# Patient Record
Sex: Female | Born: 1975 | Race: White | Hispanic: No | Marital: Married | Smoking: Former smoker
Health system: Southern US, Community
[De-identification: ages and names within clinical notes are randomized; demographics above are authoritative.]

## PROBLEM LIST (undated history)

## (undated) DIAGNOSIS — C50919 Malignant neoplasm of unspecified site of unspecified female breast: Secondary | ICD-10-CM

## (undated) DIAGNOSIS — R87619 Unspecified abnormal cytological findings in specimens from cervix uteri: Secondary | ICD-10-CM

## (undated) DIAGNOSIS — M1990 Unspecified osteoarthritis, unspecified site: Secondary | ICD-10-CM

## (undated) DIAGNOSIS — A64 Unspecified sexually transmitted disease: Secondary | ICD-10-CM

## (undated) DIAGNOSIS — F32A Depression, unspecified: Secondary | ICD-10-CM

## (undated) DIAGNOSIS — C801 Malignant (primary) neoplasm, unspecified: Secondary | ICD-10-CM

## (undated) DIAGNOSIS — R32 Unspecified urinary incontinence: Secondary | ICD-10-CM

## (undated) DIAGNOSIS — F329 Major depressive disorder, single episode, unspecified: Secondary | ICD-10-CM

## (undated) DIAGNOSIS — J189 Pneumonia, unspecified organism: Secondary | ICD-10-CM

## (undated) DIAGNOSIS — Z8719 Personal history of other diseases of the digestive system: Secondary | ICD-10-CM

## (undated) DIAGNOSIS — F419 Anxiety disorder, unspecified: Secondary | ICD-10-CM

## (undated) DIAGNOSIS — J45909 Unspecified asthma, uncomplicated: Secondary | ICD-10-CM

## (undated) DIAGNOSIS — K219 Gastro-esophageal reflux disease without esophagitis: Secondary | ICD-10-CM

## (undated) HISTORY — DX: Unspecified urinary incontinence: R32

## (undated) HISTORY — DX: Malignant neoplasm of unspecified site of unspecified female breast: C50.919

## (undated) HISTORY — PX: CERVICAL BIOPSY  W/ LOOP ELECTRODE EXCISION: SUR135

## (undated) HISTORY — PX: BREAST RECONSTRUCTION: SHX9

## (undated) HISTORY — DX: Unspecified abnormal cytological findings in specimens from cervix uteri: R87.619

## (undated) HISTORY — DX: Depression, unspecified: F32.A

## (undated) HISTORY — PX: TOTAL VAGINAL HYSTERECTOMY: SHX2548

## (undated) HISTORY — DX: Major depressive disorder, single episode, unspecified: F32.9

## (undated) HISTORY — PX: COLPOSCOPY: SHX161

## (undated) HISTORY — DX: Unspecified sexually transmitted disease: A64

---

## 2011-12-08 DIAGNOSIS — B977 Papillomavirus as the cause of diseases classified elsewhere: Secondary | ICD-10-CM | POA: Insufficient documentation

## 2013-06-09 DIAGNOSIS — C50919 Malignant neoplasm of unspecified site of unspecified female breast: Secondary | ICD-10-CM

## 2013-06-09 HISTORY — DX: Malignant neoplasm of unspecified site of unspecified female breast: C50.919

## 2014-06-09 DIAGNOSIS — R87619 Unspecified abnormal cytological findings in specimens from cervix uteri: Secondary | ICD-10-CM

## 2014-06-09 HISTORY — DX: Unspecified abnormal cytological findings in specimens from cervix uteri: R87.619

## 2015-06-10 HISTORY — PX: MASTECTOMY: SHX3

## 2016-02-08 DIAGNOSIS — Z90710 Acquired absence of both cervix and uterus: Secondary | ICD-10-CM | POA: Insufficient documentation

## 2017-10-29 DIAGNOSIS — C50919 Malignant neoplasm of unspecified site of unspecified female breast: Secondary | ICD-10-CM | POA: Insufficient documentation

## 2017-10-29 DIAGNOSIS — Z1502 Genetic susceptibility to malignant neoplasm of ovary: Secondary | ICD-10-CM

## 2017-10-29 DIAGNOSIS — Z1509 Genetic susceptibility to other malignant neoplasm: Secondary | ICD-10-CM

## 2017-10-29 DIAGNOSIS — Z1589 Genetic susceptibility to other disease: Secondary | ICD-10-CM

## 2017-11-19 DIAGNOSIS — K760 Fatty (change of) liver, not elsewhere classified: Secondary | ICD-10-CM | POA: Insufficient documentation

## 2017-11-19 DIAGNOSIS — E669 Obesity, unspecified: Secondary | ICD-10-CM | POA: Insufficient documentation

## 2017-11-30 DIAGNOSIS — J309 Allergic rhinitis, unspecified: Secondary | ICD-10-CM | POA: Insufficient documentation

## 2017-11-30 DIAGNOSIS — R12 Heartburn: Secondary | ICD-10-CM | POA: Insufficient documentation

## 2018-02-25 LAB — TSH: TSH: 2.34 (ref 0.41–5.90)

## 2018-02-25 LAB — HEMOGLOBIN A1C: Hemoglobin A1C: 5.1

## 2018-05-10 ENCOUNTER — Encounter: Payer: Self-pay | Admitting: "Endocrinology

## 2018-05-10 ENCOUNTER — Ambulatory Visit (INDEPENDENT_AMBULATORY_CARE_PROVIDER_SITE_OTHER): Payer: 59 | Admitting: "Endocrinology

## 2018-05-10 VITALS — BP 131/83 | HR 80 | Ht 68.5 in | Wt 252.0 lb

## 2018-05-10 DIAGNOSIS — E049 Nontoxic goiter, unspecified: Secondary | ICD-10-CM | POA: Diagnosis not present

## 2018-05-10 NOTE — Progress Notes (Signed)
Endocrinology Consult Note                                            05/10/2018, 10:38 AM   Subjective:    Patient ID: Linda Gutierrez, female    DOB: 1975-12-25, PCP Loman Brooklyn, FNP   Past Medical History:  Diagnosis Date  . Breast cancer [facility])    Past Surgical History:  Procedure Laterality Date  . BREAST RECONSTRUCTION    . MASTECTOMY    . TOTAL VAGINAL HYSTERECTOMY     Social History   Socioeconomic History  . Marital status: Married    Spouse name: Not on file  . Number of children: Not on file  . Years of education: Not on file  . Highest education level: Not on file  Occupational History  . Not on file  Social Needs  . Financial resource strain: Not on file  . Food insecurity:    Worry: Not on file    Inability: Not on file  . Transportation needs:    Medical: Not on file    Non-medical: Not on file  Tobacco Use  . Smoking status: Former Smoker    Packs/day: 0.50    Years: 10.00    Pack years: 5.00    Types: Cigarettes    Last attempt to quit: 2015    Years since quitting: 4.9  . Smokeless tobacco: Never Used  Substance and Sexual Activity  . Alcohol use: Yes    Comment: ocassional  . Drug use: Never  . Sexual activity: Not on file  Lifestyle  . Physical activity:    Days per week: Not on file    Minutes per session: Not on file  . Stress: Not on file  Relationships  . Social connections:    Talks on phone: Not on file    Gets together: Not on file    Attends religious service: Not on file    Active member of club or organization: Not on file    Attends meetings of clubs or organizations: Not on file    Relationship status: Not on file  Other Topics Concern  . Not on file  Social History Narrative  . Not on file   Outpatient Encounter Medications as of 05/10/2018  Medication Sig  . sertraline (ZOLOFT) 50 MG tablet Take by mouth daily.   No facility-administered encounter medications on file as of 05/10/2018.     ALLERGIES: No Known Allergies  VACCINATION STATUS:  There is no immunization history on file for this patient.  HPI Linda Gutierrez is 42 y.o. female who presents today with a medical history as above. she is being seen in consultation for abnormal thyroid ultrasound requested by Loman Brooklyn, FNP.  she reports that approximately 6 weeks ago she did have anterior lower neck pain associated with tenderness all over the thyroid area.  She underwent thyroid function test which were within normal limits.  On March 05, 2018 she underwent thyroid ultrasound which showed mild goiter with heterogeneous background of the thyroid with no discrete nodules.   -She did not require any steroid intervention for neck pain which resolved over a period  of time. She has family history of thyroid dysfunction.  She denies dysphagia, shortness of breath, nor voice change.  She denies palpitations, tremors, heat/cold intolerance.    Review of  Systems  Constitutional: + steady  weight, no fatigue, no subjective hyperthermia, no subjective hypothermia Eyes: no blurry vision, no xerophthalmia ENT: no sore throat, no nodules palpated in throat, no dysphagia/odynophagia, no hoarseness Cardiovascular: no Chest Pain, no Shortness of Breath, no palpitations, no leg swelling Respiratory: no cough, no SOB Gastrointestinal: no Nausea/Vomiting/Diarhhea Musculoskeletal: no muscle/joint aches Skin: no rashes Neurological: no tremors, no numbness, no tingling, no dizziness Psychiatric: no depression, no anxiety  Objective:    BP 131/83   Pulse 80   Ht 5' 8.5" (1.74 m)   Wt 252 lb (114.3 kg)   BMI 37.76 kg/m   Wt Readings from Last 3 Encounters:  05/10/18 252 lb (114.3 kg)    Physical Exam  Constitutional:  + Obese for height, not in acute distress, normal state of mind Eyes: PERRLA, EOMI, no exophthalmos ENT: moist mucous membranes, no thyromegaly, no cervical lymphadenopathy Cardiovascular: normal  precordial activity, Regular Rate and Rhythm, no Murmur/Rubs/Gallops Respiratory:  adequate breathing efforts, no gross chest deformity, Clear to auscultation bilaterally Gastrointestinal: abdomen soft, Non -tender, No distension, Bowel Sounds present Musculoskeletal: no gross deformities, strength intact in all four extremities Skin: moist, warm, no rashes Neurological: no tremor with outstretched hands, Deep tendon reflexes normal in all four extremities.   February 25, 2018 labs: Free T4 1.3 2, TSH 2.3 4, free T3 3.1 A1c 5.1%       Assessment & Plan:   1. Goiter  - Linda Gutierrez  is being seen at a kind request of Loman Brooklyn, FNP. - I have reviewed her available thyroid records and clinically evaluated the patient. - Based on reviews, she has mild goiter with heterogeneous echotexture.  She likely had subacute thyroiditis which has resolved. -She will not require antithyroid intervention at this time.  -She will need repeat thyroid function test and office visit in 6 months.  If next physical exam reveals enlarging goiter, she will be considered for repeat thyroid sonogram a year from the last one.  -She has A1c of 5.1% indicating absence of prediabetes/diabetes. - I did not initiate any new prescriptions today.  - I advised her  to maintain close follow up with Loman Brooklyn, FNP for primary care needs.   - Time spent with the patient: 25 minutes, of which >50% was spent in obtaining information about her symptoms, reviewing her previous labs, evaluations, and treatments, counseling her about her abnormal thyroid ultrasound, and developing a plan on how to follow-up.   Hera Ortlieb participated in the discussions, expressed understanding, and voiced agreement with the above plans.  All questions were answered to her satisfaction. she is encouraged to contact clinic should she have any questions or concerns prior to her return visit.  Follow up plan: Return in  about 6 months (around 11/09/2018) for Follow up with Pre-visit Labs.   [name], MD [facility] [address] [place], [place] [address] Phone: [phone]  Fax: [phone]     05/10/2018, 10:38 AM  This note was partially dictated with voice recognition software. Similar sounding words can be transcribed inadequately or may not  be corrected upon review.

## 2018-06-07 ENCOUNTER — Other Ambulatory Visit: Payer: Self-pay

## 2018-06-07 ENCOUNTER — Encounter: Payer: Self-pay | Admitting: Rehabilitation

## 2018-06-07 ENCOUNTER — Ambulatory Visit: Payer: 59 | Attending: Family Medicine | Admitting: Rehabilitation

## 2018-06-07 DIAGNOSIS — R208 Other disturbances of skin sensation: Secondary | ICD-10-CM

## 2018-06-07 DIAGNOSIS — I972 Postmastectomy lymphedema syndrome: Secondary | ICD-10-CM | POA: Diagnosis not present

## 2018-06-07 DIAGNOSIS — M79601 Pain in right arm: Secondary | ICD-10-CM | POA: Insufficient documentation

## 2018-06-07 NOTE — Patient Instructions (Signed)
Given medbridge ulnar butterfly stretch and median nerve flossing Will set up compression through either [place] or a special place

## 2018-06-07 NOTE — Therapy (Signed)
[place], [place], [address] Phone: [phone]   Fax:  [phone]  Physical Therapy Evaluation  Patient Details  Name: Linda Gutierrez MRN: [id number] Date of Birth: 27-Nov-1975 Referring Provider (PT): Hendricks Limes FNP   Encounter Date: 06/07/2018  PT End of Session - 06/07/18 0949    Visit Number  1    Number of Visits  8    Date for PT Re-Evaluation  07/05/18    PT Start Time  0800    PT Stop Time  0845    PT Time Calculation (min)  45 min    Activity Tolerance  Patient tolerated treatment well    Behavior During Therapy  [facility] for tasks assessed/performed       Past Medical History:  Diagnosis Date  . Breast cancer [facility])     Past Surgical History:  Procedure Laterality Date  . BREAST RECONSTRUCTION    . MASTECTOMY    . TOTAL VAGINAL HYSTERECTOMY      There were no vitals filed for this visit.   Subjective Assessment - 06/07/18 0803    Subjective  Pain started around Thanksgiving in the UT region and then going down the arm.  It was really tight and heavy and then the heaviness started.  The swelling has gone down but I still have alot of nerve pain.  I can't feel the tip of my pointer finger and now I feel like I have alot of nerve pain.  The swelling feels down.  Maybe some pressure in the axilla. Did not check for DVT.      Patient is accompained by:  Family member    Pertinent History  R sided estrogen positive breast cancer, Her2negative with bilateral mastectomy in 2015 with implant reconstruction.  3 nodes removed on the Rt.  1/3 nodes positive.  All done in [place].  Hysterctomy 2017.  Chemotherapy complete no radiation    Limitations  --   none really but trying not to lift anything heavy   Diagnostic tests  none taken     Patient Stated Goals  what to do for pain and swelling     Currently in Pain?  Yes   pressure under the axilla    Pain Score  2     Pain Location  Hand     Pain Orientation  Right    Pain Descriptors / Indicators  Aching;Discomfort    Pain Type  Neuropathic pain    Pain Onset  More than a month ago    Pain Frequency  Constant    Aggravating Factors   sitting at the desk, driving, whenever the arm is bent    Pain Relieving Factors  lifting it up           [facility] PT Assessment - 06/07/18 0001      Assessment   Medical Diagnosis  Rt breast cancer    Referring Provider (PT)  Hendricks Limes FNP    Onset Date/Surgical Date  06/09/13   swelling around 05/03/18   Hand Dominance  Left    Next MD Visit  as needed    Prior Therapy  no      Precautions   Precaution Comments  history of cancer, lymphedema Rt      Restrictions   Weight Bearing Restrictions  No      Balance Screen   Has the patient fallen in the past 6 months  No    Has the patient  had a decrease in activity level because of a fear of falling?   No    Is the patient reluctant to leave their home because of a fear of falling?   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    Leisure  low impact aerobics and weight lifting      Cognition   Overall Cognitive Status  Within Functional Limits for tasks assessed      Observation/Other Assessments   Observations  no obvious swelling seated, well healed incisions, good implant status      Coordination   Gross Motor Movements are Fluid and Coordinated  Yes      Posture/Postural Control   Posture/Postural Control  Postural limitations    Postural Limitations  Rounded Shoulders;Forward head      ROM / Strength   AROM / PROM / Strength  AROM;PROM      AROM   AROM Assessment Site  Shoulder    Right/Left Shoulder   Right;Left    Right Shoulder Flexion  145 Degrees    Right Shoulder ABduction  155 Degrees    Right Shoulder Internal Rotation  --   behind the back painful*   Right Shoulder External Rotation  --   behind the head full and feels good    Left Shoulder Flexion  155 Degrees    Left Shoulder ABduction  177 Degrees    Left Shoulder Internal Rotation  --   behind the back WNL   Left Shoulder External Rotation  --   behind the head WNL     PROM   Overall PROM Comments  Rt shoulder: WNL but with puffiness evident in overhead flexion.  "Its making my nerves angry"      Palpation   Palpation comment  no sig ttp Rt UT        LYMPHEDEMA/ONCOLOGY QUESTIONNAIRE - 06/07/18 0811      Type   Cancer Type  Rt breast      Surgeries   Mastectomy Date  --   2015   Saline Implant Reconstruction Date  --   2015   Sentinel Lymph Node Biopsy Date  --   2015   Number Lymph Nodes Removed  3   1/3     Date Lymphedema/Swelling Started   Date  05/03/18      Treatment   Active Chemotherapy Treatment  No    Past Chemotherapy Treatment  Yes    Active Radiation Treatment  No    Past Radiation Treatment  No    Current Hormone Treatment  --   unknown     What other symptoms do you have   Are you Having Heaviness or Tightness  Yes    Are you having Pain  Yes      Lymphedema Assessments   Lymphedema Assessments  Upper extremities      Right Upper Extremity Lymphedema   At Axilla   42 cm    15 cm Proximal to Olecranon Process  35.9 cm    10 cm Proximal to Olecranon Process  33.9 cm  Olecranon Process  30.9 cm    10 cm Proximal to Ulnar Styloid Process  26 cm    Just Proximal to Ulnar Styloid Process  18.2 cm    Across Hand at [employer]  21.8 cm    At Alex of 2nd Digit  6.7 cm      Left Upper Extremity Lymphedema   At Axilla   42 cm    15 cm Proximal to Olecranon Process  35.5 cm    10 cm Proximal to Olecranon Process  32.7 cm    Olecranon Process  31.2 cm    10 cm Proximal to  Ulnar Styloid Process  26.9 cm    Just Proximal to Ulnar Styloid Process  18.3 cm    Across Hand at [employer]  21.7 cm    At [place] of 2nd Digit  7.2 cm             Objective measurements completed on examination: See above findings.      [place] Adult PT Treatment/Exercise - 06/07/18 0001      Exercises   Exercises  Other Exercises    Other Exercises   given ulnar nerve butterfly stretch and median nerve flossing medbridge       Manual Therapy   Manual therapy comments  brief MLD session; short neck, Rt axillary nodes, Lt inguinal nodes, Rt axillary nodes, work at the Rt upper arm/shoulder/axilla towards both pathways and briefly in Lt sidelying with arm overhead axillary work             PT Education - 06/07/18 0949    Education Details  POC, home exercises, lymphedema     Person(s) Educated  Patient    Methods  Explanation;Handout;Verbal cues;Tactile cues;Demonstration    Comprehension  Verbalized understanding;Returned demonstration          PT Long Term Goals - 06/07/18 0959      PT LONG TERM GOAL #1   Title  Pt will get set up with appropriate compression for as needed Rt UE lymphedema and be knowledgeable about home use    Time  4    Period  Weeks    Status  New    Target Date  07/05/18      PT LONG TERM GOAL #2   Title  Pt will improve Rt shoulder ROM to equal to the Lt and without nerve pain    Time  4    Period  Weeks    Status  New    Target Date  07/05/18      PT LONG TERM GOAL #3   Title  Pt will return to 0/10 resting UE pain in the Rt arm and hand     Time  4    Period  Weeks    Status  New    Target Date  07/05/18             Plan - 06/07/18 0949    Clinical Impression Statement  Pt presents with gradual onset of Rt UT pain and then pain into the arm followed but mild swelling and numbness and tingling.  Pt reports that her daughter helped her with some MLD and the swelling and mostly gone away except for the armpit.   Daughter not trained in MLD but attempted.  Deficits remaining include axillary and possibly shoulder edema, ulnar and median nerve restriction with pain down the arm into the fingers, decreased work tolerance, decreased Rt shoulder ROM, and  need for appropriate compression.  Pt ordered a sleeve on [employer] but reports it is uncomfortable.      History and Personal Factors relevant to plan of care:  R sided estrogen positive breast cancer, Her2negative with bilateral mastectomy in 2015 with implant reconstruction.  3 nodes removed on the Rt.  1/3 nodes positive.  All done in [place].  Hysterctomy 2017.  Chemotherapy complete no radiation    Clinical Presentation  Evolving    Clinical Presentation due to:  new onset swelling    Clinical Decision Making  Moderate    Rehab Potential  Excellent    PT Frequency  2x / week    PT Duration  4 weeks    PT Treatment/Interventions  ADLs/Self Care Home Management;Manual lymph drainage;Compression bandaging;Passive range of motion;Taping    PT Next Visit Plan  script back for compression sleeve and shirt? tell pt how to schedule with [facility] or a special place/show her what to get online if the price is too high ,continue Rt UE MLD with axillary and upper arm focus, Rt PROM shoulder, gentle nerve flossing ulanr and median     Recommended Other Services  script sent to [employer] 06/07/18.  Pt prefers to do [place] or a special place for insurance but will show how to order online if too expensive.  Thinking compression tshirt and bella lite or juzo soft sleeve    Consulted and Agree with Plan of Care  Patient;Family member/caregiver       Patient will benefit from skilled therapeutic intervention in order to improve the following deficits and impairments:  Impaired sensation, Decreased knowledge of precautions, Decreased range of motion, Increased edema, Decreased knowledge of use of DME, Pain, Postural dysfunction  Visit Diagnosis: Postmastectomy  lymphedema  Pain in right arm  Other disturbances of skin sensation     Problem List Patient Active Problem List   Diagnosis Date Noted  . Goiter 05/10/2018   Shan Levans, PT 06/07/2018, 10:02 AM  [place], [place], [address] Phone: [phone]   Fax:  [phone]  Name: Liyanna Cartwright MRN: [id number] Date of Birth: April 01, 1976

## 2018-06-14 ENCOUNTER — Ambulatory Visit: Payer: 59 | Attending: Family Medicine | Admitting: Rehabilitation

## 2018-06-14 DIAGNOSIS — I972 Postmastectomy lymphedema syndrome: Secondary | ICD-10-CM | POA: Insufficient documentation

## 2018-06-14 DIAGNOSIS — M79601 Pain in right arm: Secondary | ICD-10-CM | POA: Insufficient documentation

## 2018-06-14 DIAGNOSIS — R208 Other disturbances of skin sensation: Secondary | ICD-10-CM | POA: Insufficient documentation

## 2018-06-17 ENCOUNTER — Ambulatory Visit: Payer: 59

## 2018-06-17 DIAGNOSIS — I972 Postmastectomy lymphedema syndrome: Secondary | ICD-10-CM

## 2018-06-17 DIAGNOSIS — R208 Other disturbances of skin sensation: Secondary | ICD-10-CM | POA: Diagnosis present

## 2018-06-17 DIAGNOSIS — M79601 Pain in right arm: Secondary | ICD-10-CM

## 2018-06-17 NOTE — Therapy (Addendum)
[place], [place], [address] Phone: [phone]   Fax:  [phone]  Physical Therapy Treatment  Patient Details  Name: Linda Gutierrez MRN: [id number] Date of Birth: 01-09-1976 Referring Provider (PT): Hendricks Limes FNP   Encounter Date: 06/17/2018  PT End of Session - 06/17/18 0907    Visit Number  2    Number of Visits  8    Date for PT Re-Evaluation  07/05/18    PT Start Time  0803    PT Stop Time  0905    PT Time Calculation (min)  62 min    Activity Tolerance  Patient tolerated treatment well    Behavior During Therapy  [facility] for tasks assessed/performed       Past Medical History:  Diagnosis Date  . Breast cancer [facility])     Past Surgical History:  Procedure Laterality Date  . BREAST RECONSTRUCTION    . MASTECTOMY    . TOTAL VAGINAL HYSTERECTOMY      There were no vitals filed for this visit.  Subjective Assessment - 06/17/18 0815    Subjective  I am doing much better than I was when I was here last. I'm back to the gym but being cautious of what I'm doing, still weary though of how I should progress my exercises.     Pertinent History  R sided estrogen positive breast cancer, Her2negative with bilateral mastectomy in 2015 with implant reconstruction.  3 nodes removed on the Rt.  1/3 nodes positive.  All done in [place].  Hysterctomy 2017.  Chemotherapy complete no radiation    Patient Stated Goals  what to do for pain and swelling     Currently in Pain?  No/denies   just discomfort at my Rt lateral breast                      [facility] Adult PT Treatment/Exercise - 06/17/18 0001      Self-Care   Self-Care  Other Self-Care Comments    Other Self-Care Comments   Spent time educating pt and answering her questions about safe and slow progression of exercises while monitoring limb and how to for all. Issued ABC class packet. Also spent time discussing compression needs and pt is  going to contact her insurance to see what they will cover, if any, being beginning of year and decide how she will get a garments from that. In mean time instructed her that she could try a compression workout shirt from local sports store ([employer] or [employer]) and she is going to try that.       Shoulder Exercises: Supine   Horizontal ABduction  Both;5 reps;Theraband;Strengthening    Theraband Level (Shoulder Horizontal ABduction)  Level 2 (Red)    External Rotation  Strengthening;Both;5 reps;Theraband    Theraband Level (Shoulder External Rotation)  Level 2 (Red)    Flexion  Strengthening;Both;5 reps;Theraband   Narrow and Wide Grip, 5 times each   Theraband Level (Shoulder Flexion)  Level 2 (Red)    Diagonals  Strengthening;Right;Left;5 reps;Theraband    Theraband Level (Shoulder Diagonals)  Level 2 (Red)      Manual Therapy   Manual Therapy  Manual Lymphatic Drainage (MLD)    Manual Lymphatic Drainage (MLD)  Instructed pt per handout but only to upper arm.              PT Education - 06/17/18 0905    Education Details  Supine scapular series  and self manual lymph drainage; alos lymphedema risk reduction and issued ABC class packet    Person(s) Educated  Patient    Methods  Explanation;Demonstration;Handout    Comprehension  Verbalized understanding;Returned demonstration;Need further instruction          PT Long Term Goals - 06/07/18 0959      PT LONG TERM GOAL #1   Title  Pt will get set up with appropriate compression for as needed Rt UE lymphedema and be knowledgeable about home use    Time  4    Period  Weeks    Status  New    Target Date  07/05/18      PT LONG TERM GOAL #2   Title  Pt will improve Rt shoulder ROM to equal to the Lt and without nerve pain    Time  4    Period  Weeks    Status  New    Target Date  07/05/18      PT LONG TERM GOAL #3   Title  Pt will return to 0/10 resting UE pain in the Rt arm and hand     Time  4    Period  Weeks     Status  New    Target Date  07/05/18            Plan - 06/17/18 0908    Clinical Impression Statement  Pt returns with much resolution to her symptoms of swelling and nerve pain. She reports feeling much better after inital session and has been back to the gym but had questions about how to safely progress exercises. So instructed pt in risk reductions of lymphedema including how to safely and slowly progress exercises while being mindful of compromised limb. See flowsheet for other education. She verbalized understanding all. Also progressed HEP to inlcude supine scapular series with red theraband, but also issued green as probable pt will be ready to progress quickly. Overall she reports feeling very encouraged by todays session and shoulder felt even looser by end of session after stretching.     Rehab Potential  Excellent    PT Frequency  2x / week    PT Duration  4 weeks    PT Treatment/Interventions  ADLs/Self Care Home Management;Manual lymph drainage;Compression bandaging;Passive range of motion;Taping    PT Next Visit Plan  script back for compression sleeve and shirt? Continue Rt UE MLD with axillary and upper arm focus prn and review pts technique, Rt PROM shoulder and review HEP    PT Home Exercise Plan  supine scapular series and self manual lymph drainage    Consulted and Agree with Plan of Care  Patient       Patient will benefit from skilled therapeutic intervention in order to improve the following deficits and impairments:  Impaired sensation, Decreased knowledge of precautions, Decreased range of motion, Increased edema, Decreased knowledge of use of DME, Pain, Postural dysfunction  Visit Diagnosis: Postmastectomy lymphedema  Pain in right arm  Other disturbances of skin sensation     Problem List Patient Active Problem List   Diagnosis Date Noted  . Goiter 05/10/2018    Otelia Limes, PTA 06/17/2018, 9:27 AM  [place], [place], [address] Phone: [phone]   Fax:  [phone]  Name: Linda Gutierrez MRN: [id number] Date of Birth: Nov 27, 1975  PHYSICAL THERAPY DISCHARGE SUMMARY  Visits from Start of Care: 2  Current functional level related to goals /  functional outcomes: See above   Remaining deficits:    Education / Equipment: HEP Plan: Patient agrees to discharge.  Patient goals were partially met. Patient is being discharged due to being pleased with the current functional level.  ?????     Shan Levans, PT

## 2018-06-17 NOTE — Patient Instructions (Addendum)
Over Head Pull: Narrow and Wide Grip   Cancer Rehab [phone]   On back, knees bent, feet flat, band across thighs, elbows straight but relaxed. Pull hands apart (start). Keeping elbows straight, bring arms up and over head, hands toward floor. Keep pull steady on band. Hold momentarily. Return slowly, keeping pull steady, back to start. Then do same with a wider grip on the band (past shoulder width) Repeat _5-10__ times. Band color __red____   Side Pull: Double Arm   On back, knees bent, feet flat. Arms perpendicular to body, shoulder level, elbows straight but relaxed. Pull arms out to sides, elbows straight. Resistance band comes across collarbones, hands toward floor. Hold momentarily. Slowly return to starting position. Repeat _5-10__ times. Band color _red____   Sword   On back, knees bent, feet flat, left hand on left hip, right hand above left. Pull right arm DIAGONALLY (hip to shoulder) across chest. Bring right arm along head toward floor. Hold momentarily. Slowly return to starting position. Repeat _5-10__ times. Do with left arm. Band color _red_____   Shoulder Rotation: Double Arm   On back, knees bent, feet flat, elbows tucked at sides, bent 90, hands palms up. Pull hands apart and down toward floor, keeping elbows near sides. Hold momentarily. Slowly return to starting position. Repeat _5-10__ times. Band color __red____    Deep Effective Breath   Standing, sitting, or laying down, place both hands on the belly. Take a deep breath IN, expanding the belly; then breath OUT, contracting the belly. Repeat __5__ times. Do __2-3__ sessions per day and before your self massage.  Axilla to Axilla - Sweep   On uninvolved side make 5 circles in the armpit, then pump _5__ times from involved armpit across chest to uninvolved armpit, making a pathway. Do _1__ time per day.  Copyright  VHI. All rights reserved.  Axilla to Inguinal Nodes - Sweep   On involved side, make 5  circles at groin at panty line, then pump _5__ times from armpit along side of trunk to outer hip, making your other pathway. Do __1_ time per day.  Copyright  VHI. All rights reserved.  Arm Posterior: Elbow to Shoulder - Sweep   Pump _5__ times from back of elbow to top of shoulder. Then inner to outer upper arm _5_ times, then outer arm again _5_ times. Then back to the pathways _2-3_ times. Do _1__ time per day.  Copyright  VHI. All rights reserved.  ARM: [place]olar Wrist to Elbow - Sweep   Pump or stationary circles _5__ times from wrist to elbow making sure to do both sides of the forearm. Then retrace your steps to the outer arm, and the pathways _2-3_ times each. Do _1__ time per day.  Copyright  VHI. All rights reserved.  ARM: Dorsum of Hand to Shoulder - Sweep   Pump or stationary circles _5__ times on back of hand including knuckle spaces and individual fingers if needed working up towards the wrist, then retrace all your steps working back up the forearm, doing both sides; upper outer arm and back to your pathways _2-3_ times each. Then do 5 circles again at uninvolved armpit and involved groin where you started! Good job!! Do __1_ time per day.

## 2018-06-23 ENCOUNTER — Ambulatory Visit: Payer: 59 | Admitting: Rehabilitation

## 2018-06-28 ENCOUNTER — Encounter: Payer: 59 | Admitting: Rehabilitation

## 2018-06-30 ENCOUNTER — Encounter: Payer: 59 | Admitting: Rehabilitation

## 2018-07-21 ENCOUNTER — Encounter: Payer: Self-pay | Admitting: Certified Nurse Midwife

## 2018-07-30 ENCOUNTER — Encounter: Payer: Self-pay | Admitting: Certified Nurse Midwife

## 2018-07-30 ENCOUNTER — Ambulatory Visit: Payer: 59 | Admitting: Certified Nurse Midwife

## 2018-07-30 ENCOUNTER — Other Ambulatory Visit: Payer: Self-pay

## 2018-07-30 VITALS — BP 120/74 | HR 64 | Resp 16 | Ht 68.25 in | Wt 254.0 lb

## 2018-07-30 DIAGNOSIS — Z01419 Encounter for gynecological examination (general) (routine) without abnormal findings: Secondary | ICD-10-CM

## 2018-07-30 DIAGNOSIS — F32A Depression, unspecified: Secondary | ICD-10-CM | POA: Insufficient documentation

## 2018-07-30 DIAGNOSIS — Z8742 Personal history of other diseases of the female genital tract: Secondary | ICD-10-CM

## 2018-07-30 DIAGNOSIS — Z9889 Other specified postprocedural states: Secondary | ICD-10-CM

## 2018-07-30 DIAGNOSIS — Z853 Personal history of malignant neoplasm of breast: Secondary | ICD-10-CM | POA: Diagnosis not present

## 2018-07-30 DIAGNOSIS — Z8659 Personal history of other mental and behavioral disorders: Secondary | ICD-10-CM

## 2018-07-30 DIAGNOSIS — F329 Major depressive disorder, single episode, unspecified: Secondary | ICD-10-CM | POA: Insufficient documentation

## 2018-07-30 NOTE — Progress Notes (Signed)
43 y.o. G2P2002 Married  Caucasian Fe here to establish care and for annual gyn exam. Last exam 2017. Sees [facility] for management of allergies and anxiety/depression. Moved here from [place] one year ago to be closer to family. Occasional urinary incontinence, but no issues. Has son with her today. Right breast cancer history with bilateral mastectomy and reconstruction. Had TVH due to abnormal pap smear. LEEP procedure  did not resolve abnormality. Patient has both ovaries and has no hormonal issues at this point. Sees Oncology every year, and all stable at this point. No other health issues today.  No LMP recorded. Patient has had a hysterectomy.          Sexually active: Yes.    The current method of family planning is status post hysterectomy.    Exercising: Yes.    aerobic Smoker:  no  Review of Systems  Constitutional: Negative.   HENT: Negative.   Eyes: Negative.   Respiratory: Negative.   Cardiovascular: Negative.   Gastrointestinal: Negative.   Genitourinary: Negative.   Musculoskeletal: Negative.   Skin: Negative.   Neurological: Negative.   Endo/Heme/Allergies: Negative.   Psychiatric/Behavioral: Negative.     Health Maintenance: Pap:  8/17 abnormal History of Abnormal Pap: yes, hysterectomy MMG:  2015, breast cancer Self Breast exams: no Colonoscopy:  2016 BMD:   none TDaP:  2019 Shingles: no Pneumonia: no Hep C and HIV: both neg per patient Labs: with PCP and oncology   reports that she quit smoking about 5 years ago. Her smoking use included cigarettes. She has a 5.00 pack-year smoking history. She has never used smokeless tobacco. She reports current alcohol use of about 6.0 standard drinks of alcohol per week. She reports that she does not use drugs.  Past Medical History:  Diagnosis Date  . Abnormal Pap smear of cervix 2016   HPV  . Breast cancer ([place]) 2015   rt side  . Depression   . STD (sexually transmitted disease)    HPV  . Urinary  incontinence     Past Surgical History:  Procedure Laterality Date  . BREAST RECONSTRUCTION    . BREAST RECONSTRUCTION     with implants  . CERVICAL BIOPSY  W/ LOOP ELECTRODE EXCISION    . COLPOSCOPY    . MASTECTOMY  2017  . TOTAL VAGINAL HYSTERECTOMY      Current Outpatient Medications  Medication Sig Dispense Refill  . fluticasone (FLONASE) 50 MCG/ACT nasal spray 1 spray by Each Nare route daily.    . sertraline (ZOLOFT) 50 MG tablet Take by mouth daily.     No current facility-administered medications for this visit.     Family History  Problem Relation Age of Onset  . Thyroid disease Mother   . Diabetes Father   . Hypertension Father   . Lupus Father   . Diabetes Maternal Grandmother   . Heart disease Maternal Grandmother     ROS:  Pertinent items are noted in HPI.  Otherwise, a comprehensive ROS was negative.  Exam:   BP 120/74   Pulse 64   Resp 16   Ht 5' 8.25" (1.734 m)   Wt 254 lb (115.2 kg)   BMI 38.34 kg/m  Height: 5' 8.25" (173.4 cm) Ht Readings from Last 3 Encounters:  07/30/18 5' 8.25" (1.734 m)  05/10/18 5' 8.5" (1.74 m)    General appearance: alert, cooperative and appears stated age Head: Normocephalic, without obvious abnormality, atraumatic Neck: no adenopathy, supple, symmetrical, trachea midline and thyroid  normal to inspection and palpation Lungs: clear to auscultation bilaterally Breasts: normal appearance, no masses or tenderness, implants palpate intact, reconstruction scarring, axillary  nodes no enlargement Heart: regular rate and rhythm Abdomen: soft, non-tender; no masses,  no organomegaly Extremities: extremities normal, atraumatic, no cyanosis or edema Skin: Skin color, texture, turgor normal. No rashes or lesions Lymph nodes: Cervical, supraclavicular, and axillary nodes normal. No abnormal inguinal nodes palpated Neurologic: Grossly normal   Pelvic: External genitalia:  no lesions, normal female              Urethra:   normal appearing urethra with no masses, tenderness or lesions              Bartholin's and Skene's: normal                 Vagina: normal appearing vagina with normal color and discharge, no lesions              Cervix: absent              Pap taken: No. Bimanual Exam:  Uterus:  uterus absent              Adnexa: normal adnexa and no mass, fullness, tenderness               Rectovaginal: Confirms               Anus:  normal sphincter tone, no lesions  Chaperone present: yes  A:  Well Woman with normal exam  History of right breast cancer with bilateral mastectomy and reconstruction with implants, under oncology follow up  History of LEEP for abnormal pap, did not resolve, so TVH with ovaries retained  MD management of allergies and Zoloft     P:   Reviewed health and wellness pertinent to exam  Discussed SBE is still important due to lymph nodes presence. Patient aware. Continue oncology follow up  Continue MD follow up for medications as indicated  Pap smear: no  counseled on breast self exam, feminine hygiene, adequate intake of calcium and vitamin D, diet and exercise  return annually or prn  An After Visit Summary was printed and given to the patient.

## 2018-07-30 NOTE — Patient Instructions (Signed)

## 2018-11-12 ENCOUNTER — Ambulatory Visit: Payer: 59 | Admitting: "Endocrinology

## 2018-12-02 ENCOUNTER — Other Ambulatory Visit: Payer: Self-pay | Admitting: "Endocrinology

## 2018-12-02 ENCOUNTER — Telehealth: Payer: Self-pay | Admitting: "Endocrinology

## 2018-12-02 DIAGNOSIS — E049 Nontoxic goiter, unspecified: Secondary | ICD-10-CM

## 2018-12-02 NOTE — Telephone Encounter (Signed)
There is no broad test for autoimmune disorders. These tests need to be specific.   I have added some more tests to her existing thyroid labs to see if she has  antithyroid antibodies.

## 2018-12-02 NOTE — Telephone Encounter (Signed)
VM left for pt notifying her of test for Thyroid disorder.

## 2018-12-02 NOTE — Telephone Encounter (Signed)
Pt called regarding her labs that she is going to have. She wants to know if Dr Dorris Fetch would add a lab for " auto immune disorder " please advise

## 2018-12-03 ENCOUNTER — Ambulatory Visit: Payer: 59 | Admitting: "Endocrinology

## 2018-12-20 LAB — THYROGLOBULIN ANTIBODY: Thyroglobulin Ab: <1 [IU]/mL (ref <=1)

## 2018-12-20 LAB — THYROID PEROXIDASE ANTIBODY: Thyroperoxidase Ab SerPl-aCnc: 2 IU/mL (ref <9)

## 2018-12-29 ENCOUNTER — Ambulatory Visit: Payer: 59 | Admitting: "Endocrinology

## 2019-01-08 LAB — TSH: TSH: 2.6 mIU/L

## 2019-01-08 LAB — T4, FREE: Free T4: 1.2 ng/dL (ref 0.8–1.8)

## 2019-01-14 ENCOUNTER — Encounter: Payer: Self-pay | Admitting: "Endocrinology

## 2019-01-14 ENCOUNTER — Ambulatory Visit (INDEPENDENT_AMBULATORY_CARE_PROVIDER_SITE_OTHER): Payer: 59 | Admitting: "Endocrinology

## 2019-01-14 ENCOUNTER — Other Ambulatory Visit: Payer: Self-pay

## 2019-01-14 DIAGNOSIS — E049 Nontoxic goiter, unspecified: Secondary | ICD-10-CM

## 2019-01-14 NOTE — Progress Notes (Signed)
01/14/2019, 1:13 PM                                Endocrinology Telehealth Visit Follow up Note -During COVID -19 Pandemic  I connected with Linda Gutierrez on 01/14/2019   by telephone and verified that I am speaking with the correct person using two identifiers. Linda Gutierrez, [birth year]. she has verbally consented to this visit. All issues noted in this document were discussed and addressed. The format was not optimal for physical exam.   Subjective:    Patient ID: Linda Gutierrez, female    DOB: [birth year], PCP Linda Brooklyn, FNP   Past Medical History:  Diagnosis Date  . Abnormal Pap smear of cervix 2016   HPV  . Breast cancer ([place]) 2015   rt side  . Depression   . STD (sexually transmitted disease)    HPV  . Urinary incontinence    Past Surgical History:  Procedure Laterality Date  . BREAST RECONSTRUCTION    . BREAST RECONSTRUCTION     with implants  . CERVICAL BIOPSY  W/ LOOP ELECTRODE EXCISION    . COLPOSCOPY    . MASTECTOMY  2017  . TOTAL VAGINAL HYSTERECTOMY     Social History   Socioeconomic History  . Marital status: Married    Spouse name: Not on file  . Number of children: Not on file  . Years of education: Not on file  . Highest education level: Not on file  Occupational History  . Not on file  Social Needs  . Financial resource strain: Not on file  . Food insecurity    Worry: Not on file    Inability: Not on file  . Transportation needs    Medical: Not on file    Non-medical: Not on file  Tobacco Use  . Smoking status: Former Smoker    Packs/day: 0.50    Years: 10.00    Pack years: 5.00    Types: Cigarettes    Quit date: 2015    Years since quitting: 5.6  . Smokeless tobacco: Never Used  Substance and Sexual Activity  . Alcohol use: Yes    Alcohol/week: 6.0 standard drinks    Types: 6 Standard drinks or equivalent per week  . Drug use: Never  . Sexual activity: Yes    Partners: Male     Birth control/protection: Surgical    Comment: hysterectomy 2017  Lifestyle  . Physical activity    Days per week: Not on file    Minutes per session: Not on file  . Stress: Not on file  Relationships  . Social Herbalist on phone: Not on file    Gets together: Not on file    Attends religious service: Not on file    Active member of club or organization: Not on file    Attends meetings of clubs or organizations: Not on file    Relationship status: Not on file  Other Topics Concern  . Not on file  Social History Narrative  . Not on file   Outpatient Encounter Medications as of 01/14/2019  Medication Sig  . fluticasone (FLONASE) 50 MCG/ACT nasal spray  1 spray by Each Nare route daily.  . sertraline (ZOLOFT) 50 MG tablet Take by mouth daily.   No facility-administered encounter medications on file as of 01/14/2019.    ALLERGIES: No Known Allergies  VACCINATION STATUS: Immunization History  Administered Date(s) Administered  . Influenza-Unspecified 06/14/2018  . Tdap 11/30/2017    HPI [name] is 42 y.o. female who presents today with a medical history as above. she is being engaged in telehealth via telephone in follow-up after she was seen in consultation for abnormal thyroid ultrasound requested by Linda Brooklyn, FNP.  she has had thyroiditis which did not require intervention.  Her previsit thyroid function tests are within normal limits.  She has no new complaints.    On March 05, 2018 she underwent thyroid ultrasound which showed mild goiter with heterogeneous background of the thyroid with no discrete nodules.    She has family history of thyroid dysfunction.  She denies dysphagia, shortness of breath, nor voice change.  She denies palpitations, tremors, heat/cold intolerance.    Review of Systems Limited as above.  Objective:    There were no vitals taken for this visit.  Wt Readings from Last 3 Encounters:  07/30/18 254 lb (115.2 kg)   05/10/18 252 lb (114.3 kg)    Recent Results (from the past 2160 hour(s))  Thyroid peroxidase antibody     Status: None   Collection Time: 12/17/18  1:07 PM  Result Value Ref Range   Thyroperoxidase Ab SerPl-aCnc 2 <9 IU/mL  Thyroglobulin antibody     Status: None   Collection Time: 12/17/18  1:07 PM  Result Value Ref Range   Thyroglobulin Ab <1 < or = 1 IU/mL  TSH     Status: None   Collection Time: 01/07/19  1:14 PM  Result Value Ref Range   TSH 2.60 mIU/L    Comment:           Reference Range .           > or = 20 Years  0.40-4.50 .                Pregnancy Ranges           First trimester    0.26-2.66           Second trimester   0.55-2.73           Third trimester    0.43-2.91   T4, free     Status: None   Collection Time: 01/07/19  1:14 PM  Result Value Ref Range   Free T4 1.2 0.8 - 1.8 ng/dL     February 25, 2018 labs: Free T4 1.3 2, TSH 2.3 4, free T3 3.1 A1c 5.1%       Assessment & Plan:   1. Goiter  -Her previsit thyroid function tests are within normal limits.  She will not require antithyroid intervention nor thyroid hormone supplements.  She will however need repeat thyroid ultrasound before her next visit in 6 months, and clinical exam.  She will have thyroid function tests the same time -She has A1c of 5.1% indicating absence of prediabetes/diabetes.  - I advised her  to maintain close follow up with Linda Brooklyn, FNP for primary care needs.  Time for this visit: 15 minutes. Kyleena Hlavaty  participated in the discussions, expressed understanding, and voiced agreement with the above plans.  All questions were answered to her satisfaction. she is encouraged to contact clinic should she have any  questions or concerns prior to her return visit.   Follow up plan: Return in about 6 months (around 07/17/2019), or u/s in [facility] in Feb 2021, for Thyroid / Neck Ultrasound, Follow up with Pre-visit Labs.   [name], MD [facility]  [facility] [address] [place], [place] [address] Phone: [phone]  Fax: [phone]     01/14/2019, 1:13 PM  This note was partially dictated with voice recognition software. Similar sounding words can be transcribed inadequately or may not  be corrected upon review.

## 2019-07-22 ENCOUNTER — Ambulatory Visit: Payer: 59 | Admitting: "Endocrinology

## 2019-07-25 ENCOUNTER — Telehealth: Payer: Self-pay | Admitting: Certified Nurse Midwife

## 2019-07-25 NOTE — Telephone Encounter (Signed)
Left message regarding upcoming appointment has been canceled and needs to be rescheduled. °

## 2019-08-05 ENCOUNTER — Ambulatory Visit: Payer: 59 | Admitting: Certified Nurse Midwife

## 2019-08-06 LAB — TSH: TSH: 2.39 mIU/L

## 2019-08-06 LAB — T4, FREE: Free T4: 1.1 ng/dL (ref 0.8–1.8)

## 2019-08-17 ENCOUNTER — Ambulatory Visit (INDEPENDENT_AMBULATORY_CARE_PROVIDER_SITE_OTHER): Payer: 59 | Admitting: "Endocrinology

## 2019-08-17 ENCOUNTER — Encounter: Payer: Self-pay | Admitting: "Endocrinology

## 2019-08-17 ENCOUNTER — Ambulatory Visit: Payer: 59 | Admitting: "Endocrinology

## 2019-08-17 DIAGNOSIS — E049 Nontoxic goiter, unspecified: Secondary | ICD-10-CM

## 2019-08-17 NOTE — Progress Notes (Signed)
08/17/2019, 4:55 PM                                   Endocrinology Telehealth Visit Follow up Note -During COVID -19 Pandemic  I connected with Linda Gutierrez on 08/17/2019   by telephone and verified that I am speaking with the correct person using two identifiers. Linda Gutierrez, [birth year]. she has verbally consented to this visit. All issues noted in this document were discussed and addressed. The format was not optimal for physical exam.   Subjective:    Patient ID: Linda Gutierrez, female    DOB: Feb 25, 1976, PCP Patient, No Pcp Per   Past Medical History:  Diagnosis Date  . Abnormal Pap smear of cervix 2016   HPV  . Breast cancer ([place]) 2015   rt side  . Depression   . STD (sexually transmitted disease)    HPV  . Urinary incontinence    Past Surgical History:  Procedure Laterality Date  . BREAST RECONSTRUCTION    . BREAST RECONSTRUCTION     with implants  . CERVICAL BIOPSY  W/ LOOP ELECTRODE EXCISION    . COLPOSCOPY    . MASTECTOMY  2017  . TOTAL VAGINAL HYSTERECTOMY     Social History   Socioeconomic History  . Marital status: Married    Spouse name: Not on file  . Number of children: Not on file  . Years of education: Not on file  . Highest education level: Not on file  Occupational History  . Not on file  Tobacco Use  . Smoking status: Former Smoker    Packs/day: 0.50    Years: 10.00    Pack years: 5.00    Types: Cigarettes    Quit date: 2015    Years since quitting: 6.1  . Smokeless tobacco: Never Used  Substance and Sexual Activity  . Alcohol use: Yes    Alcohol/week: 6.0 standard drinks    Types: 6 Standard drinks or equivalent per week  . Drug use: Never  . Sexual activity: Yes    Partners: Male    Birth control/protection: Surgical    Comment: hysterectomy 2017  Other Topics Concern  . Not on file  Social History Narrative  . Not on file   Social Determinants of Health   Financial  Resource Strain:   . Difficulty of Paying Living Expenses: Not on file  Food Insecurity:   . Worried About Charity fundraiser in the Last Year: Not on file  . Ran Out of Food in the Last Year: Not on file  Transportation Needs:   . Lack of Transportation (Medical): Not on file  . Lack of Transportation (Non-Medical): Not on file  Physical Activity:   . Days of Exercise per Week: Not on file  . Minutes of Exercise per Session: Not on file  Stress:   . Feeling of Stress : Not on file  Social Connections:   . Frequency of Communication with Friends and Family: Not on file  . Frequency of Social Gatherings with Friends and Family: Not on file  . Attends Religious Services: Not on file  . Active Member of Clubs or Organizations:  Not on file  . Attends Archivist Meetings: Not on file  . Marital Status: Not on file   Outpatient Encounter Medications as of 08/17/2019  Medication Sig  . fluticasone (FLONASE) 50 MCG/ACT nasal spray 1 spray by Each Nare route daily.  . sertraline (ZOLOFT) 50 MG tablet Take by mouth daily.   No facility-administered encounter medications on file as of 08/17/2019.   ALLERGIES: No Known Allergies  VACCINATION STATUS: Immunization History  Administered Date(s) Administered  . Influenza-Unspecified 06/14/2018  . Tdap 11/30/2017    HPI Linda Gutierrez is 44 y.o. female who presents today with a medical history as above. she is being engaged in telehealth via telephone in follow-up for goiter with repeat thyroid ultrasound.  Prior to her last visit,she has had thyroiditis which did not require intervention.  Her previsit thyroid function tests are still consistent with euthyroid state.   She has no new complaints.    On March 05, 2018 she underwent thyroid ultrasound which showed mild goiter with heterogeneous background of the thyroid with no discrete nodules.  Her most recent thyroid ultrasound on August 12, 2019 is also unremarkable.  Right lobe  measuring 5.1 cm with no discrete nodule, left lobe measuring 1.1 cm with no discrete nodules. She denies dysphagia, shortness of breath, nor voice change.  She denies palpitations, tremors, heat/cold intolerance.    Review of Systems Limited as above.  Objective:    There were no vitals taken for this visit.  Wt Readings from Last 3 Encounters:  07/30/18 254 lb (115.2 kg)  05/10/18 252 lb (114.3 kg)    Recent Results (from the past 2160 hour(s))  TSH     Status: None   Collection Time: 08/05/19 12:37 PM  Result Value Ref Range   TSH 2.39 mIU/L    Comment:           Reference Range .           > or = 20 Years  0.40-4.50 .                Pregnancy Ranges           First trimester    0.26-2.66           Second trimester   0.55-2.73           Third trimester    0.43-2.91   T4, free     Status: None   Collection Time: 08/05/19 12:37 PM  Result Value Ref Range   Free T4 1.1 0.8 - 1.8 ng/dL     February 25, 2018 labs: Free T4 1.3 2, TSH 2.3 4, free T3 3.1 A1c 5.1%       Assessment & Plan:   1. Goiter -I discussed her labs and thyroid ultrasound with her. -Her previsit thyroid function tests are still consistent with euthyroid state.  She will not need intervention with thyroid hormone nor antithyroid treatment at this time.   -She will need repeat thyroid function test in 1 year with office visit for exam.   -She has A1c of 5.1% indicating absence of prediabetes/diabetes.  - I advised her  to maintain close follow up with Patient, No Pcp Per for primary care needs.     - Time spent on this patient care encounter:  20 minutes of which 50% was spent in  counseling and the rest reviewing  her current and  previous labs / studies and medications  doses and developing a plan for  long term care. Linda Gutierrez  participated in the discussions, expressed understanding, and voiced agreement with the above plans.  All questions were answered to her satisfaction. she is  encouraged to contact clinic should she have any questions or concerns prior to her return visit.  Follow up plan: Return in about 1 year (around 08/16/2020) for Follow up with Pre-visit Labs.   [name], MD [facility] [address] [place], [place] [address] Phone: [phone]  Fax: [phone]     08/17/2019, 4:55 PM  This note was partially dictated with voice recognition software. Similar sounding words can be transcribed inadequately or may not  be corrected upon review.

## 2019-08-26 ENCOUNTER — Encounter: Payer: Self-pay | Admitting: Certified Nurse Midwife

## 2019-09-09 ENCOUNTER — Ambulatory Visit: Payer: 59 | Attending: Internal Medicine

## 2019-09-09 DIAGNOSIS — Z23 Encounter for immunization: Secondary | ICD-10-CM

## 2019-09-09 NOTE — Progress Notes (Signed)
   Covid-19 Vaccination Clinic  Name:  Linda Gutierrez    MRN: [id number] DOB: [birth year]  09/09/2019  [name] was observed post Covid-19 immunization for 15 minutes without incident. She was provided with Vaccine Information Sheet and instruction to access the V-Safe system.   [name] was instructed to call 911 with any severe reactions post vaccine: Marland Kitchen Difficulty breathing  . Swelling of face and throat  . A fast heartbeat  . A bad rash all over body  . Dizziness and weakness   Immunizations Administered    Name Date Dose VIS Date Route   Pfizer COVID-19 Vaccine 09/09/2019 10:33 AM 0.3 mL 05/20/2019 Intramuscular   Manufacturer: [employer], [employer]   Lot: [id number]   [place]: [id number]

## 2019-09-27 ENCOUNTER — Encounter: Payer: Self-pay | Admitting: *Deleted

## 2019-10-03 ENCOUNTER — Ambulatory Visit: Payer: 59 | Attending: Internal Medicine

## 2019-10-03 DIAGNOSIS — Z23 Encounter for immunization: Secondary | ICD-10-CM

## 2019-10-03 NOTE — Progress Notes (Signed)
   Covid-19 Vaccination Clinic  Name:  Rada Maaske    MRN: [id number] DOB: 30-Apr-1976  10/03/2019  Ms. Heitz was observed post Covid-19 immunization for 15 minutes without incident. She was provided with Vaccine Information Sheet and instruction to access the V-Safe system.   Ms. Barthell was instructed to call 911 with any severe reactions post vaccine: Marland Kitchen Difficulty breathing  . Swelling of face and throat  . A fast heartbeat  . A bad rash all over body  . Dizziness and weakness   Immunizations Administered    Name Date Dose VIS Date Route   Pfizer COVID-19 Vaccine 10/03/2019 12:39 PM 0.3 mL 08/03/2018 Intramuscular   Manufacturer: [place]   Lot: [id number]   [place]: [id number]

## 2019-10-10 ENCOUNTER — Telehealth: Payer: Self-pay | Admitting: "Endocrinology

## 2019-10-10 NOTE — Telephone Encounter (Signed)
Pt left a VM that she received a letter in the mail from [place] stating that she was scheduled for an ultrasound and she does not understand why because she just had one a couple months ago. Please advise.

## 2019-10-11 NOTE — Telephone Encounter (Signed)
Pt notified that this appt is not necessary and appt will be cancelled

## 2020-06-12 ENCOUNTER — Other Ambulatory Visit: Payer: Self-pay | Admitting: "Endocrinology

## 2020-06-12 ENCOUNTER — Telehealth: Payer: Self-pay | Admitting: "Endocrinology

## 2020-06-12 DIAGNOSIS — E049 Nontoxic goiter, unspecified: Secondary | ICD-10-CM

## 2020-06-12 NOTE — Telephone Encounter (Signed)
Pt made aware

## 2020-06-12 NOTE — Telephone Encounter (Signed)
Pt would like this done in [employer]

## 2020-06-12 NOTE — Telephone Encounter (Signed)
I ordered it

## 2020-06-12 NOTE — Telephone Encounter (Signed)
Pt has an appt in March but states she wants a thyroid ultrasound before her appt and also wants her appt moved up. I can move pt appt up, but need to know if [place] can be ordered and scheduled for the pt. Pt requesting a call back

## 2020-06-19 ENCOUNTER — Ambulatory Visit (HOSPITAL_COMMUNITY)
Admission: RE | Admit: 2020-06-19 | Discharge: 2020-06-19 | Disposition: A | Discharge status: Home / Self Care | Payer: No Typology Code available for payment source | Source: Ambulatory Visit | Attending: "Endocrinology | Admitting: "Endocrinology

## 2020-06-19 ENCOUNTER — Other Ambulatory Visit: Payer: Self-pay

## 2020-06-19 DIAGNOSIS — E049 Nontoxic goiter, unspecified: Secondary | ICD-10-CM | POA: Diagnosis present

## 2020-07-28 LAB — T3, FREE: T3, Free: 2.8 pg/mL (ref 2.3–4.2)

## 2020-07-28 LAB — TSH: TSH: 2.64 mIU/L

## 2020-07-28 LAB — T4, FREE: Free T4: 1.1 ng/dL (ref 0.8–1.8)

## 2020-08-16 ENCOUNTER — Ambulatory Visit: Payer: 59 | Admitting: "Endocrinology

## 2021-01-08 ENCOUNTER — Ambulatory Visit (INDEPENDENT_AMBULATORY_CARE_PROVIDER_SITE_OTHER): Payer: No Typology Code available for payment source | Admitting: Nurse Practitioner

## 2021-01-08 ENCOUNTER — Other Ambulatory Visit: Payer: Self-pay

## 2021-01-08 ENCOUNTER — Encounter: Payer: Self-pay | Admitting: Nurse Practitioner

## 2021-01-08 ENCOUNTER — Telehealth: Payer: Self-pay | Admitting: *Deleted

## 2021-01-08 VITALS — BP 134/92 | HR 74 | Resp 20 | Ht 67.52 in | Wt 246.8 lb

## 2021-01-08 DIAGNOSIS — N920 Excessive and frequent menstruation with regular cycle: Secondary | ICD-10-CM

## 2021-01-08 DIAGNOSIS — N393 Stress incontinence (female) (male): Secondary | ICD-10-CM

## 2021-01-08 NOTE — Telephone Encounter (Signed)
Referral placed for [facility] urogynecology they will call to schedule.

## 2021-01-08 NOTE — Progress Notes (Signed)
   Acute Office Visit  Subjective:    Patient ID: Linda Gutierrez, female    DOB: [birth year], 45 y.o.   MRN: [id number]   HPI 45 y.o. presents today for spotting that occurred 01/04/2021. She returned home from an oncology visit with her mom who was recently diagnosed with uterine cancer. Bleeding lasted about 1 hour and was light but had tissue/clot-like pieces. She does report vaginal dryness the few days prior to episode. No intercourse prior. S/P 2017 [facility] for abnormal pap smear after LEEP did not resolve abnormality. 2015 right breast cancer. Since her hysterectomy she has had worsening stress incontinence requiring frequent changing of incontinence pads. Denies changes in weight, diet, or activity.   Review of Systems  Constitutional: Negative.   Gastrointestinal:  Negative for abdominal pain, anal bleeding, blood in stool, constipation and rectal pain.  Genitourinary:  Positive for vaginal bleeding. Negative for dysuria, frequency, hematuria, urgency, vaginal discharge and vaginal pain.      Objective:    Physical Exam Constitutional:      Appearance: Normal appearance.  Genitourinary:    General: Normal vulva.     Vagina: Normal.     Uterus: Absent.      Rectum: Normal.    BP (!) 134/92 (BP Location: Left Arm, Patient Position: Sitting)   Pulse 74   Resp 20   Ht 5' 7.52" (1.715 m)   Wt 246 lb 12.8 oz (111.9 kg)   BMI 38.06 kg/m  Wt Readings from Last 3 Encounters:  01/08/21 246 lb 12.8 oz (111.9 kg)  07/30/18 254 lb (115.2 kg)  05/10/18 252 lb (114.3 kg)        Assessment & Plan:   Problem List Items Addressed This Visit   None Visit Diagnoses     Spotting    -  Primary   Stress incontinence          Plan: No evidence for source of bleeding. Reassured that bleeding is not from uterus since she has had hysterectomy. Vaginal dryness could be causing symptoms, so we discussed options of OTC lubricants versus oil-based lubricant such as coconut oil. Will avoid  estrogen-containing products due to history of breast cancer. She would like to try coconut oil. Incontinence is affecting her daily life, so we discussed lifestyle modifications to include weight loss, voiding more often to avoid overfilling of bladder, avoiding triggering foods/drink, pelvic floor PT, and Urogynecology referral. She would like to see Urogynecology. We will send referral. She will let us know if she experiences anymore episodes of bleeding. She is agreeable to plan.      Tamela Gammon DNP, 10:02 AM 01/08/2021

## 2021-01-08 NOTE — Telephone Encounter (Signed)
-----   Message from Tamela Gammon, NP sent at 01/08/2021 10:09 AM EDT ----- Please send referral to urogynecology for stress incontinence. Thank you.

## 2021-01-14 NOTE — Telephone Encounter (Signed)
Urogyn called and left message x 1.

## 2021-01-24 NOTE — Telephone Encounter (Signed)
Patient scheduled on 02/06/21 Linda Folds, MD

## 2021-01-27 NOTE — Progress Notes (Signed)
[facility] CONSULT NOTE  Patient Care Team: Patient, No Pcp Per (Inactive) as PCP - General (General Practice)  CHIEF COMPLAINTS/PURPOSE OF CONSULTATION:  Diagnosed history of breast cancer  HISTORY OF PRESENTING ILLNESS:  Linda Gutierrez 45 y.o. female is here because of a history of CHEK2-related breast cancer having been treated with neoadjuvant chemotherapy with AC x4, bilateral mastectomies with reconstruction, adjuvant therapy with 12 weeks of weekly Taxol, and antiestrogen therapy with tamoxifen completed in June 2020. She presents to the clinic today for evaluation and discussion of treatment options.  Her treatment was entirely completed in [place].  She had moved to the area to be closer to her mother in [place].  She started going to [facility] for her surveillance follow-ups.  She moved to [facility] and therefore wanted to come to our office to be seen for surveillance.  Her mom was recently diagnosed with endometrial cancer and had genetic testing done and did not have the reports yet.  There is no other family history of any other cancers.  I reviewed her records extensively and collaborated the history with the patient.  SUMMARY OF ONCOLOGIC HISTORY: Oncology History  CHEK2-related breast cancer ([place])  11/25/2013 Initial Diagnosis   CHEK2-related breast cancer [facility])  May 2015 she noted a lump in the right breast at 2 o'clock. MRI Breast (11/21/13): large geographic areas of the right breast malignancy within the 6 to 8 o'clock position right breast lower outer quadrant extending from the chest wall to the retroareolar area of the right breast. Multiple suspicious nodules within the right axilla. 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    04/18/2014 Surgery   Bilateral mastectomies  Right: DCIS but no residual invasive carcinoma. Margins were uninvolved.  Sentinel lymph node had a focus of metastatic carcinoma measuring 0.4 cm surgical stage was Tis N1A. Left: benign     Chemotherapy   Received neoadjuvant chemotherapy with Adriamycin and Cytoxan x4.  She then had surgery and then received adjuvant therapy with Taxol weekly x12        - 11/2018 Anti-estrogen oral therapy   Adjuvant antiestrogen therapy with tamoxifen completed June 2020     MEDICAL HISTORY:  Past Medical History:  Diagnosis Date   Abnormal Pap smear of cervix 2016   HPV   Breast cancer ([place]) 2015   rt side   Depression    STD (sexually transmitted disease)    HPV   Urinary incontinence     SURGICAL HISTORY: Past Surgical History:  Procedure Laterality Date   BREAST RECONSTRUCTION     BREAST RECONSTRUCTION     with implants   CERVICAL BIOPSY  W/ LOOP ELECTRODE EXCISION     COLPOSCOPY     MASTECTOMY  2017   TOTAL VAGINAL HYSTERECTOMY      SOCIAL HISTORY: Social History   Socioeconomic History   Marital status: Married    Spouse name: Not on file   Number of children: Not on file   Years of education: Not on file   Highest education level: Not on file  Occupational History   Not on file  Tobacco Use   Smoking status: Former    Packs/day: 0.50    Years: 10.00    Pack years: 5.00    Types: Cigarettes    Quit date: 2015    Years since quitting: 7.6   Smokeless tobacco: Never  Vaping Use  Vaping Use: Never used  Substance and Sexual Activity   Alcohol use: Yes    Alcohol/week: 6.0 standard drinks    Types: 6 Standard drinks or equivalent per week   Drug use: Never   Sexual activity: Yes    Partners: Male    Birth control/protection: Surgical    Comment: hysterectomy 2017  Other Topics Concern   Not on file  Social History Narrative   Not on file   Social Determinants of Health   Financial Resource Strain: Not on file  Food Insecurity: Not on file  Transportation Needs: Not on file  Physical Activity: Not on file  Stress: Not on  file  Social Connections: Not on file  Intimate Partner Violence: Not on file    FAMILY HISTORY: Family History  Problem Relation Age of Onset   Thyroid disease Mother    Uterine cancer Mother    Diabetes Father    Hypertension Father    Lupus Father    Diabetes Maternal Grandmother    Heart disease Maternal Grandmother     ALLERGIES:  has No Known Allergies.  MEDICATIONS:  Current Outpatient Medications  Medication Sig Dispense Refill   sertraline (ZOLOFT) 50 MG tablet Take by mouth daily.     No current facility-administered medications for this visit.    REVIEW OF SYSTEMS:   Constitutional: Denies fevers, chills or abnormal night sweats Eyes: Denies blurriness of vision, double vision or watery eyes Ears, nose, mouth, throat, and face: Denies mucositis or sore throat Respiratory: Denies cough, dyspnea or wheezes Cardiovascular: Denies palpitation, chest discomfort or lower extremity swelling Gastrointestinal:  Denies nausea, heartburn or change in bowel habits Skin: Denies abnormal skin rashes Lymphatics: Denies new lymphadenopathy or easy bruising Neurological:Denies numbness, tingling or new weaknesses Behavioral/Psych: Mood is stable, no new changes  Breast: Bilateral mastectomies with reconstruction.  No palpable lumps or nodules All other systems were reviewed with the patient and are negative.  PHYSICAL EXAMINATION: ECOG PERFORMANCE STATUS: 0 - Asymptomatic  Vitals:   01/28/21 1257  BP: 124/79  Pulse: 77  Resp: 18  Temp: (!) 97.3 F (36.3 C)  SpO2: 98%   Filed Weights   01/28/21 1257  Weight: 249 lb 9.6 oz (113.2 kg)    BREAST: Bilateral mastectomies with reconstruction (exam performed in the presence of a chaperone)      RADIOGRAPHIC STUDIES: I have personally reviewed the radiological reports and agreed with the findings in the report.  ASSESSMENT AND PLAN:  CHEK2-related breast cancer ([place]) CHEK2-related breast cancer: Right breast lump  detected May 2015 biopsy: Poorly differentiated IDC ER/PR positive HER2 negative with a Ki-67 of 12%, status post neoadjuvant chemotherapy with dose dense Adriamycin and Cytoxan x4 followed by bilateral mastectomies: No residual invasive cancer in the breast but lymph node was positive.  Followed by adjuvant Taxol weekly x12 followed by tamoxifen completed June 2020  Breast cancer surveillance: 1.  Breast exam 01/28/2021: Benign 2. no role of mammograms since she had bilateral mastectomies.  CHEK 2 Mutation: associated with risk of not only breast cancer but also colon, thyroid and kidney cancers.   Surveillance: I discussed with the patient that there is no role of mammograms or lab tests.  Other cancer surveillance: Apart of breast cancer, there are no definite surveillance approaches to kidney cancer. For colon cancer she will need a colonoscopy at age 18 since there is no family history and for thyroid cancer she will need manual thyroid examinations for any lumps or nodules. I  will refer her to gastroenterology with Dr. Silverio Decamp  Return to clinic in 1 year for follow-up with long-term survivorship clinic.  All questions were answered. The patient knows to call the clinic with any problems, questions or concerns.   Rulon Eisenmenger, MD, MPH 01/28/2021    I, Thana Ates, am acting as scribe for Nicholas Lose, MD.  I have reviewed the above documentation for accuracy and completeness, and I agree with the above.

## 2021-01-28 ENCOUNTER — Inpatient Hospital Stay
Payer: No Typology Code available for payment source | Attending: Hematology and Oncology | Admitting: Hematology and Oncology

## 2021-01-28 ENCOUNTER — Other Ambulatory Visit: Payer: Self-pay

## 2021-01-28 DIAGNOSIS — Z1589 Genetic susceptibility to other disease: Secondary | ICD-10-CM

## 2021-01-28 DIAGNOSIS — Z87891 Personal history of nicotine dependence: Secondary | ICD-10-CM | POA: Diagnosis not present

## 2021-01-28 DIAGNOSIS — Z1502 Genetic susceptibility to malignant neoplasm of ovary: Secondary | ICD-10-CM | POA: Diagnosis not present

## 2021-01-28 DIAGNOSIS — Z853 Personal history of malignant neoplasm of breast: Secondary | ICD-10-CM | POA: Insufficient documentation

## 2021-01-28 DIAGNOSIS — Z1501 Genetic susceptibility to malignant neoplasm of breast: Secondary | ICD-10-CM | POA: Diagnosis not present

## 2021-01-28 DIAGNOSIS — C50919 Malignant neoplasm of unspecified site of unspecified female breast: Secondary | ICD-10-CM

## 2021-01-28 DIAGNOSIS — Z1509 Genetic susceptibility to other malignant neoplasm: Secondary | ICD-10-CM | POA: Insufficient documentation

## 2021-01-28 DIAGNOSIS — Z9221 Personal history of antineoplastic chemotherapy: Secondary | ICD-10-CM

## 2021-01-28 NOTE — Progress Notes (Signed)
RN obtained signature from patient for medical records request from [facility].   RN spoke with Tami at [phone] ext [phone].  Request successfully faxed to [phone].

## 2021-01-28 NOTE — Assessment & Plan Note (Addendum)
CHEK2-related breast cancer: Right breast lump detected May 2015 biopsy: Poorly differentiated IDC ER/PR positive HER2 negative with a Ki-67 of 12%, status post neoadjuvant chemotherapy with dose dense Adriamycin and Cytoxan x4 followed by bilateral mastectomies: No residual invasive cancer in the breast but lymph node was positive.  Followed by adjuvant Taxol weekly x12 followed by tamoxifen completed June 2020  Breast cancer surveillance: 1.  Breast exam 01/28/2021: Benign 2. no role of mammograms since she had bilateral mastectomies.  CHEK 2 Mutation: associated with risk of not only breast cancer but also colon, thyroid and kidney cancers.   Surveillance:   Other cancer surveillance: Apart of breast cancer, there are no definite surveillance approaches to kidney cancer. For colon cancer she will need a colonoscopy at age 39 since there is no family history and for thyroid cancer she will need manual thyroid examinations for any lumps or nodules.

## 2021-01-31 ENCOUNTER — Telehealth: Payer: Self-pay | Admitting: Hematology and Oncology

## 2021-01-31 NOTE — Telephone Encounter (Signed)
Scheduled per 8/22 los. Called and spoke with pt confirmed 8/22 appt

## 2021-02-06 ENCOUNTER — Ambulatory Visit: Payer: No Typology Code available for payment source | Admitting: Obstetrics and Gynecology

## 2021-02-20 ENCOUNTER — Encounter: Payer: Self-pay | Admitting: Hematology and Oncology

## 2021-02-20 ENCOUNTER — Other Ambulatory Visit: Payer: Self-pay | Admitting: *Deleted

## 2021-02-20 DIAGNOSIS — Z1502 Genetic susceptibility to malignant neoplasm of ovary: Secondary | ICD-10-CM

## 2021-02-20 DIAGNOSIS — C50919 Malignant neoplasm of unspecified site of unspecified female breast: Secondary | ICD-10-CM

## 2021-02-20 NOTE — Progress Notes (Signed)
Per MD request RN placed referral to Dr. Silverio Decamp with GI to f/u with pt and schedule colonoscopy.  RN placed message to HIM team to fax records.

## 2021-06-11 ENCOUNTER — Encounter: Payer: Self-pay | Admitting: Hematology and Oncology

## 2021-12-05 ENCOUNTER — Telehealth: Payer: Self-pay | Admitting: *Deleted

## 2021-12-05 NOTE — Telephone Encounter (Signed)
Received call from pt with hx of bilateral mastectomy with complaint of new right sided chest wall nodule x several days.  Pt states she has started working out more and is unsure if the nodule is related to a pulled muscle or recurrence.  Pt requesting in office visit for breast exam and further evaluation.  Appt scheduled, pt verbalized understanding of appt date and time.

## 2021-12-06 ENCOUNTER — Other Ambulatory Visit: Payer: Self-pay

## 2021-12-06 ENCOUNTER — Inpatient Hospital Stay: Payer: 59 | Attending: Adult Health | Admitting: Adult Health

## 2021-12-06 VITALS — BP 133/93 | HR 74 | Temp 97.7°F | Resp 18 | Ht 67.52 in | Wt 244.2 lb

## 2021-12-06 DIAGNOSIS — N6310 Unspecified lump in the right breast, unspecified quadrant: Secondary | ICD-10-CM | POA: Diagnosis not present

## 2021-12-06 DIAGNOSIS — Z853 Personal history of malignant neoplasm of breast: Secondary | ICD-10-CM | POA: Diagnosis not present

## 2021-12-06 DIAGNOSIS — Z87891 Personal history of nicotine dependence: Secondary | ICD-10-CM | POA: Diagnosis not present

## 2021-12-06 DIAGNOSIS — Z1509 Genetic susceptibility to other malignant neoplasm: Secondary | ICD-10-CM | POA: Diagnosis not present

## 2021-12-06 DIAGNOSIS — Z9221 Personal history of antineoplastic chemotherapy: Secondary | ICD-10-CM | POA: Insufficient documentation

## 2021-12-06 DIAGNOSIS — Z9013 Acquired absence of bilateral breasts and nipples: Secondary | ICD-10-CM | POA: Insufficient documentation

## 2021-12-06 DIAGNOSIS — N644 Mastodynia: Secondary | ICD-10-CM | POA: Diagnosis not present

## 2021-12-06 DIAGNOSIS — Z1589 Genetic susceptibility to other disease: Secondary | ICD-10-CM

## 2021-12-06 DIAGNOSIS — C50919 Malignant neoplasm of unspecified site of unspecified female breast: Secondary | ICD-10-CM

## 2021-12-06 DIAGNOSIS — Z1502 Genetic susceptibility to malignant neoplasm of ovary: Secondary | ICD-10-CM

## 2021-12-06 NOTE — Progress Notes (Signed)
[facility] Cancer Follow up:    Patient, No Pcp Per No address on file   DIAGNOSIS:   SUMMARY OF ONCOLOGIC HISTORY: Oncology History  CHEK2-related breast cancer ([place])  11/25/2013 Initial Diagnosis   CHEK2-related breast cancer [facility])  May 2015 she noted a lump in the right breast at 2 o'clock. MRI Breast (11/21/13): large geographic areas of the right breast malignancy within the 6 to 8 o'clock position right breast lower outer quadrant extending from the chest wall to the retroareolar area of the right breast. Multiple suspicious nodules within the right axilla. 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    04/18/2014 Surgery   Bilateral mastectomies  Right: DCIS but no residual invasive carcinoma. Margins were uninvolved. Sentinel lymph node had a focus of metastatic carcinoma measuring 0.4 cm surgical stage was Tis N1A. Left: benign     Chemotherapy   Received neoadjuvant chemotherapy with Adriamycin and Cytoxan x4.  She then had surgery and then received adjuvant therapy with Taxol weekly x12        - 11/2018 Anti-estrogen oral therapy   Adjuvant antiestrogen therapy with tamoxifen completed June 2020     CURRENT THERAPY: observation  INTERVAL HISTORY: Linda Gutierrez 46 y.o. female returns for urgent evaluation of a sharp nerve pain that she began to experience in her right breast that started 2 days ago.  She also developed a small pea-sized knot in the same spot where the breast cancer was.  She denies any erythema swelling or tenderness in that location.  She has undergone right breast mastectomy and has not had imaging since completing that surgery.   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  MEDICAL HISTORY: Past Medical History:  Diagnosis Date   Abnormal Pap smear of cervix 2016   HPV   Breast cancer ([place]) 2015   rt side   Depression    STD (sexually transmitted disease)    HPV   Urinary incontinence     SURGICAL HISTORY: Past Surgical History:  Procedure Laterality Date   BREAST RECONSTRUCTION     BREAST RECONSTRUCTION     with implants   CERVICAL BIOPSY  W/ LOOP ELECTRODE EXCISION     COLPOSCOPY     MASTECTOMY  2017   TOTAL VAGINAL HYSTERECTOMY      SOCIAL HISTORY: Social History   Socioeconomic History   Marital status: Married    Spouse name: Not on file   Number of children: Not on file   Years of education: Not on file   Highest education level: Not on file  Occupational History   Not on file  Tobacco Use   Smoking status: Former    Packs/day: 0.50    Years: 10.00    Total pack years: 5.00    Types: Cigarettes    Quit date: 2015    Years since quitting: 8.5   Smokeless tobacco: Never  Vaping Use   Vaping Use: Never used  Substance and Sexual Activity   Alcohol use: Yes    Alcohol/week: 6.0 standard drinks of alcohol  Types: 6 Standard drinks or equivalent per week   Drug use: Never   Sexual activity: Yes    Partners: Male    Birth control/protection: Surgical    Comment: hysterectomy 2017  Other Topics Concern   Not on file  Social History Narrative   Not on file   Social Determinants of Health   Financial Resource Strain: Not on file  Food Insecurity: Not on file  Transportation Needs: Not on file  Physical Activity: Not on file  Stress: Not on file  Social Connections: Not on file  Intimate Partner Violence: Not on file    FAMILY HISTORY: Family History  Problem Relation Age of Onset   Thyroid disease Mother     Uterine cancer Mother    Diabetes Father    Hypertension Father    Lupus Father    Diabetes Maternal Grandmother    Heart disease Maternal Grandmother     Review of Systems  Constitutional:  Negative for appetite change, chills, fatigue, fever and unexpected weight change.  HENT:   Negative for hearing loss, lump/mass and trouble swallowing.   Eyes:  Negative for eye problems and icterus.  Respiratory:  Negative for chest tightness, cough and shortness of breath.   Cardiovascular:  Negative for chest pain, leg swelling and palpitations.  Gastrointestinal:  Negative for abdominal distention, abdominal pain, constipation, diarrhea, nausea and vomiting.  Endocrine: Negative for hot flashes.  Genitourinary:  Negative for difficulty urinating.   Musculoskeletal:  Negative for arthralgias.  Skin:  Negative for itching and rash.  Neurological:  Negative for dizziness, extremity weakness, headaches and numbness.  Hematological:  Negative for adenopathy. Does not bruise/bleed easily.  Psychiatric/Behavioral:  Negative for depression. The patient is not nervous/anxious.       PHYSICAL EXAMINATION  ECOG PERFORMANCE STATUS: 1 - Symptomatic but completely ambulatory  Vitals:   12/06/21 1501  BP: (!) 133/93  Pulse: 74  Resp: 18  Temp: 97.7 F (36.5 C)  SpO2: 97%    Physical Exam Constitutional:      General: She is not in acute distress.    Appearance: Normal appearance. She is not toxic-appearing.  HENT:     Head: Normocephalic and atraumatic.  Eyes:     General: No scleral icterus. Cardiovascular:     Rate and Rhythm: Normal rate and regular rhythm.     Pulses: Normal pulses.     Heart sounds: Normal heart sounds.  Pulmonary:     Effort: Pulmonary effort is normal.     Breath sounds: Normal breath sounds.  Chest:     Comments: Right breast status post mastectomy and reconstruction with small nodule noted at 3:00 5 cm from the nipple. Abdominal:     General: Abdomen is  flat. Bowel sounds are normal. There is no distension.     Palpations: Abdomen is soft.     Tenderness: There is no abdominal tenderness.  Musculoskeletal:        General: No swelling.     Cervical back: Neck supple.  Lymphadenopathy:     Cervical: No cervical adenopathy.  Skin:    General: Skin is warm and dry.     Findings: No rash.  Neurological:     General: No focal deficit present.     Mental Status: She is alert.  Psychiatric:        Mood and Affect: Mood normal.        Behavior: Behavior normal.     LABORATORY DATA: None for this visit  ASSESSMENT and THERAPY PLAN:   CHEK2-related breast cancer ([place]) Linda Gutierrez is here today for urgent evaluation of a new right breast nodule and pain.  I have placed orders for a mammogram and ultrasound to fully evaluate the area in question to be completed at [facility].  I asked my nurse to help get this arranged.  All questions were answered. The patient knows to call the clinic with any problems, questions or concerns. We can certainly see the patient much sooner if necessary.  Total encounter time:20 minutes*in face-to-face visit time, chart review, lab review, care coordination, order entry, and documentation of the encounter time.  Wilber Bihari, NP 12/13/21 2:10 PM Medical Oncology and Hematology [facility] [place], [place] [address] Tel. [phone]    Fax. [phone]  *Total Encounter Time as defined by the Centers for Medicare and Medicaid Services includes, in addition to the face-to-face time of a patient visit (documented in the note above) non-face-to-face time: obtaining and reviewing outside history, ordering and reviewing medications, tests or procedures, care coordination (communications with other health care professionals or caregivers) and documentation in the medical record.

## 2021-12-13 ENCOUNTER — Encounter: Payer: Self-pay | Admitting: Adult Health

## 2021-12-13 NOTE — Assessment & Plan Note (Signed)
[name] is here today for urgent evaluation of a new right breast nodule and pain.  I have placed orders for a mammogram and ultrasound to fully evaluate the area in question to be completed at [facility].  I asked my nurse to help get this arranged.

## 2021-12-16 ENCOUNTER — Other Ambulatory Visit: Payer: Self-pay | Admitting: General Surgery

## 2021-12-16 ENCOUNTER — Other Ambulatory Visit (HOSPITAL_COMMUNITY): Payer: Self-pay | Admitting: General Surgery

## 2021-12-16 DIAGNOSIS — C50411 Malignant neoplasm of upper-outer quadrant of right female breast: Secondary | ICD-10-CM

## 2021-12-18 ENCOUNTER — Ambulatory Visit (HOSPITAL_COMMUNITY)
Admission: RE | Admit: 2021-12-18 | Discharge: 2021-12-18 | Disposition: A | Discharge status: Home / Self Care | Payer: 59 | Source: Ambulatory Visit | Attending: General Surgery | Admitting: General Surgery

## 2021-12-18 DIAGNOSIS — C50411 Malignant neoplasm of upper-outer quadrant of right female breast: Secondary | ICD-10-CM | POA: Diagnosis present

## 2021-12-18 MED ORDER — GADOBUTROL 1 MMOL/ML IV SOLN
10.0000 mL | Freq: Once | INTRAVENOUS | Status: AC | PRN
  Administered 2021-12-18: 10 mL via INTRAVENOUS

## 2021-12-19 ENCOUNTER — Encounter: Payer: Self-pay | Admitting: Adult Health

## 2021-12-20 ENCOUNTER — Inpatient Hospital Stay (HOSPITAL_BASED_OUTPATIENT_CLINIC_OR_DEPARTMENT_OTHER): Payer: 59 | Admitting: Adult Health

## 2021-12-20 ENCOUNTER — Telehealth: Payer: Self-pay

## 2021-12-20 ENCOUNTER — Inpatient Hospital Stay: Payer: 59

## 2021-12-20 ENCOUNTER — Encounter: Payer: Self-pay | Admitting: Adult Health

## 2021-12-20 VITALS — BP 118/94 | HR 83 | Temp 97.4°F | Resp 18 | Ht 67.52 in | Wt 239.3 lb

## 2021-12-20 DIAGNOSIS — C773 Secondary and unspecified malignant neoplasm of axilla and upper limb lymph nodes: Secondary | ICD-10-CM | POA: Insufficient documentation

## 2021-12-20 DIAGNOSIS — Z5111 Encounter for antineoplastic chemotherapy: Secondary | ICD-10-CM | POA: Insufficient documentation

## 2021-12-20 DIAGNOSIS — C7951 Secondary malignant neoplasm of bone: Secondary | ICD-10-CM | POA: Insufficient documentation

## 2021-12-20 DIAGNOSIS — Z170 Estrogen receptor positive status [ER+]: Secondary | ICD-10-CM | POA: Insufficient documentation

## 2021-12-20 DIAGNOSIS — C50811 Malignant neoplasm of overlapping sites of right female breast: Secondary | ICD-10-CM | POA: Insufficient documentation

## 2021-12-20 DIAGNOSIS — C787 Secondary malignant neoplasm of liver and intrahepatic bile duct: Secondary | ICD-10-CM | POA: Insufficient documentation

## 2021-12-20 DIAGNOSIS — Z510 Encounter for antineoplastic radiation therapy: Secondary | ICD-10-CM | POA: Diagnosis present

## 2021-12-20 DIAGNOSIS — Z79818 Long term (current) use of other agents affecting estrogen receptors and estrogen levels: Secondary | ICD-10-CM | POA: Diagnosis not present

## 2021-12-20 DIAGNOSIS — Z79811 Long term (current) use of aromatase inhibitors: Secondary | ICD-10-CM | POA: Insufficient documentation

## 2021-12-20 DIAGNOSIS — C778 Secondary and unspecified malignant neoplasm of lymph nodes of multiple regions: Secondary | ICD-10-CM | POA: Diagnosis not present

## 2021-12-20 DIAGNOSIS — C50911 Malignant neoplasm of unspecified site of right female breast: Secondary | ICD-10-CM

## 2021-12-20 LAB — CMP (CANCER CENTER ONLY)
ALT: 48 U/L — ABNORMAL HIGH (ref 0–44)
AST: 41 U/L (ref 15–41)
Albumin: 4.1 g/dL (ref 3.5–5.0)
Alkaline Phosphatase: 103 U/L (ref 38–126)
Anion gap: 6 (ref 5–15)
BUN: 12 mg/dL (ref 6–20)
CO2: 26 mmol/L (ref 22–32)
Calcium: 9.4 mg/dL (ref 8.9–10.3)
Chloride: 106 mmol/L (ref 98–111)
Creatinine: 0.71 mg/dL (ref 0.44–1.00)
GFR, Estimated: >60 mL/min (ref >60)
Glucose, Bld: 114 mg/dL — ABNORMAL HIGH (ref 70–99)
Potassium: 4.2 mmol/L (ref 3.5–5.1)
Sodium: 138 mmol/L (ref 135–145)
Total Bilirubin: 0.7 mg/dL (ref 0.3–1.2)
Total Protein: 7.8 g/dL (ref 6.5–8.1)

## 2021-12-20 LAB — CBC WITH DIFFERENTIAL (CANCER CENTER ONLY)
Abs Immature Granulocytes: 0.03 10*3/uL (ref 0.00–0.07)
Basophils Absolute: 0.1 10*3/uL (ref 0.0–0.1)
Basophils Relative: 1 %
Eosinophils Absolute: 0.2 10*3/uL (ref 0.0–0.5)
Eosinophils Relative: 2 %
HCT: 42.6 % (ref 36.0–46.0)
Hemoglobin: 15.2 g/dL — ABNORMAL HIGH (ref 12.0–15.0)
Immature Granulocytes: 0 %
Lymphocytes Relative: 26 %
Lymphs Abs: 2.4 10*3/uL (ref 0.7–4.0)
MCH: 32.7 pg (ref 26.0–34.0)
MCHC: 35.7 g/dL (ref 30.0–36.0)
MCV: 91.6 fL (ref 80.0–100.0)
Monocytes Absolute: 0.7 10*3/uL (ref 0.1–1.0)
Monocytes Relative: 7 %
Neutro Abs: 5.6 10*3/uL (ref 1.7–7.7)
Neutrophils Relative %: 64 %
Platelet Count: 248 10*3/uL (ref 150–400)
RBC: 4.65 MIL/uL (ref 3.87–5.11)
RDW: 12.1 % (ref 11.5–15.5)
WBC Count: 8.9 10*3/uL (ref 4.0–10.5)
nRBC: 0 % (ref 0.0–0.2)

## 2021-12-20 MED ORDER — TAMOXIFEN CITRATE 20 MG PO TABS: 20.0000 mg | ORAL_TABLET | Freq: Every day | ORAL | 0 refills | Status: DC

## 2021-12-20 NOTE — Assessment & Plan Note (Signed)
Linda Gutierrez is a 46 year old woman with history of right-sided estrogen positive breast cancer diagnosed in 2015 along with CHEK2 mutation status post bilateral mastectomies, adjuvant chemotherapy, and adjuvant antiestrogen therapy with tamoxifen x5 years completing in June 2020.  Her recent imaging and pathology results are consistent with recurrent breast cancer that is estrogen progesterone positive.  Her MRI is concerning for possible hepatic metastases therefore we need to do a PET scan to evaluate the extent of involvement of the disease to it can guide her therapy.  She is tentatively scheduled for evaluation by Dr. Isidore Moos on July 21 since she has right axillary lymph node involvement along with lumpectomy and axillary node dissection which is scheduled with Dr. Barry Dienes on July 25.  Linda Gutierrez met with Dr. Lindi Adie who recommended PET scan.  She will start Tamoxifen daily.  We are going to check labs with [facility], estradiol, and LH.    We will call her with pet scan appt.  She will return in one week (tentatively for f/u on pet scan results).

## 2021-12-20 NOTE — Telephone Encounter (Signed)
Spoke with Linda Gutierrez regarding her referral to GYN oncology. She has an appointment scheduled with Dr. Berline Lopes on 01/30/22 at 11:15. Patient agrees to date and time. She has been provided with office address and location. She is also aware of our mask and visitor policy. Patient verbalized understanding and will call with any questions.

## 2021-12-20 NOTE — Progress Notes (Addendum)
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 MRI Breast (11/21/13): large geographic areas of the right breast malignancy within the 6 to 8 o'clock position right breast lower outer quadrant extending from the chest wall to the retroareolar area of the right breast. Multiple suspicious nodules within the right axilla. 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    04/18/2014 Surgery   Bilateral mastectomies  Right: DCIS but no residual invasive carcinoma. Margins were uninvolved. Sentinel lymph node had a focus of metastatic carcinoma measuring 0.4 cm surgical stage was Tis N1A. Left: benign     Chemotherapy   Received neoadjuvant chemotherapy with Adriamycin and Cytoxan x4.  She then had surgery and then received adjuvant therapy with Taxol weekly x12        - 11/2018 Anti-estrogen oral therapy   Adjuvant antiestrogen therapy with tamoxifen completed June 2020     CURRENT THERAPY: to start treatment  INTERVAL HISTORY: Linda Gutierrez 46 y.o. female returns for follow up after I saw her approximately 2 weeks ago for new lump at her right breast cancer site.  Please note that she previously was diagnosed with ER/PR positive breast cancer in 2015 treated with bilateral mastectomies, adjuvant chemotherapy, and tamoxifen x5 years completing therapy in June 2020.  She underwent mammogram and ultrasound of the right breast on subsequent biopsy on December 13, 2021.  Pathology results were not available in the computer however we did call over and get a verbal report which noted  invasive ductal carcinoma ER/PR positive HER2 negative.  The right axilla was positive for recurrence.    She also underwent breast MRI on December 18, 2021 which demonstrated 3 irregular enhancing masses within the outer aspect of the reconstructed right breast multiple abnormal right axillary and subpectoral lymphadenopathy compatible with nodal metastasis.  And questionable suggestion of multiple hepatic metastases.  Linda Gutierrez is feeling moderately well and is anxious.  She is here for discussion of treatment options with her mom and daughter.   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  MEDICAL HISTORY: Past Medical History:  Diagnosis Date   Abnormal Pap smear of cervix 2016   HPV   Breast cancer ([place]) 2015   rt side   Depression    STD (sexually transmitted disease)    HPV   Urinary incontinence     SURGICAL HISTORY: Past Surgical History:  Procedure Laterality Date   BREAST RECONSTRUCTION     BREAST RECONSTRUCTION     with implants   CERVICAL BIOPSY  W/ LOOP ELECTRODE EXCISION     COLPOSCOPY     MASTECTOMY  2017   TOTAL VAGINAL HYSTERECTOMY      SOCIAL HISTORY: Social History   Socioeconomic History  Marital status: Married    Spouse name: Not on file   Number of children: Not on file   Years of education: Not on file   Highest education level: Not on file  Occupational History   Not on file  Tobacco Use   Smoking status: Former    Packs/day: 0.50    Years: 10.00    Total pack years: 5.00    Types: Cigarettes    Quit date: 2015    Years since quitting: 8.5    Smokeless tobacco: Never  Vaping Use   Vaping Use: Never used  Substance and Sexual Activity   Alcohol use: Yes    Alcohol/week: 6.0 standard drinks of alcohol    Types: 6 Standard drinks or equivalent per week   Drug use: Never   Sexual activity: Yes    Partners: Male    Birth control/protection: Surgical    Comment: hysterectomy 2017  Other Topics Concern   Not on file  Social History Narrative   Not on file   Social Determinants of Health   Financial Resource Strain: Not on file  Food Insecurity: Not on file  Transportation Needs: Not on file  Physical Activity: Not on file  Stress: Not on file  Social Connections: Not on file  Intimate Partner Violence: Not on file    FAMILY HISTORY: Family History  Problem Relation Age of Onset   Thyroid disease Mother    Uterine cancer Mother    Diabetes Father    Hypertension Father    Lupus Father    Diabetes Maternal Grandmother    Heart disease Maternal Grandmother     Review of Systems  Constitutional:  Negative for appetite change, chills, fatigue, fever and unexpected weight change.  HENT:   Negative for hearing loss, lump/mass and trouble swallowing.   Eyes:  Negative for eye problems and icterus.  Respiratory:  Negative for chest tightness, cough and shortness of breath.   Cardiovascular:  Negative for chest pain, leg swelling and palpitations.  Gastrointestinal:  Negative for abdominal distention, abdominal pain, constipation, diarrhea, nausea and vomiting.  Endocrine: Negative for hot flashes.  Genitourinary:  Negative for difficulty urinating.   Musculoskeletal:  Negative for arthralgias.  Skin:  Negative for itching and rash.  Neurological:  Negative for dizziness, extremity weakness, headaches and numbness.  Hematological:  Negative for adenopathy. Does not bruise/bleed easily.  Psychiatric/Behavioral:  Negative for depression. The patient is nervous/anxious.       PHYSICAL EXAMINATION  ECOG  PERFORMANCE STATUS: 1 - Symptomatic but completely ambulatory  Vitals:   12/20/21 1115  BP: (!) 118/94  Pulse: 83  Resp: 18  Temp: (!) 97.4 F (36.3 C)  SpO2: 97%    Physical Exam Constitutional:      General: She is not in acute distress.    Appearance: Normal appearance. She is not toxic-appearing.  HENT:     Head: Normocephalic and atraumatic.  Eyes:     General: No scleral icterus. Cardiovascular:     Rate and Rhythm: Normal rate and regular rhythm.     Pulses: Normal pulses.     Heart sounds: Normal heart sounds.  Pulmonary:     Effort: Pulmonary effort is normal.     Breath sounds: Normal breath sounds.  Abdominal:     General: Abdomen is flat. Bowel sounds are normal. There is no distension.     Palpations: Abdomen is soft.     Tenderness: There is no abdominal tenderness.  Musculoskeletal:  General: No swelling.     Cervical back: Neck supple.  Lymphadenopathy:     Cervical: No cervical adenopathy.  Skin:    General: Skin is warm and dry.     Findings: No rash.  Neurological:     General: No focal deficit present.     Mental Status: She is alert.  Psychiatric:        Mood and Affect: Mood normal.        Behavior: Behavior normal.     LABORATORY DATA: Not at this time   ASSESSMENT and PLAN:   Recurrent breast cancer, right (HCC) Linda Gutierrez is a 46 year old woman with history of right-sided estrogen positive breast cancer diagnosed in 2015 along with CHEK2 mutation status post bilateral mastectomies, adjuvant chemotherapy, and adjuvant antiestrogen therapy with tamoxifen x5 years completing in June 2020.  Her recent imaging and pathology results are consistent with recurrent breast cancer that is estrogen progesterone positive.  Her MRI is concerning for possible hepatic metastases therefore we need to do a PET scan to evaluate the extent of involvement of the disease to it can guide her therapy.  She is tentatively scheduled for evaluation by Dr.  Isidore Moos on July 21 since she has right axillary lymph node involvement along with lumpectomy and axillary node dissection which is scheduled with Dr. Barry Dienes on July 25.  Linda Gutierrez met with Dr. Lindi Adie who recommended PET scan.  She will start Tamoxifen daily.  We are going to check labs with [facility], estradiol, and LH.    We will call her with pet scan appt.  She will return in one week (tentatively for f/u on pet scan results).     All questions were answered. The patient knows to call the clinic with any problems, questions or concerns. We can certainly see the patient much sooner if necessary.   Wilber Bihari, NP 12/20/21 12:33 PM Medical Oncology and Hematology [facility] [place], [place] [address] Tel. [phone]    Fax. [phone]  *Total Encounter Time as defined by the Centers for Medicare and Medicaid Services includes, in addition to the face-to-face time of a patient visit (documented in the note above) non-face-to-face time: obtaining and reviewing outside history, ordering and reviewing medications, tests or procedures, care coordination (communications with other health care professionals or caregivers) and documentation in the medical record.  Attending Note  I personally saw and examined Lakelynn Drolet. The plan of care was discussed with her. I agree with the physical exam findings and assessment and plan as documented above. I performed the majority of the counseling and assessment and plan regarding this encounter Recurrent right breast cancer: ER 100% PR 100% HER2 negative, breast MRI 12/18/2021: Multiple breast masses and multiple axillary lymph nodes and subpectoral lymph node.  Suspicious lesions in the liver concerning for metastatic disease. Plan to obtain a PET CT scan to evaluate for distant metastatic disease. Start patient on tamoxifen and obtain blood work to rule out menopause with [place] and estradiol today. Consult Dr. Berline Lopes for  oophorectomies Plan is currently for Dr. Barry Dienes to perform surgery with axillary node dissection (however this will depend on whether she has distant metastases) If she does have distant metastatic disease then we would switch her treatment to Zoladex versus ovarian suppression plus AI plus CDK 4 and 6 inhibitor Signed Harriette Ohara, MD

## 2021-12-21 LAB — FOLLICLE STIMULATING HORMONE: FSH: 6.9 m[IU]/mL

## 2021-12-23 ENCOUNTER — Telehealth: Payer: Self-pay

## 2021-12-23 NOTE — Telephone Encounter (Signed)
Patient had a pre-op appointment scheduled with her surgeon at the same time as original appointment with Wilber Bihari, NP to review PET scan results. Patient given option to reschedule with either Dr. Lindi Adie or Wilber Bihari, NP but patient declined citing cost. She wanted to have surgeon to review PET scan results.  Patient knows to call if she changes her mind.

## 2021-12-24 ENCOUNTER — Other Ambulatory Visit: Payer: Self-pay | Admitting: Radiation Oncology

## 2021-12-24 ENCOUNTER — Ambulatory Visit
Admission: RE | Admit: 2021-12-24 | Discharge: 2021-12-24 | Disposition: A | Discharge status: Home / Self Care | Payer: Self-pay | Source: Ambulatory Visit | Attending: Radiation Oncology | Admitting: Radiation Oncology

## 2021-12-24 DIAGNOSIS — C50411 Malignant neoplasm of upper-outer quadrant of right female breast: Secondary | ICD-10-CM

## 2021-12-25 ENCOUNTER — Ambulatory Visit (HOSPITAL_COMMUNITY)
Admission: RE | Admit: 2021-12-25 | Discharge: 2021-12-25 | Disposition: A | Discharge status: Home / Self Care | Payer: 59 | Source: Ambulatory Visit | Attending: Adult Health | Admitting: Adult Health

## 2021-12-25 DIAGNOSIS — C50911 Malignant neoplasm of unspecified site of right female breast: Secondary | ICD-10-CM | POA: Diagnosis not present

## 2021-12-25 LAB — ESTRADIOL, ULTRA SENS: Estradiol, Sensitive: 62.7 pg/mL

## 2021-12-25 LAB — GLUCOSE, CAPILLARY: Glucose-Capillary: 98 mg/dL (ref 70–99)

## 2021-12-25 MED ORDER — FLUDEOXYGLUCOSE F - 18 (FDG) INJECTION
11.9000 | Freq: Once | INTRAVENOUS | Status: AC
  Administered 2021-12-25: 11.83 via INTRAVENOUS

## 2021-12-25 NOTE — Progress Notes (Signed)
Surgical Instructions    Your procedure is scheduled on Tuesday 12/31/21.   Report to [facility] Main Entrance "A" at 11:15 A.M., then check in with the Admitting office.  Call this number if you have problems the morning of surgery:  [phone]   If you have any questions prior to your surgery date call [phone]: Open Monday-Friday 8am-4pm    Remember:  Do not eat after midnight the night before your surgery  You may drink clear liquids until 10:15 A.M. the morning of your surgery.   Clear liquids allowed are: Water, Non-Citrus Juices (without pulp), Carbonated Beverages, Clear Tea, Black Coffee ONLY (NO MILK, CREAM OR POWDERED CREAMER of any kind), and Gatorade    Take these medicines the morning of surgery with A SIP OF WATER:   loratadine (CLARITIN)   sertraline (ZOLOFT)  tamoxifen (NOLVADEX)    Take these medicines if needed:   albuterol (VENTOLIN HFA) 108 (90 Base)  busPIRone (BUSPAR) 10 MG tablet   As of today, STOP taking any Aspirin (unless otherwise instructed by your surgeon) Aleve, Naproxen, Ibuprofen, Motrin, Advil, Goody's, BC's, all herbal medications, fish oil, and all vitamins.           Do not wear jewelry or makeup Do not wear lotions, powders, perfumes/colognes, or deodorant. Do not shave 48 hours prior to surgery.  Men may shave face and neck. Do not bring valuables to the hospital. Do not wear nail polish, gel polish, artificial nails, or any other type of covering on natural nails (fingers and toes) If you have artificial nails or gel coating that need to be removed by a nail salon, please have this removed prior to surgery. Artificial nails or gel coating may interfere with anesthesia's ability to adequately monitor your vital signs.  [facility] is not responsible for any belongings or valuables. .   Do NOT Smoke (Tobacco/Vaping)  24 hours prior to your procedure  If you use a CPAP at night, you may bring your mask for your overnight stay.    Contacts, glasses, hearing aids, dentures or partials may not be worn into surgery, please bring cases for these belongings   For patients admitted to the hospital, discharge time will be determined by your treatment team.   Patients discharged the day of surgery will not be allowed to drive home, and someone needs to stay with them for 24 hours.   SURGICAL WAITING ROOM VISITATION Patients having surgery or a procedure may have no more than 2 support people in the waiting area - these visitors may rotate.   Children under the age of 56 must have an adult with them who is not the patient. If the patient needs to stay at the hospital during part of their recovery, the visitor guidelines for inpatient rooms apply. Pre-op nurse will coordinate an appropriate time for 1 support person to accompany patient in pre-op.  This support person may not rotate.   Please refer to the [facility] website for the visitor guidelines for Inpatients (after your surgery is over and you are in a regular room).    Special instructions:    Oral Hygiene is also important to reduce your risk of infection.  Remember - BRUSH YOUR TEETH THE MORNING OF SURGERY WITH YOUR REGULAR TOOTHPASTE   [place]- Preparing For Surgery  Before surgery, you can play an important role. Because skin is not sterile, your skin needs to be as free of germs as possible. You can reduce the number of  germs on your skin by washing with CHG (chlorahexidine gluconate) Soap before surgery.  CHG is an antiseptic cleaner which kills germs and bonds with the skin to continue killing germs even after washing.     Please do not use if you have an allergy to CHG or antibacterial soaps. If your skin becomes reddened/irritated stop using the CHG.  Do not shave (including legs and underarms) for at least 48 hours prior to first CHG shower. It is OK to shave your face.  Please follow these instructions carefully.     Shower the NIGHT BEFORE  SURGERY and the MORNING OF SURGERY with CHG Soap.   If you chose to wash your hair, wash your hair first as usual with your normal shampoo. After you shampoo, rinse your hair and body thoroughly to remove the shampoo.  Then [employer] and genitals (private parts) with your normal soap and rinse thoroughly to remove soap.  After that Use CHG Soap as you would any other liquid soap. You can apply CHG directly to the skin and wash gently with a scrungie or a clean washcloth.   Apply the CHG Soap to your body ONLY FROM THE NECK DOWN.  Do not use on open wounds or open sores. Avoid contact with your eyes, ears, mouth and genitals (private parts). Wash Face and genitals (private parts)  with your normal soap.   Wash thoroughly, paying special attention to the area where your surgery will be performed.  Thoroughly rinse your body with warm water from the neck down.  DO NOT shower/wash with your normal soap after using and rinsing off the CHG Soap.  Pat yourself dry with a CLEAN TOWEL.  Wear CLEAN PAJAMAS to bed the night before surgery  Place CLEAN SHEETS on your bed the night before your surgery  DO NOT SLEEP WITH PETS.   Day of Surgery:  Take a shower with CHG soap. Wear Clean/Comfortable clothing the morning of surgery Do not apply any deodorants/lotions.   Remember to brush your teeth WITH YOUR REGULAR TOOTHPASTE.    If you received a COVID test during your pre-op visit, it is requested that you wear a mask when out in public, stay away from anyone that may not be feeling well, and notify your surgeon if you develop symptoms. If you have been in contact with anyone that has tested positive in the last 10 days, please notify your surgeon.    Please read over the following fact sheets that you were given.

## 2021-12-26 ENCOUNTER — Other Ambulatory Visit: Payer: Self-pay | Admitting: General Surgery

## 2021-12-26 ENCOUNTER — Encounter (HOSPITAL_COMMUNITY)
Admission: RE | Admit: 2021-12-26 | Discharge: 2021-12-26 | Disposition: A | Discharge status: Home / Self Care | Payer: 59 | Source: Ambulatory Visit | Attending: General Surgery | Admitting: General Surgery

## 2021-12-26 ENCOUNTER — Encounter: Payer: Self-pay | Admitting: Adult Health

## 2021-12-26 ENCOUNTER — Other Ambulatory Visit: Payer: Self-pay

## 2021-12-26 ENCOUNTER — Ambulatory Visit: Payer: 59 | Admitting: Adult Health

## 2021-12-26 ENCOUNTER — Encounter (HOSPITAL_COMMUNITY): Payer: Self-pay

## 2021-12-26 DIAGNOSIS — C50911 Malignant neoplasm of unspecified site of right female breast: Secondary | ICD-10-CM

## 2021-12-26 DIAGNOSIS — C50411 Malignant neoplasm of upper-outer quadrant of right female breast: Secondary | ICD-10-CM

## 2021-12-26 DIAGNOSIS — Z01818 Encounter for other preprocedural examination: Secondary | ICD-10-CM | POA: Diagnosis present

## 2021-12-26 DIAGNOSIS — R160 Hepatomegaly, not elsewhere classified: Secondary | ICD-10-CM

## 2021-12-26 HISTORY — DX: Unspecified asthma, uncomplicated: J45.909

## 2021-12-26 HISTORY — DX: Pneumonia, unspecified organism: J18.9

## 2021-12-26 HISTORY — DX: Personal history of other diseases of the digestive system: Z87.19

## 2021-12-26 NOTE — Progress Notes (Signed)
PCP - Janie Morning  Cardiologist - denies Oncologist: Lindi Adie  PPM/ICD - denies   Chest x-ray - n/a EKG - n/a Stress Test -denies  ECHO - denies Cardiac Cath - denies  Sleep Study - denies   Follow your surgeon's instructions on when to stop Aspirin.  If no instructions were given by your surgeon then you will need to call the office to get those instructions.     ERAS Protcol -yes   COVID TEST- no   Anesthesia review: no  Patient denies shortness of breath, fever, cough and chest pain at PAT appointment   All instructions explained to the patient, with a verbal understanding of the material. Patient agrees to go over the instructions while at home for a better understanding. Patient also instructed to self quarantine after being tested for COVID-19. The opportunity to ask questions was provided.

## 2021-12-27 ENCOUNTER — Ambulatory Visit
Admission: RE | Admit: 2021-12-27 | Discharge: 2021-12-27 | Disposition: A | Discharge status: Home / Self Care | Payer: 59 | Source: Ambulatory Visit | Attending: Radiation Oncology | Admitting: Radiation Oncology

## 2021-12-27 ENCOUNTER — Telehealth: Payer: Self-pay

## 2021-12-27 ENCOUNTER — Encounter: Payer: Self-pay | Admitting: Hematology and Oncology

## 2021-12-27 ENCOUNTER — Inpatient Hospital Stay (HOSPITAL_BASED_OUTPATIENT_CLINIC_OR_DEPARTMENT_OTHER): Payer: 59 | Admitting: Hematology and Oncology

## 2021-12-27 ENCOUNTER — Encounter: Payer: Self-pay | Admitting: Radiation Oncology

## 2021-12-27 ENCOUNTER — Telehealth: Payer: Self-pay | Admitting: Pharmacy Technician

## 2021-12-27 ENCOUNTER — Other Ambulatory Visit (HOSPITAL_COMMUNITY): Payer: Self-pay

## 2021-12-27 VITALS — BP 155/107 | HR 66 | Temp 97.5°F | Resp 15 | Wt 239.4 lb

## 2021-12-27 VITALS — BP 140/85 | HR 77 | Temp 97.7°F | Resp 17 | Ht 68.0 in | Wt 238.5 lb

## 2021-12-27 DIAGNOSIS — C50919 Malignant neoplasm of unspecified site of unspecified female breast: Secondary | ICD-10-CM | POA: Diagnosis not present

## 2021-12-27 DIAGNOSIS — Z9012 Acquired absence of left breast and nipple: Secondary | ICD-10-CM | POA: Insufficient documentation

## 2021-12-27 DIAGNOSIS — Z1589 Genetic susceptibility to other disease: Secondary | ICD-10-CM | POA: Diagnosis not present

## 2021-12-27 DIAGNOSIS — C50911 Malignant neoplasm of unspecified site of right female breast: Secondary | ICD-10-CM

## 2021-12-27 DIAGNOSIS — Z87891 Personal history of nicotine dependence: Secondary | ICD-10-CM | POA: Diagnosis not present

## 2021-12-27 DIAGNOSIS — C50811 Malignant neoplasm of overlapping sites of right female breast: Secondary | ICD-10-CM | POA: Insufficient documentation

## 2021-12-27 DIAGNOSIS — C7951 Secondary malignant neoplasm of bone: Secondary | ICD-10-CM | POA: Insufficient documentation

## 2021-12-27 DIAGNOSIS — Z79811 Long term (current) use of aromatase inhibitors: Secondary | ICD-10-CM | POA: Insufficient documentation

## 2021-12-27 DIAGNOSIS — G893 Neoplasm related pain (acute) (chronic): Secondary | ICD-10-CM | POA: Insufficient documentation

## 2021-12-27 DIAGNOSIS — Z1502 Genetic susceptibility to malignant neoplasm of ovary: Secondary | ICD-10-CM | POA: Diagnosis not present

## 2021-12-27 DIAGNOSIS — R918 Other nonspecific abnormal finding of lung field: Secondary | ICD-10-CM | POA: Insufficient documentation

## 2021-12-27 DIAGNOSIS — Z170 Estrogen receptor positive status [ER+]: Secondary | ICD-10-CM | POA: Diagnosis not present

## 2021-12-27 DIAGNOSIS — C787 Secondary malignant neoplasm of liver and intrahepatic bile duct: Secondary | ICD-10-CM | POA: Insufficient documentation

## 2021-12-27 DIAGNOSIS — Z510 Encounter for antineoplastic radiation therapy: Secondary | ICD-10-CM | POA: Diagnosis not present

## 2021-12-27 DIAGNOSIS — Z1509 Genetic susceptibility to other malignant neoplasm: Secondary | ICD-10-CM

## 2021-12-27 DIAGNOSIS — Z79899 Other long term (current) drug therapy: Secondary | ICD-10-CM | POA: Insufficient documentation

## 2021-12-27 MED ORDER — ABEMACICLIB 100 MG PO TABS
100.0000 mg | ORAL_TABLET | Freq: Two times a day (BID) | ORAL | 0 refills | Status: DC
  Filled 2021-12-27: qty 56, 28d supply, fill #0

## 2021-12-27 MED ORDER — LETROZOLE 2.5 MG PO TABS: 2.5000 mg | ORAL_TABLET | Freq: Every day | ORAL | 3 refills | Status: DC

## 2021-12-27 NOTE — Telephone Encounter (Signed)
Oral Oncology Pharmacist Encounter  Received new prescription for abemaciclib (Verzenio) for the treatment of advancer HR positive, HER2 negative breast cancer in conjunction with letrozole, planned duration until disease progression or unacceptable toxicity.  Labs from 12/20/21 assessed, no interventions needed. Prescription dose and frequency assessed.   Current medication list in Epic reviewed, DDIs with Verzenio identified: none  Evaluated chart and no patient barriers to medication adherence noted.   Patient agreement for treatment documented in MD note on 12/27/21.  Prescription has been e-scribed to the [facility] for benefits analysis and approval.  Oral Oncology Clinic will continue to follow for insurance authorization, copayment issues, initial counseling and start date.  Drema Halon, PharmD Hematology/Oncology Clinical Pharmacist [facility] [phone] 12/27/2021 1:01 PM

## 2021-12-27 NOTE — Progress Notes (Signed)
Location of Breast Cancer:  Recurrent right -ided breast cancer that is estrogen progesterone positive   Histology per Pathology Report:  12/12/2021 (biopsy)    Receptor Status: ER(100%), PR (100%), Her2-neu (Negative via [place]), Ki-67(15%)   Did patient present with symptoms (if so, please note symptoms) or was this found on screening mammography?: (from Dr. Marlowe Aschoff 12/16/21 office note): "In late June 2023 she identified a palpable abnormality in the far lateral right breast and saw Mendel Ryder at the cancer center. This is a bit tender. Mammogram and ultrasound were ordered which demonstrated a 1.2 cm mass at 9:00 approximately 10 cm from the nipple. She also was found to have an enlarged lymph node in the axilla with 2 additional nodes that appeared abnormal"   **previously was diagnosed with ER/PR positive breast cancer in 2015 treated with bilateral mastectomies, adjuvant chemotherapy, and tamoxifen x5 years completing therapy in June 2020   PET Scan 12/25/2021 IMPRESSION: Small hypermetabolic right chest wall nodules and hypermetabolic right axillary adenopathy consistent with recurrent breast cancer. Numerous large hepatic metastatic lesions. Small scattered pulmonary nodules most likely pulmonary metastatic disease. Diffuse lytic/destructive osseous metastatic disease. The most critical lesion involves the right aspect of L4. Probable pathologic fracture but no canal involvement.     Past/Anticipated interventions by surgeon, if any:  12/31/2021 --Dr. Stark Klein  Scheduled for: RIGHT BREAST LUMPECTOMY WITH AXILLARY LYMPH NODE DISSECTION  Past/Anticipated interventions by medical oncology, if any:  Under care of Dr. Nicholas Lose --12/20/2021 Mendel Ryder Cause[place]'s office note) Recent imaging and pathology results are consistent with recurrent breast cancer that is estrogen progesterone positive.   Her MRI is concerning for possible hepatic metastases therefore we need to do a PET scan  to evaluate the extent of involvement of the disease to it can guide her therapy. She is tentatively scheduled for evaluation by Dr. Isidore Moos on July 21 since she has right axillary lymph node involvement along with lumpectomy and axillary node dissection which is scheduled with Dr. Barry Dienes on July 25. [name] met with Dr. Lindi Adie who recommended PET scan.   She will start Tamoxifen daily.   We are going to check labs with [facility], estradiol, and LH.   We will call her with pet scan appt.   She will return in one week (tentatively for f/u on pet scan results) Attending Note Recurrent right breast cancer: ER 100% PR 100% HER2 negative, breast MRI 12/18/2021: Multiple breast masses and multiple axillary lymph nodes and subpectoral lymph node.  Suspicious lesions in the liver concerning for metastatic disease. Plan to obtain a PET CT scan to evaluate for distant metastatic disease. Start patient on tamoxifen and obtain blood work to rule out menopause with [place] and estradiol today. Consult Dr. Berline Lopes for oophorectomies Plan is currently for Dr. Barry Dienes to perform surgery with axillary node dissection (however this will depend on whether she has distant metastases) If she does have distant metastatic disease then we would switch her treatment to Zoladex versus ovarian suppression plus AI plus CDK 4 and 6 inhibitor   --CHEK2-related breast cancer 11/25/2013-11/2018 Received neoadjuvant chemotherapy with Adriamycin and Cytoxan x4.   She then had surgery and then received adjuvant therapy with Taxol weekly x12 5 years of adjuvant antiestrogen therapy with tamoxifen completed June 2020   Lymphedema issues, if any:  Patient denies     Pain issues, if any:  Reports occasional discomfort to her right breast.     SAFETY ISSUES: Prior radiation? No Pacemaker/ICD? No Possible current pregnancy? No--hysterectomy  Is the patient on methotrexate? No   Current Complaints / other details:  Nothing else of note

## 2021-12-27 NOTE — Telephone Encounter (Signed)
Oral Oncology Patient Advocate Encounter  Prior Authorization for Melynda Keller has been approved.    PA# [id number] Effective dates: 12/27/2021 through 12/28/2022  Patient must fill at [facility].    Lady Deutscher, CPhT-Adv Pharmacy Patient Advocate Specialist [place] Patient Advocate Team Direct Number: [phone]  Fax: [phone]

## 2021-12-27 NOTE — Progress Notes (Signed)
Radiation Oncology         [phone] ________________________________  Initial Outpatient Consultation  Name: Linda Gutierrez MRN: [id number]  Date: 12/27/2021  DOB: 04-19-1976  QI:[id number], Hinton Dyer, DO  Stark Klein, MD   REFERRING PHYSICIAN: Stark Klein, MD  DIAGNOSIS:    ICD-10-CM   1. Recurrent breast cancer, right ([place])  C50.911     2. Cancer, metastatic to bone [facility])  C79.51      STAGE IV Breast CANCER  Right Breast UOQ Invasive ductal carcinoma, ER+ / PR+ / Her2 pending, Grade 3  History of CHEK2 right breast cancer in 2015 s/p bilateral mastectomies followed by reconstructive surgery, neoadjuvant chemotherapy with Adriamycin and Cytoxan x4, and tamoxifen completed in June of 2020  CHIEF COMPLAINT: Here to discuss management of right breast cancer  HISTORY OF PRESENT ILLNESS::Linda Gutierrez is a 46 y.o. female with a history of right breast cancer in 2015, s/p bilateral mastectomies followed by reconstructive surgery, neoadjuvant chemotherapy with Adriamycin and Cytoxan x4, and tamoxifen completed in June of 2020. She did not require radiation.  The patient recently presented with a new right palpable breast lump x 1 week in late June 2023. Subsequent right breast ultrasound on 12/12/21 revealed a 1.1 x 1.2 . 0.5 cm irregular mass in the 9 o'clock right breast, 10 cmfn, highly suggestive of malignancy. 3 abnormal lymph nodes were also appreciated in the right axilla.   Biopsy of the 9 o'clock right breast on date of 12/12/21 showed grade 3 invasive ductal carcinoma measuring 1 cm in the greatest linear extent.  ER status: 100% positive and PR status 100% positive, both with strong staining intensity, Her2 status 15%; Grade 3. Biopsies of a right axillary lymph node was positive for carcinoma without nodal tissue identified.  Bilateral breast MRI on 12/18/21 showed 3 irregular enhancing masses within the outer aspect of the reconstructed right breast (one of which was the  biopsies mass). MRI also showed multiple abnormal right axillary lymph nodes and subpectoral lymphadenopathy compatible with nodal metastasis, and evidence of possible multiple hepatic masses.   The patient has met with Dr. Barry Dienes and is scheduled to undergo right breast lumpectomy with nodal biopsies on 12/31/21. The patient also recently met with Dr. Lindi Adie on 12/20/21 who recommended a PET scan and initiation of tamoxifen daily.   The patient had a PET scan performed yesterday, and I personally shared the images and results with her.  Is highly suspicious for metastatic disease to the liver and bones.  There are questionable nodules in her lungs that are less conclusive.   She is having pain in the lower lumbar region that corresponds with what appears to be bony metastatic disease and also right hip and right pelvic pain that corresponds to the bony lesions on this PET scan.  She has an appointment later with medical oncology today and had been scheduled for surgery next week for her chest wall relapse  She denies any headaches or neurologic symptoms  PREVIOUS RADIATION THERAPY: No  PAST MEDICAL HISTORY:  has a past medical history of Abnormal Pap smear of cervix (2016), Asthma, Breast cancer ([place]) (2015), Depression, History of hiatal hernia, Pneumonia, STD (sexually transmitted disease), and Urinary incontinence.    PAST SURGICAL HISTORY: Past Surgical History:  Procedure Laterality Date   BREAST RECONSTRUCTION     BREAST RECONSTRUCTION     with implants   CERVICAL BIOPSY  W/ LOOP ELECTRODE EXCISION     COLPOSCOPY     MASTECTOMY Bilateral 2017  TOTAL VAGINAL HYSTERECTOMY      FAMILY HISTORY: family history includes Diabetes in her father and maternal grandmother; Heart disease in her maternal grandmother; Hypertension in her father; Lupus in her father; Thyroid disease in her mother; Uterine cancer in her mother.  SOCIAL HISTORY:  reports that she quit smoking about 8 years ago. Her  smoking use included cigarettes. She has a 5.00 pack-year smoking history. She has never used smokeless tobacco. She reports current alcohol use of about 6.0 standard drinks of alcohol per week. She reports that she does not use drugs.  ALLERGIES: Patient has no known allergies.  MEDICATIONS:  Current Outpatient Medications  Medication Sig Dispense Refill   abemaciclib (VERZENIO) 100 MG tablet Take 1 tablet (100 mg total) by mouth 2 (two) times daily. 56 tablet 0   albuterol (VENTOLIN HFA) 108 (90 Base) MCG/ACT inhaler Inhale 1-2 puffs into the lungs every 6 (six) hours as needed for wheezing or shortness of breath.     ASHWAGANDHA PO Take 1 capsule by mouth daily.     busPIRone (BUSPAR) 10 MG tablet Take 10 mg by mouth 2 (two) times daily as needed (anxiety).     cholecalciferol (VITAMIN D3) 25 MCG (1000 UNIT) tablet Take 1,000 Units by mouth daily.     letrozole (FEMARA) 2.5 MG tablet Take 1 tablet (2.5 mg total) by mouth daily. 90 tablet 3   loratadine (CLARITIN) 10 MG tablet Take 10 mg by mouth daily.     MAGNESIUM GLYCINATE PO Take 1 capsule by mouth daily.     sertraline (ZOLOFT) 100 MG tablet Take 100 mg by mouth daily.     tamoxifen (NOLVADEX) 20 MG tablet Take 1 tablet (20 mg total) by mouth daily. 90 tablet 0   No current facility-administered medications for this encounter.    REVIEW OF SYSTEMS: As above in HPI.   PHYSICAL EXAM:  height is 5' 8"  (1.727 m) and weight is 238 lb 8 oz (108.2 kg). Her oral temperature is 97.7 F (36.5 C). Her blood pressure is 140/85 and her pulse is 77. Her respiration is 17 and oxygen saturation is 98%.   General: Alert and oriented, mildly tearful at times HEENT: Head is normocephalic. Extraocular movements are intact.   Neurologic: Cranial nerves II through XII are grossly intact. No obvious focalities. Speech is fluent. Coordination is intact. Psychiatric: Judgment and insight are intact. Affect is appropriate. MSK: Tenderness in lower lumbar  region.  She is ambulatory with good muscle tone.  Well-nourished.    ECOG = 1  0 - Asymptomatic (Fully active, able to carry on all predisease activities without restriction)  1 - Symptomatic but completely ambulatory (Restricted in physically strenuous activity but ambulatory and able to carry out work of a light or sedentary nature. For example, light housework, office work)  2 - Symptomatic, <50% in bed during the day (Ambulatory and capable of all self care but unable to carry out any work activities. Up and about more than 50% of waking hours)  3 - Symptomatic, >50% in bed, but not bedbound (Capable of only limited self-care, confined to bed or chair 50% or more of waking hours)  4 - Bedbound (Completely disabled. Cannot carry on any self-care. Totally confined to bed or chair)  5 - Death   Eustace Pen MM, Creech RH, Tormey DC, et al. [phone]). "Toxicity and response criteria of the [facility]". [place] Oncol. 5 (6): 649-55   LABORATORY DATA:  Lab Results  Component Value Date   WBC 8.9 12/20/2021   HGB 15.2 (H) 12/20/2021   HCT 42.6 12/20/2021   MCV 91.6 12/20/2021   PLT 248 12/20/2021   CMP     Component Value Date/Time   NA 138 12/20/2021 1246   K 4.2 12/20/2021 1246   CL 106 12/20/2021 1246   CO2 26 12/20/2021 1246   GLUCOSE 114 (H) 12/20/2021 1246   BUN 12 12/20/2021 1246   CREATININE 0.71 12/20/2021 1246   CALCIUM 9.4 12/20/2021 1246   PROT 7.8 12/20/2021 1246   ALBUMIN 4.1 12/20/2021 1246   AST 41 12/20/2021 1246   ALT 48 (H) 12/20/2021 1246   ALKPHOS 103 12/20/2021 1246   BILITOT 0.7 12/20/2021 1246   GFRNONAA >60 12/20/2021 1246         RADIOGRAPHY: NM PET Image Initial (PI) Skull Base To Thigh  Result Date: 12/26/2021 CLINICAL DATA:  Initial treatment strategy for metastatic breast cancer. Remote mastectomies. Recent right breast biopsy 12/13/2021. EXAM: NUCLEAR MEDICINE PET SKULL BASE TO THIGH TECHNIQUE: 11.83 mCi F-18 FDG was  injected intravenously. Full-ring PET imaging was performed from the skull base to thigh after the radiotracer. CT data was obtained and used for attenuation correction and anatomic localization. Fasting blood glucose: 98 mg/dl COMPARISON:  Breast MRI 12/18/2021. No prior body imaging otherwise. FINDINGS: Mediastinal blood pool activity: SUV max 1.07 Liver activity: SUV max NA NECK: No hypermetabolic lymph nodes in the neck. Incidental CT findings: none CHEST: Small hypermetabolic right chest wall nodules near the lower and lateral aspect of the right breast prosthesis. These are also evident on the breast MR. The largest lesion measures 9 mm and the SUV max is 2.60. Large right axillary node measuring 3.9 x 2.1 cm is hypermetabolic with SUV max of 6.16. No left-sided breast lesions. No enlarged or hypermetabolic mediastinal or hilar nodes to suggest metastatic adenopathy in the chest. Small scattered sub 5 mm pulmonary nodules worrisome for pulmonary metastatic disease. Incidental CT findings: none ABDOMEN/PELVIS: Numerous large low-attenuation lesions are noted in the liver. These lesions are hypermetabolic and consistent with widespread hepatic metastatic disease. 5.6 cm segment 5/6 lesion has an SUV max of 5.01. Three adjacent lesions in segment 8/7 measures a total of 8.3 x 7.4 cm and the SUV max is 4.96. No adrenal gland metastasis.  No abdominal/pelvic lymphadenopathy. Incidental CT findings: none SKELETON: Diffuse osseous metastatic disease. Lytic lesion involving the right lamina and transverse process of T11 has an SUV max of 5.47. Large destructive lytic lesion involving the right aspect of L4 also invading the right pedicle. Possible pathologic fracture but no canal involvement. SUV max is 7.05. 2.6 cm lytic lesion involving the right iliac bone has an SUV max of 4.66 Large lytic lesion involving the left sacrum has an SUV max of 5.95. Lytic lesion involving the posterior right acetabulum has an SUV max  of 4.52. Several other smaller lytic lesions are noted in the pelvis. Incidental CT findings: none IMPRESSION: 1. Small hypermetabolic right chest wall nodules and hypermetabolic right axillary adenopathy consistent with recurrent breast cancer. 2. Numerous large hepatic metastatic lesions. 3. Small scattered pulmonary nodules most likely pulmonary metastatic disease. 4. Diffuse lytic/destructive osseous metastatic disease. The most critical lesion involves the right aspect of L4. Probable pathologic fracture but no canal involvement. Electronically Signed   By: Marijo Sanes M.D.   On: 12/26/2021 17:30   MR BREAST BILATERAL W WO CONTRAST INC CAD  Result Date: 12/19/2021 CLINICAL DATA:  Patient with  history of bilateral mastectomies in 2015 presenting with recurrent right breast cancer. EXAM: BILATERAL BREAST MRI WITH AND WITHOUT CONTRAST TECHNIQUE: Multiplanar, multisequence MR images of both breasts were obtained prior to and following the intravenous administration of 10 ml of Gadavist Three-dimensional MR images were rendered by post-processing of the original MR data on an independent workstation. The three-dimensional MR images were interpreted, and findings are reported in the following complete MRI report for this study. Three dimensional images were evaluated at the independent interpreting workstation using the DynaCAD thin client. COMPARISON:  Previous exam(s). FINDINGS: Breast composition: a. Almost entirely fat. Background parenchymal enhancement: Mild Right breast: Patient is status post right mastectomy and reconstruction. Within the upper-outer right reconstructed breast there is a 2.4 x 1.6 cm mass and associated non mass enhancement (image 58; series 10). Within the outer right breast middle depth there is a 2.2 x 1.5 cm irregular enhancing mass extending from the implant to the overlying skin (image 79; series 10). This appears to contain a biopsy marking clip. Within the lower outer posterior  reconstructed right breast there is a 2.5 x 1.5 cm irregular enhancing mass (image 102; series 10). Left breast: No mass or abnormal enhancement. Lymph nodes: There are multiple abnormal appearing enlarged right axillary lymph nodes measuring up to 2.4 cm. There is an adjacent 1.0 cm right axillary node (image 26; series 10). Thickened abnormal subpectoral right axillary nodes measuring up to 1.3 cm (image 9; series 12). Ancillary findings: Within the central aspect of the liver there is suggestion of a 2.5 x 2.8 cm mass and an adjacent 2.5 x 2.0 cm mass (image 126; series 10). Additional possible mass within the hepatic dome measuring 2.8 cm (image 111; series 10). IMPRESSION: 1. There are 3 irregular enhancing masses within the outer aspect of the reconstructed right breast. One of these masses was biopsied with ultrasound guidance compatible with breast carcinoma 2. Multiple abnormal right axillary and subpectoral lymphadenopathy compatible with nodal metastasis 3. There is suggestion of multiple hepatic masses, the possibility of hepatic metastatic disease is not excluded. This needs dedicated evaluation with contrast-enhanced CT or MRI. 4. These results will be called to the ordering clinician or representative by the Radiologist Assistant, and communication documented in the PACS or [employer]. RECOMMENDATION: Next steps are dependent upon treatment/surgical plan. If needed for surgical planning, second-look ultrasound and or biopsy of the 2 additional masses within the outer right breast could be performed. Otherwise continue with treatment plan for right breast carcinoma and axillary metastatic adenopathy. Contrast-enhanced CT or MRI for further evaluation of the hepatic masses. BI-RADS CATEGORY  6: Known biopsy-proven malignancy. Electronically Signed   By: Lovey Newcomer M.D.   On: 12/19/2021 13:01     IMPRESSION/PLAN: This is a wonderful 46 year old woman with a previous history of breast cancer, now  presenting with diffuse metastatic disease, particularly in the liver and bones.  We had an in-depth conversation which included the patient's family.  I shared her images and explained the seriousness of her disease.  She understands that her disease is metastatic and not curable but is treatable.  I explained to her that she will need to finalize her overall plan with Dr. Lindi Adie today but I anticipate that he will cancel her surgery and want to start systemic therapy relatively soon.  Radiation therapy will not improve her prognosis right now if we treat the right chest wall or adjacent lymph nodes where she presented with her initial symptoms of relapse.  But it will potentially help her quality of life and have palliative benefit if we target the areas where she has bone pain.  We talked in depth about delivering radiation to the lower spine and painful bony metastases in her pelvis and right hip.  She does not have any left pelvic pain and she understands that we will not necessarily target every visible tumor in the skeleton that appears on her PET scan but rather target the areas where she is likely to have corresponding symptoms.  We can fit her in for treatment planning later this morning so that we can turn around her plan and start  next week.  She will receive 10 fractions at 3 Gray per fraction to a total dose of 30 Gray.   We spoke about acute effects including skin irritation and fatigue and possible queasiness and/or bowel irritation as well as much less common late effects including internal organ injury or irritation in the abdomen and pelvis.  She understands that her bones are weaker by virtue of the cancer but it is okay for her to do mild to moderate exercise as long as she avoids heavy weightlifting or strenuous weightbearing activity. We spoke about the latest technology that is used to minimize the risk of late effects for patients undergoing radiotherapy. No guarantees of treatment  were given. The patient is enthusiastic about proceeding with treatment. I look forward to participating in the patient's care.   Consent form has been signed and placed in her chart  On date of service, in total, I spent 60 minutes on this encounter. Patient was seen in person.   __________________________________________   Eppie Gibson, MD  This document serves as a record of services personally performed by Eppie Gibson, MD. It was created on her behalf by Roney Mans, a trained medical scribe. The creation of this record is based on the scribe's personal observations and the provider's statements to them. This document has been checked and approved by the attending provider.

## 2021-12-27 NOTE — Progress Notes (Signed)
Patient Care Team: Janie Morning, DO as PCP - General (Family Medicine) Nicholas Lose, MD as Consulting Physician (Hematology and Oncology) Stark Klein, MD as Consulting Physician (General Surgery)  DIAGNOSIS:  Encounter Diagnoses  Name Primary?   CHEK2-related breast cancer ([place]) Yes   Recurrent breast cancer, right ([place])     SUMMARY OF ONCOLOGIC HISTORY: Oncology History  CHEK2-related breast cancer ([place])  11/25/2013 Initial Diagnosis   CHEK2-related breast cancer [facility])  May 2015 she noted a lump in the right breast at 2 o'clock. MRI Breast (11/21/13): large geographic areas of the right breast malignancy within the 6 to 8 o'clock position right breast lower outer quadrant extending from the chest wall to the retroareolar area of the right breast. Multiple suspicious nodules within the right axilla. 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    04/18/2014 Surgery   Bilateral mastectomies  Right: DCIS but no residual invasive carcinoma. Margins were uninvolved. Sentinel lymph node had a focus of metastatic carcinoma measuring 0.4 cm surgical stage was Tis N1A. Left: benign     Chemotherapy   Received neoadjuvant chemotherapy with Adriamycin and Cytoxan x4.  She then had surgery and then received adjuvant therapy with Taxol weekly x12        - 11/2018 Anti-estrogen oral therapy   Adjuvant antiestrogen therapy with tamoxifen completed June 2020   12/18/2021 Relapse/Recurrence   Palpable lesion in the right breast.  Mammogram and ultrasound at [facility] revealed recurrent nodules in the reconstructed right breast along with enlarged lymph nodes.  Biopsy of the breast and the lymph nodes was performed that revealed breast cancer that was ER/PR 100% positive HER2 negative.  Breast MRI revealed 3 masses in the right breast measuring 2.4, 2.2, 2.5 cm multiple enlarged axillary lymph nodes  measuring up to 2.4 cm subpectoral lymph node 1.3 cm, liver 2.8 cm, 2.5 cm and 2.8 cm masses     CHIEF COMPLIANT: Follow-up to discuss Pet scan  INTERVAL HISTORY: Linda Gutierrez is a 46 y.o. female is here because of a history of CHEK2-related breast cancer. She presents to the clinic today for a follow-up to discuss pet scan.  She has a lot of pain in her lower back as well as the hip.  She is accompanied by her mother and saw radiation oncology who recommended palliative radiation to the back.   ALLERGIES:  has No Known Allergies.  MEDICATIONS:  Current Outpatient Medications  Medication Sig Dispense Refill   abemaciclib (VERZENIO) 100 MG tablet Take 1 tablet (100 mg total) by mouth 2 (two) times daily. 56 tablet 0   letrozole (FEMARA) 2.5 MG tablet Take 1 tablet (2.5 mg total) by mouth daily. 90 tablet 3   albuterol (VENTOLIN HFA) 108 (90 Base) MCG/ACT inhaler Inhale 1-2 puffs into the lungs every 6 (six) hours as needed for wheezing or shortness of breath.     ASHWAGANDHA PO Take 1 capsule by mouth daily.     busPIRone (BUSPAR) 10 MG tablet Take 10 mg by mouth 2 (two) times daily as needed (anxiety).     cholecalciferol (VITAMIN D3) 25 MCG (1000 UNIT) tablet Take 1,000 Units by mouth daily.     loratadine (CLARITIN) 10 MG tablet Take 10 mg by mouth daily.     MAGNESIUM GLYCINATE PO Take 1 capsule by mouth daily.     sertraline (ZOLOFT) 100 MG tablet Take 100 mg by mouth daily.  tamoxifen (NOLVADEX) 20 MG tablet Take 1 tablet (20 mg total) by mouth daily. 90 tablet 0   No current facility-administered medications for this visit.    PHYSICAL EXAMINATION: ECOG PERFORMANCE STATUS: 1 - Symptomatic but completely ambulatory  Vitals:   12/27/21 1206  BP: (!) 155/107  Pulse: 66  Resp: 15  Temp: (!) 97.5 F (36.4 C)  SpO2: 100%   Filed Weights   12/27/21 1206  Weight: 239 lb 6.4 oz (108.6 kg)     LABORATORY DATA:  I have reviewed the data as listed    Latest Ref Rng &  Units 12/20/2021   12:46 PM  CMP  Glucose 70 - 99 mg/dL 114   BUN 6 - 20 mg/dL 12   Creatinine 0.44 - 1.00 mg/dL 0.71   Sodium 135 - 145 mmol/L 138   Potassium 3.5 - 5.1 mmol/L 4.2   Chloride 98 - 111 mmol/L 106   CO2 22 - 32 mmol/L 26   Calcium 8.9 - 10.3 mg/dL 9.4   Total Protein 6.5 - 8.1 g/dL 7.8   Total Bilirubin 0.3 - 1.2 mg/dL 0.7   Alkaline Phos 38 - 126 U/L 103   AST 15 - 41 U/L 41   ALT 0 - 44 U/L 48     Lab Results  Component Value Date   WBC 8.9 12/20/2021   HGB 15.2 (H) 12/20/2021   HCT 42.6 12/20/2021   MCV 91.6 12/20/2021   PLT 248 12/20/2021   NEUTROABS 5.6 12/20/2021    ASSESSMENT & PLAN:  Recurrent breast cancer, right [facility]) May 2015: Right breast lump, mammogram ultrasound and MRI showed multiple suspicious nodules within the right axilla.  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, lymph node biopsy: Positive, ER 100%, PR 100%, Ki-67 15%, HER2 equivocal 2+, FISH: 12/26/2021: PET/CT: Small hypermetabolic right chest wall nodules and right axillary lymphadenopathy consistent with recurrent breast cancer, numerous large hepatic metastatic lesions, small scattered pulmonary nodules, diffuse lytic/destructive osseous metastatic lesions especially at L4  Given the new diagnosis of metastatic breast cancer, Recommend treatment with Zoladex with letrozole and Verzinio  Abemaciclib counseling: I discussed at length the risks and benefits of Abemaciclib in combination with letrozole. Adverse effects of Abemaciclib include decreasing neutrophil count, pneumonia, blood clots in lungs as well as  nausea and GI symptoms. Side effects of letrozole include hot flashes, muscle aches and pains, uterine bleeding/spotting/cancer, osteoporosis, risk of blood clots.  Start Verzinio at 100 mg bid. F/U with Jenny Reichmann in 2 weeks Start Zoladex next week Xgeva postponed until we get dental clearance  L4 compression fracture and pain: XRT being planned     Orders Placed This Encounter  Procedures   CBC with Differential ([place] Only)    Standing Status:   Standing    Number of Occurrences:   20    Standing Expiration Date:   12/28/2022   CMP ([place] only)    Standing Status:   Standing    Number of Occurrences:   20    Standing Expiration Date:   12/28/2022   The patient has a good understanding of the overall plan. she agrees with it. she will call with any problems that may develop before the next visit here. Total time  spent: 30 mins including face to face time and time spent for planning, charting and co-ordination of care   Harriette Ohara, MD 12/27/21    I Gardiner Coins am scribing for Dr. Lindi Adie  I have reviewed the above documentation for accuracy and completeness, and I agree with the above.

## 2021-12-27 NOTE — Assessment & Plan Note (Addendum)
May 2015: Right breast lump, mammogram ultrasound and MRI showed multiple suspicious nodules within the right axilla.  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, lymph node biopsy: Positive, ER 100%, PR 100%, Ki-67 15%, HER2 equivocal 2+, FISH: 12/26/2021: PET/CT: Small hypermetabolic right chest wall nodules and right axillary lymphadenopathy consistent with recurrent breast cancer, numerous large hepatic metastatic lesions, small scattered pulmonary nodules, diffuse lytic/destructive osseous metastatic lesions especially at L4  Given the new diagnosis of metastatic breast cancer, Recommend treatment with Zoladex with letrozole and Verzinio  Abemaciclib counseling: I discussed at length the risks and benefits of Abemaciclib in combination with letrozole. Adverse effects of Abemaciclib include decreasing neutrophil count, pneumonia, blood clots in lungs as well as nausea and GI symptoms. Side effects of letrozole include hot flashes, muscle aches and pains, uterine bleeding/spotting/cancer, osteoporosis, risk of blood clots.  Start Verzinio at 100 mg bid. F/U with Jenny Reichmann in 2 weeks Start Zoladex next week Xgeva postponed until we get dental clearance  L4 compression fracture and pain: XRT being planned

## 2021-12-27 NOTE — Telephone Encounter (Signed)
Oral Oncology Patient Advocate Encounter   Received notification that prior authorization for Verzenio is required.   PA submitted on 12/27/2021 Key [id number] Status is pending     Lady Deutscher, CPhT-Adv Pharmacy Patient Advocate Specialist [place] Patient Advocate Team Direct Number: [phone]  Fax: [phone]

## 2021-12-30 ENCOUNTER — Other Ambulatory Visit: Payer: Self-pay | Admitting: General Surgery

## 2021-12-30 ENCOUNTER — Other Ambulatory Visit (HOSPITAL_COMMUNITY): Payer: Self-pay | Admitting: General Surgery

## 2021-12-30 ENCOUNTER — Telehealth: Payer: Self-pay | Admitting: Pharmacy Technician

## 2021-12-30 DIAGNOSIS — Z510 Encounter for antineoplastic radiation therapy: Secondary | ICD-10-CM | POA: Diagnosis not present

## 2021-12-30 DIAGNOSIS — C50911 Malignant neoplasm of unspecified site of right female breast: Secondary | ICD-10-CM

## 2021-12-30 DIAGNOSIS — C50411 Malignant neoplasm of upper-outer quadrant of right female breast: Secondary | ICD-10-CM

## 2021-12-30 DIAGNOSIS — R160 Hepatomegaly, not elsewhere classified: Secondary | ICD-10-CM

## 2021-12-30 MED ORDER — ABEMACICLIB 100 MG PO TABS: 100.0000 mg | ORAL_TABLET | Freq: Two times a day (BID) | ORAL | 0 refills | Status: DC

## 2021-12-30 NOTE — Telephone Encounter (Signed)
Oral Oncology Patient Advocate Encounter   Was successful in obtaining a copay card for Verzenio.  This copay card will make the patients copay $0.   The billing information is as follows and has been shared with [facility].   RxBin: [id number] PCN: [id number] Member ID: [id number] Group ID: [id number]   Lady Deutscher, CPhT-Adv Pharmacy Patient Advocate Specialist [place] Patient Advocate Team Direct Number: [phone]  Fax: [phone]

## 2021-12-31 ENCOUNTER — Encounter (HOSPITAL_COMMUNITY): Admission: RE | Payer: Self-pay | Source: Home / Self Care

## 2021-12-31 ENCOUNTER — Ambulatory Visit (HOSPITAL_COMMUNITY): Admission: RE | Admit: 2021-12-31 | Payer: 59 | Source: Home / Self Care | Admitting: General Surgery

## 2021-12-31 SURGERY — BREAST LUMPECTOMY WITH AXILLARY LYMPH NODE DISSECTION
Anesthesia: General | Site: Breast | Laterality: Right

## 2022-01-01 ENCOUNTER — Telehealth: Payer: Self-pay | Admitting: Adult Health

## 2022-01-01 ENCOUNTER — Encounter: Payer: Self-pay | Admitting: *Deleted

## 2022-01-01 NOTE — Telephone Encounter (Signed)
Called patient to reschedule her 8/21 appointment with Wilber Bihari per PAL. Patient did not want appointment rescheduled due to having current [place] appointments. 8/21 appointment was cancelled per the patient.

## 2022-01-01 NOTE — Telephone Encounter (Signed)
Oral Chemotherapy Pharmacist Encounter  I spoke with patient for overview of: Verzenio for the treatment of advanced, hormone-receptor positive breast cancer, in combination with letrozole and zoladex, planned duration until disease progression or unacceptable toxicity.   Counseled patient on administration, dosing, side effects, monitoring, drug-food interactions, safe handling, storage, and disposal.  Patient will take Verzenio '100mg'$  tablets, 1 tablet by mouth twice daily without regard to food.  Patient knows to avoid grapefruit and grapefruit juice.  Verzenio start date: 01/03/22  Adverse effects include but are not limited to: diarrhea, fatigue, nausea, abdominal pain, decreased blood counts, and increased liver function tests, and joint pains. Severe, life-threatening, and/or fatal interstitial lung disease (ILD) and/or pneumonitis may occur with CDK 4/6 inhibitors.  Patient has anti-emetic on hand and knows to take it if nausea develops.   Patient will obtain anti diarrheal and alert the office of 4 or more loose stools above baseline.  Reviewed with patient importance of keeping a medication schedule and plan for any missed doses. No barriers to medication adherence identified.  Medication reconciliation performed and medication/allergy list updated.  Insurance authorization for [employer] has been obtained. Test claim at the pharmacy revealed copayment $0 due to copay card and fills at [place].   Patient informed the pharmacy will reach out 5-7 days prior to needing next fill of Verzenio to coordinate continued medication acquisition to prevent break in therapy.  All questions answered.  Linda Gutierrez voiced understanding and appreciation.   Medication education handout placed in mail for patient. Patient knows to call the office with questions or concerns. Oral Chemotherapy Clinic phone number provided to patient.   Drema Halon,  PharmD Hematology/Oncology Clinical Pharmacist [facility] [phone] 01/01/2022   11:53 AM

## 2022-01-01 NOTE — Progress Notes (Signed)
Per MD Delton See on hold while we wait for Dental Clearance.  RN placed call to pt who states she is in the process of getting established with a local DDS.  RN emailed pt copy of Dental Clearance form to pt for DDS to complete and fax back. Pt verbalized understanding.

## 2022-01-02 ENCOUNTER — Other Ambulatory Visit: Payer: Self-pay

## 2022-01-02 ENCOUNTER — Encounter: Payer: Self-pay | Admitting: *Deleted

## 2022-01-02 ENCOUNTER — Ambulatory Visit
Admission: RE | Admit: 2022-01-02 | Discharge: 2022-01-02 | Disposition: A | Discharge status: Home / Self Care | Payer: 59 | Source: Ambulatory Visit | Attending: Radiation Oncology | Admitting: Radiation Oncology

## 2022-01-02 DIAGNOSIS — Z510 Encounter for antineoplastic radiation therapy: Secondary | ICD-10-CM | POA: Diagnosis not present

## 2022-01-02 LAB — RAD ONC ARIA SESSION SUMMARY
Course Elapsed Days: 0
Plan Fractions Treated to Date: 1
Plan Prescribed Dose Per Fraction: 3 Gy
Plan Total Fractions Prescribed: 10
Plan Total Prescribed Dose: 30 Gy
Reference Point Dosage Given to Date: 3 Gy
Reference Point Session Dosage Given: 3 Gy
Session Number: 1

## 2022-01-02 NOTE — Progress Notes (Signed)
 Patient Care Team: Collins, Dana, DO as PCP - General (Family Medicine) Gudena, Vinay, MD as Consulting Physician (Hematology and Oncology) Byerly, Faera, MD as Consulting Physician (General Surgery)  DIAGNOSIS: No diagnosis found.  SUMMARY OF ONCOLOGIC HISTORY: Oncology History  CHEK2-related breast cancer (HCC)  11/25/2013 Initial Diagnosis   CHEK2-related breast cancer (HCC)  May 2015 she noted a lump in the right breast at 2 o'clock. MRI Breast (11/21/13): large geographic areas of the right breast malignancy within the 6 to 8 o'clock position right breast lower outer quadrant extending from the chest wall to the retroareolar area of the right breast. Multiple suspicious nodules within the right axilla. 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    04/18/2014 Surgery   Bilateral mastectomies  Right: DCIS but no residual invasive carcinoma. Margins were uninvolved. Sentinel lymph node had a focus of metastatic carcinoma measuring 0.4 cm surgical stage was Tis N1A. Left: benign     Chemotherapy   Received neoadjuvant chemotherapy with Adriamycin and Cytoxan x4.  She then had surgery and then received adjuvant therapy with Taxol weekly x12        - 11/2018 Anti-estrogen oral therapy   Adjuvant antiestrogen therapy with tamoxifen completed June 2020   12/18/2021 Relapse/Recurrence   Palpable lesion in the right breast.  Mammogram and ultrasound at [facility] revealed recurrent nodules in the reconstructed right breast along with enlarged lymph nodes.  Biopsy of the breast and the lymph nodes was performed that revealed breast cancer that was ER/PR 100% positive HER2 negative.  Breast MRI revealed 3 masses in the right breast measuring 2.4, 2.2, 2.5 cm multiple enlarged axillary lymph nodes measuring up to 2.4 cm subpectoral lymph node 1.3 cm, liver 2.8 cm, 2.5 cm and 2.8 cm masses      CHIEF COMPLIANT: Post surgery follow-up.  INTERVAL HISTORY: Linda Gutierrez is a 46 y.o. female is here because of a history of CHEK2-related breast cancer. She presents to the clinic today for a follow-up    ALLERGIES:  has No Known Allergies.  MEDICATIONS:  Current Outpatient Medications  Medication Sig Dispense Refill   abemaciclib (VERZENIO) 100 MG tablet Take 1 tablet (100 mg total) by mouth 2 (two) times daily. 56 tablet 0   albuterol (VENTOLIN HFA) 108 (90 Base) MCG/ACT inhaler Inhale 1-2 puffs into the lungs every 6 (six) hours as needed for wheezing or shortness of breath.     ASHWAGANDHA PO Take 1 capsule by mouth daily.     busPIRone (BUSPAR) 10 MG tablet Take 10 mg by mouth 2 (two) times daily as needed (anxiety).     cholecalciferol (VITAMIN D3) 25 MCG (1000 UNIT) tablet Take 1,000 Units by mouth daily.     letrozole (FEMARA) 2.5 MG tablet Take 1 tablet (2.5 mg total) by mouth daily. 90 tablet 3   loratadine (CLARITIN) 10 MG tablet Take 10 mg by mouth daily.     MAGNESIUM GLYCINATE PO Take 1 capsule by mouth daily.     sertraline (ZOLOFT) 100 MG tablet Take 100 mg by mouth daily.     tamoxifen (NOLVADEX) 20 MG tablet Take 1 tablet (20 mg total) by mouth daily. 90 tablet 0   No current facility-administered medications for this visit.    PHYSICAL EXAMINATION: ECOG PERFORMANCE STATUS: {CHL ONC ECOG PS:[id number]}  There were no vitals filed for this visit. There were no vitals filed for this visit.  BREAST:*** No palpable   masses or nodules in either right or left breasts. No palpable axillary supraclavicular or infraclavicular adenopathy no breast tenderness or nipple discharge. (exam performed in the presence of a chaperone)  LABORATORY DATA:  I have reviewed the data as listed    Latest Ref Rng & Units 12/20/2021   12:46 PM  CMP  Glucose 70 - 99 mg/dL 114   BUN 6 - 20 mg/dL 12   Creatinine 0.44 - 1.00 mg/dL 0.71   Sodium 135 - 145 mmol/L 138   Potassium 3.5  - 5.1 mmol/L 4.2   Chloride 98 - 111 mmol/L 106   CO2 22 - 32 mmol/L 26   Calcium 8.9 - 10.3 mg/dL 9.4   Total Protein 6.5 - 8.1 g/dL 7.8   Total Bilirubin 0.3 - 1.2 mg/dL 0.7   Alkaline Phos 38 - 126 U/L 103   AST 15 - 41 U/L 41   ALT 0 - 44 U/L 48     Lab Results  Component Value Date   WBC 8.9 12/20/2021   HGB 15.2 (H) 12/20/2021   HCT 42.6 12/20/2021   MCV 91.6 12/20/2021   PLT 248 12/20/2021   NEUTROABS 5.6 12/20/2021    ASSESSMENT & PLAN:  No problem-specific Assessment & Plan notes found for this encounter.    No orders of the defined types were placed in this encounter.  The patient has a good understanding of the overall plan. she agrees with it. she will call with any problems that may develop before the next visit here. Total time spent: 30 mins including face to face time and time spent for planning, charting and co-ordination of care   Deritra L Mcnairy, CMA 01/02/22    I Deritra, Mcnairy am scribing for Dr. Gudena  ***  

## 2022-01-02 NOTE — Progress Notes (Signed)
Received message from [facility] [place] team stating Zometa is preferred over Xgeva.  Per MD pt needing to receive Delton See but is currently on hold waiting for dental clearance. RN placed call to pt requesting Dentist information to fax clearance form.  Pt states she is currently in the process of finding a new local dentist and will alert our office once seen.  PA team states the insurance company will not allow PA process for Xgeva until dental clearance is received. Once this office receives dental clearance, PA teal will be notified to start [place] PA process.

## 2022-01-03 ENCOUNTER — Inpatient Hospital Stay: Payer: 59

## 2022-01-03 ENCOUNTER — Other Ambulatory Visit: Payer: Self-pay

## 2022-01-03 ENCOUNTER — Ambulatory Visit
Admission: RE | Admit: 2022-01-03 | Discharge: 2022-01-03 | Disposition: A | Discharge status: Home / Self Care | Payer: 59 | Source: Ambulatory Visit | Attending: Radiation Oncology | Admitting: Radiation Oncology

## 2022-01-03 VITALS — BP 126/92 | HR 72 | Temp 98.5°F | Resp 16

## 2022-01-03 DIAGNOSIS — C50911 Malignant neoplasm of unspecified site of right female breast: Secondary | ICD-10-CM

## 2022-01-03 DIAGNOSIS — Z510 Encounter for antineoplastic radiation therapy: Secondary | ICD-10-CM | POA: Diagnosis not present

## 2022-01-03 LAB — RAD ONC ARIA SESSION SUMMARY
Course Elapsed Days: 1
Plan Fractions Treated to Date: 2
Plan Prescribed Dose Per Fraction: 3 Gy
Plan Total Fractions Prescribed: 10
Plan Total Prescribed Dose: 30 Gy
Reference Point Dosage Given to Date: 6 Gy
Reference Point Session Dosage Given: 3 Gy
Session Number: 2

## 2022-01-03 MED ORDER — GOSERELIN ACETATE 3.6 MG ~~LOC~~ IMPL
3.6000 mg | DRUG_IMPLANT | Freq: Once | SUBCUTANEOUS | Status: AC
  Administered 2022-01-03: 3.6 mg via SUBCUTANEOUS
  Filled 2022-01-03: qty 3.6

## 2022-01-06 ENCOUNTER — Ambulatory Visit
Admission: RE | Admit: 2022-01-06 | Discharge: 2022-01-06 | Disposition: A | Discharge status: Home / Self Care | Payer: 59 | Source: Ambulatory Visit | Attending: Radiation Oncology | Admitting: Radiation Oncology

## 2022-01-06 ENCOUNTER — Inpatient Hospital Stay (HOSPITAL_BASED_OUTPATIENT_CLINIC_OR_DEPARTMENT_OTHER): Payer: 59 | Admitting: Hematology and Oncology

## 2022-01-06 ENCOUNTER — Other Ambulatory Visit: Payer: Self-pay

## 2022-01-06 VITALS — BP 116/74 | HR 74 | Temp 97.3°F | Resp 18 | Ht 68.0 in | Wt 239.7 lb

## 2022-01-06 DIAGNOSIS — Z510 Encounter for antineoplastic radiation therapy: Secondary | ICD-10-CM | POA: Diagnosis not present

## 2022-01-06 DIAGNOSIS — Z1589 Genetic susceptibility to other disease: Secondary | ICD-10-CM

## 2022-01-06 DIAGNOSIS — Z1502 Genetic susceptibility to malignant neoplasm of ovary: Secondary | ICD-10-CM

## 2022-01-06 DIAGNOSIS — C50919 Malignant neoplasm of unspecified site of unspecified female breast: Secondary | ICD-10-CM | POA: Diagnosis not present

## 2022-01-06 DIAGNOSIS — C7951 Secondary malignant neoplasm of bone: Secondary | ICD-10-CM

## 2022-01-06 DIAGNOSIS — Z1509 Genetic susceptibility to other malignant neoplasm: Secondary | ICD-10-CM

## 2022-01-06 LAB — RAD ONC ARIA SESSION SUMMARY
Course Elapsed Days: 4
Plan Fractions Treated to Date: 3
Plan Prescribed Dose Per Fraction: 3 Gy
Plan Total Fractions Prescribed: 10
Plan Total Prescribed Dose: 30 Gy
Reference Point Dosage Given to Date: 9 Gy
Reference Point Session Dosage Given: 3 Gy
Session Number: 3

## 2022-01-06 MED ORDER — ONDANSETRON HCL 8 MG PO TABS: 8.0000 mg | ORAL_TABLET | Freq: Three times a day (TID) | ORAL | 5 refills | Status: DC | PRN

## 2022-01-06 NOTE — Assessment & Plan Note (Addendum)
May 2015: Right breast lump, mammogram ultrasound and MRI showed multiple suspicious nodules within the right axilla.  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, lymph node biopsy: Positive, ER 100%, PR 100%, Ki-67 15%, HER2 equivocal 2+, FISH: 12/26/2021: PET/CT: Small hypermetabolic right chest wall nodules and right axillary lymphadenopathy consistent with recurrent breast cancer, numerous large hepatic metastatic lesions, small scattered pulmonary nodules, diffuse lytic/destructive osseous metastatic lesions especially at L4  Given the new diagnosis of metastatic breast cancer, Recommend treatment with Zoladex with letrozole and Verzinio started 01/03/2022  Abemaciclib Toxicities: 1.  Diarrhea: Once a day: Took Imodium 2. nausea: Sent a prescription for Zofran.  Started Verzinio at 100 mg bid.  If she is tolerating it well we will plan to increase it to 150 twice daily.  Started Zoladex 01/03/2022 Xgeva postponed until we get dental clearance  L4 compression fracture and pain: XRT started 01/03/2022-01/15/2022  We will refer her to orthopedics  We will need to cancel the CT and a bone scan appointment.  She had a PET CT scan instead.

## 2022-01-07 ENCOUNTER — Other Ambulatory Visit: Payer: Self-pay

## 2022-01-07 ENCOUNTER — Ambulatory Visit
Admission: RE | Admit: 2022-01-07 | Discharge: 2022-01-07 | Disposition: A | Discharge status: Home / Self Care | Payer: 59 | Source: Ambulatory Visit | Attending: Radiation Oncology | Admitting: Radiation Oncology

## 2022-01-07 DIAGNOSIS — C7951 Secondary malignant neoplasm of bone: Secondary | ICD-10-CM | POA: Insufficient documentation

## 2022-01-07 DIAGNOSIS — A630 Anogenital (venereal) warts: Secondary | ICD-10-CM | POA: Diagnosis not present

## 2022-01-07 DIAGNOSIS — C787 Secondary malignant neoplasm of liver and intrahepatic bile duct: Secondary | ICD-10-CM | POA: Insufficient documentation

## 2022-01-07 DIAGNOSIS — Z9221 Personal history of antineoplastic chemotherapy: Secondary | ICD-10-CM | POA: Diagnosis not present

## 2022-01-07 DIAGNOSIS — J45909 Unspecified asthma, uncomplicated: Secondary | ICD-10-CM | POA: Diagnosis not present

## 2022-01-07 DIAGNOSIS — Z79899 Other long term (current) drug therapy: Secondary | ICD-10-CM | POA: Diagnosis not present

## 2022-01-07 DIAGNOSIS — C778 Secondary and unspecified malignant neoplasm of lymph nodes of multiple regions: Secondary | ICD-10-CM | POA: Insufficient documentation

## 2022-01-07 DIAGNOSIS — Z170 Estrogen receptor positive status [ER+]: Secondary | ICD-10-CM | POA: Insufficient documentation

## 2022-01-07 DIAGNOSIS — Z79818 Long term (current) use of other agents affecting estrogen receptors and estrogen levels: Secondary | ICD-10-CM | POA: Insufficient documentation

## 2022-01-07 DIAGNOSIS — C50811 Malignant neoplasm of overlapping sites of right female breast: Secondary | ICD-10-CM | POA: Insufficient documentation

## 2022-01-07 DIAGNOSIS — Z1501 Genetic susceptibility to malignant neoplasm of breast: Secondary | ICD-10-CM | POA: Diagnosis not present

## 2022-01-07 DIAGNOSIS — Z79811 Long term (current) use of aromatase inhibitors: Secondary | ICD-10-CM | POA: Diagnosis not present

## 2022-01-07 DIAGNOSIS — Z510 Encounter for antineoplastic radiation therapy: Secondary | ICD-10-CM | POA: Insufficient documentation

## 2022-01-07 DIAGNOSIS — Z9013 Acquired absence of bilateral breasts and nipples: Secondary | ICD-10-CM | POA: Diagnosis not present

## 2022-01-07 DIAGNOSIS — Z148 Genetic carrier of other disease: Secondary | ICD-10-CM | POA: Diagnosis not present

## 2022-01-07 DIAGNOSIS — Z87410 Personal history of cervical dysplasia: Secondary | ICD-10-CM | POA: Diagnosis not present

## 2022-01-07 DIAGNOSIS — F32A Depression, unspecified: Secondary | ICD-10-CM | POA: Diagnosis not present

## 2022-01-07 DIAGNOSIS — Z5111 Encounter for antineoplastic chemotherapy: Secondary | ICD-10-CM | POA: Diagnosis not present

## 2022-01-07 DIAGNOSIS — Z90710 Acquired absence of both cervix and uterus: Secondary | ICD-10-CM | POA: Diagnosis not present

## 2022-01-07 DIAGNOSIS — R32 Unspecified urinary incontinence: Secondary | ICD-10-CM | POA: Diagnosis not present

## 2022-01-07 LAB — RAD ONC ARIA SESSION SUMMARY
Course Elapsed Days: 5
Plan Fractions Treated to Date: 4
Plan Prescribed Dose Per Fraction: 3 Gy
Plan Total Fractions Prescribed: 10
Plan Total Prescribed Dose: 30 Gy
Reference Point Dosage Given to Date: 12 Gy
Reference Point Session Dosage Given: 3 Gy
Session Number: 4

## 2022-01-08 ENCOUNTER — Other Ambulatory Visit: Payer: Self-pay

## 2022-01-08 ENCOUNTER — Ambulatory Visit
Admission: RE | Admit: 2022-01-08 | Discharge: 2022-01-08 | Disposition: A | Discharge status: Home / Self Care | Payer: 59 | Source: Ambulatory Visit | Attending: Radiation Oncology | Admitting: Radiation Oncology

## 2022-01-08 DIAGNOSIS — Z5111 Encounter for antineoplastic chemotherapy: Secondary | ICD-10-CM | POA: Diagnosis not present

## 2022-01-08 LAB — RAD ONC ARIA SESSION SUMMARY
Course Elapsed Days: 6
Plan Fractions Treated to Date: 5
Plan Prescribed Dose Per Fraction: 3 Gy
Plan Total Fractions Prescribed: 10
Plan Total Prescribed Dose: 30 Gy
Reference Point Dosage Given to Date: 15 Gy
Reference Point Session Dosage Given: 3 Gy
Session Number: 5

## 2022-01-09 ENCOUNTER — Telehealth: Payer: Self-pay | Admitting: Radiation Oncology

## 2022-01-09 ENCOUNTER — Ambulatory Visit: Payer: 59

## 2022-01-09 NOTE — Telephone Encounter (Addendum)
Patient called stating she will not be able to come in for [facility] 8/3 due to not feeling well. L4 notified.

## 2022-01-10 ENCOUNTER — Ambulatory Visit
Admission: RE | Admit: 2022-01-10 | Discharge: 2022-01-10 | Disposition: A | Discharge status: Home / Self Care | Payer: 59 | Source: Ambulatory Visit | Attending: Radiation Oncology | Admitting: Radiation Oncology

## 2022-01-10 ENCOUNTER — Other Ambulatory Visit: Payer: Self-pay

## 2022-01-10 DIAGNOSIS — Z5111 Encounter for antineoplastic chemotherapy: Secondary | ICD-10-CM | POA: Diagnosis not present

## 2022-01-10 LAB — RAD ONC ARIA SESSION SUMMARY
Course Elapsed Days: 8
Plan Fractions Treated to Date: 6
Plan Prescribed Dose Per Fraction: 3 Gy
Plan Total Fractions Prescribed: 10
Plan Total Prescribed Dose: 30 Gy
Reference Point Dosage Given to Date: 18 Gy
Reference Point Session Dosage Given: 3 Gy
Session Number: 6

## 2022-01-13 ENCOUNTER — Encounter: Payer: Self-pay | Admitting: Specialist

## 2022-01-13 ENCOUNTER — Other Ambulatory Visit (HOSPITAL_COMMUNITY): Payer: 59

## 2022-01-13 ENCOUNTER — Other Ambulatory Visit: Payer: Self-pay

## 2022-01-13 ENCOUNTER — Ambulatory Visit
Admission: RE | Admit: 2022-01-13 | Discharge: 2022-01-13 | Disposition: A | Discharge status: Home / Self Care | Payer: 59 | Source: Ambulatory Visit | Attending: Radiation Oncology | Admitting: Radiation Oncology

## 2022-01-13 ENCOUNTER — Encounter (HOSPITAL_COMMUNITY): Payer: Self-pay

## 2022-01-13 ENCOUNTER — Ambulatory Visit (INDEPENDENT_AMBULATORY_CARE_PROVIDER_SITE_OTHER): Payer: 59 | Admitting: Specialist

## 2022-01-13 VITALS — BP 122/79 | HR 60 | Ht 68.0 in | Wt 240.0 lb

## 2022-01-13 DIAGNOSIS — C7951 Secondary malignant neoplasm of bone: Secondary | ICD-10-CM | POA: Diagnosis not present

## 2022-01-13 DIAGNOSIS — M8448XA Pathological fracture, other site, initial encounter for fracture: Secondary | ICD-10-CM

## 2022-01-13 DIAGNOSIS — R2241 Localized swelling, mass and lump, right lower limb: Secondary | ICD-10-CM

## 2022-01-13 DIAGNOSIS — Z5111 Encounter for antineoplastic chemotherapy: Secondary | ICD-10-CM | POA: Diagnosis not present

## 2022-01-13 LAB — RAD ONC ARIA SESSION SUMMARY
Course Elapsed Days: 11
Plan Fractions Treated to Date: 7
Plan Prescribed Dose Per Fraction: 3 Gy
Plan Total Fractions Prescribed: 10
Plan Total Prescribed Dose: 30 Gy
Reference Point Dosage Given to Date: 21 Gy
Reference Point Session Dosage Given: 3 Gy
Session Number: 7

## 2022-01-13 MED ORDER — TRAMADOL HCL 50 MG PO TABS: 50.0000 mg | ORAL_TABLET | Freq: Four times a day (QID) | ORAL | 0 refills | Status: DC | PRN

## 2022-01-13 MED ORDER — ALENDRONATE SODIUM 70 MG PO TABS: 70.0000 mg | ORAL_TABLET | ORAL | 1 refills | Status: DC

## 2022-01-13 NOTE — Patient Instructions (Signed)
Use a walker partial weight bearing right leg 50% Tramadol for discomfort Start Fosamax  one tablet daily one hour prior to breakfast on an empty stomach with a large glass of water. CT scan of the neck, thorax and lumbar spine, CT scan of the pelvis and right hip Return in 2 weeks for follow up.

## 2022-01-13 NOTE — Progress Notes (Signed)
Office Visit Note   Patient: Linda Gutierrez           Date of Birth: 1975/07/29           MRN: [id number] Visit Date: 01/13/2022              Requested by: Nicholas Lose, MD [address] [place],  [place] [address] PCP: Janie Morning, DO   Assessment & Plan: Visit Diagnoses:  1. Pathologic lumbar vertebral fracture, initial encounter   2. Cancer, metastatic to bone ([place])   3. Mass of right hip region     Plan:Use a walker partial weight bearing right leg 50% Tramadol for discomfort Start Fosamax  one tablet daily one hour prior to breakfast on an empty stomach with a large glass of water. CT scan of the neck, thorax and lumbar spine, CT scan of the pelvis and right hip Return in 2 weeks for follow up.   Follow-Up Instructions: No follow-ups on file.   Orders:  Orders Placed This Encounter  Procedures   CT LUMBAR SPINE WO CONTRAST   CT THORACIC SPINE WO CONTRAST   CT CERVICAL SPINE WO CONTRAST   CT ABD PELVIS WO CONTRAST W/NEXTRAST ([facility])   CT HIP RIGHT WO CONTRAST   Meds ordered this encounter  Medications   traMADol (ULTRAM) 50 MG tablet    Sig: Take 1 tablet (50 mg total) by mouth every 6 (six) hours as needed.    Dispense:  30 tablet    Refill:  0   alendronate (FOSAMAX) 70 MG tablet    Sig: Take 1 tablet (70 mg total) by mouth once a week. Take with a full glass of water on an empty stomach.    Dispense:  30 tablet    Refill:  1      Procedures: No procedures performed   Clinical Data: No additional findings.   Subjective: Chief Complaint  Patient presents with   Lower Back - Pain    46 year old female with history of breast ca recurrence with metastatic disease to the spine L4 and right hip. She has had PET scan positive in these. There are also foci in the liver and not sure of lung. No bowel or bladder difficulty, no numbness or tingling.  No night pain. She occasionally has right hip pain and difficulty with walking.  No history  of previous back pain. Bilateral mastectomies [id number]. Breast ca is estrogen positive. No family history of Breast ca. Has L4 fracture. In the PET scan was diagnosed with possible fracture.     Review of Systems  Constitutional:  Positive for appetite change, chills and fatigue. Negative for activity change, diaphoresis, fever and unexpected weight change.  HENT:  Positive for sinus pressure. Negative for congestion, dental problem, drooling, ear discharge, ear pain, facial swelling, hearing loss, mouth sores, sore throat, trouble swallowing and voice change.   Eyes: Negative.  Negative for photophobia, pain, discharge, redness, itching and visual disturbance.  Respiratory:  Positive for cough. Negative for shortness of breath, wheezing and stridor.   Cardiovascular:  Negative for chest pain, palpitations and leg swelling.  Gastrointestinal:  Positive for diarrhea and nausea. Negative for abdominal distention, abdominal pain, anal bleeding, blood in stool, constipation, rectal pain and vomiting.  Endocrine: Negative for cold intolerance, heat intolerance, polydipsia, polyphagia and polyuria.  Genitourinary: Negative.  Negative for decreased urine volume, dyspareunia, dysuria, enuresis, flank pain, frequency, genital sores, hematuria, menstrual problem, pelvic pain, urgency, vaginal bleeding, vaginal discharge and  vaginal pain.  Musculoskeletal:  Negative for arthralgias, back pain, gait problem, joint swelling, myalgias, neck pain and neck stiffness.  Skin: Negative.  Negative for color change, pallor, rash and wound.  Allergic/Immunologic: Negative for environmental allergies, food allergies and immunocompromised state.  Neurological:  Positive for headaches. Negative for dizziness, tremors, seizures, syncope, facial asymmetry, speech difficulty, weakness, light-headedness and numbness.  Hematological:  Negative for adenopathy. Does not bruise/bleed easily.  Psychiatric/Behavioral:  Negative for  agitation, behavioral problems, confusion, decreased concentration, dysphoric mood, hallucinations, self-injury, sleep disturbance and suicidal ideas. The patient is not nervous/anxious and is not hyperactive.      Objective: Vital Signs: BP 122/79 (BP Location: Left Arm, Patient Position: Sitting)   Pulse 60   Ht 5' 8"  (1.727 m)   Wt 240 lb (108.9 kg)   BMI 36.49 kg/m   Physical Exam Constitutional:      Appearance: She is well-developed.  HENT:     Head: Normocephalic and atraumatic.  Eyes:     Pupils: Pupils are equal, round, and reactive to light.  Pulmonary:     Effort: Pulmonary effort is normal.     Breath sounds: Normal breath sounds.  Abdominal:     General: Bowel sounds are normal.     Palpations: Abdomen is soft.  Musculoskeletal:        General: Normal range of motion.     Cervical back: Normal range of motion and neck supple.     Lumbar back: Negative right straight leg raise test and negative left straight leg raise test.  Skin:    General: Skin is warm and dry.  Neurological:     Mental Status: She is alert and oriented to person, place, and time.  Psychiatric:        Behavior: Behavior normal.        Thought Content: Thought content normal.        Judgment: Judgment normal.     Back Exam   Tenderness  The patient is experiencing no tenderness.   Range of Motion  Extension:  normal  Flexion:  normal  Lateral bend right:  normal  Lateral bend left:  normal  Rotation right:  normal  Rotation left:  normal   Muscle Strength  Right Quadriceps:  5/5  Left Quadriceps:  5/5  Right Hamstrings:  5/5  Left Hamstrings:  5/5   Tests  Straight leg raise right: negative Straight leg raise left: negative  Reflexes  Patellar:  2/4 Achilles:  2/4     Specialty Comments:  No specialty comments available.  Imaging: No results found.   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   Past Medical History:  Diagnosis Date   Abnormal Pap smear of cervix 2016   HPV   Asthma    Breast cancer ([place]) 2015   rt side   Depression    History of hiatal hernia    Pneumonia    STD (sexually transmitted disease)    HPV   Urinary incontinence     Family History  Problem Relation Age of Onset  Thyroid disease Mother    Uterine cancer Mother    Diabetes Father    Hypertension Father    Lupus Father    Diabetes Maternal Grandmother    Heart disease Maternal Grandmother     Past Surgical History:  Procedure Laterality Date   BREAST RECONSTRUCTION     BREAST RECONSTRUCTION     with implants   CERVICAL BIOPSY  W/ LOOP ELECTRODE EXCISION     COLPOSCOPY     MASTECTOMY Bilateral 2017   TOTAL VAGINAL HYSTERECTOMY     Social History   Occupational History   Not on file  Tobacco Use   Smoking status: Former    Packs/day: 0.50    Years: 10.00    Total pack years: 5.00    Types: Cigarettes    Quit date: 2015    Years since quitting: 8.6   Smokeless tobacco: Never  Vaping Use   Vaping Use: Never used  Substance and Sexual Activity   Alcohol use: Yes    Alcohol/week: 6.0 standard drinks of alcohol    Types: 6 Standard drinks or equivalent per week   Drug use: Never   Sexual activity: Yes    Partners: Male    Birth control/protection: Surgical    Comment: hysterectomy 2017

## 2022-01-13 NOTE — Addendum Note (Signed)
Addended by: Basil Dess on: 01/13/2022 03:57 PM   Modules accepted: Level of Service

## 2022-01-13 NOTE — Addendum Note (Signed)
Encounter addended by: Zola Button, RN on: 01/13/2022 2:50 PM  Actions taken: Delete clinical note

## 2022-01-14 ENCOUNTER — Ambulatory Visit
Admission: RE | Admit: 2022-01-14 | Discharge: 2022-01-14 | Disposition: A | Discharge status: Home / Self Care | Payer: 59 | Source: Ambulatory Visit | Attending: Radiation Oncology | Admitting: Radiation Oncology

## 2022-01-14 ENCOUNTER — Other Ambulatory Visit: Payer: Self-pay

## 2022-01-14 ENCOUNTER — Inpatient Hospital Stay: Payer: 59 | Attending: Adult Health

## 2022-01-14 ENCOUNTER — Other Ambulatory Visit: Payer: 59

## 2022-01-14 ENCOUNTER — Inpatient Hospital Stay: Payer: 59 | Admitting: Pharmacist

## 2022-01-14 VITALS — BP 129/90 | HR 72 | Temp 97.7°F | Resp 16 | Ht 68.0 in | Wt 238.4 lb

## 2022-01-14 DIAGNOSIS — Z5111 Encounter for antineoplastic chemotherapy: Secondary | ICD-10-CM | POA: Insufficient documentation

## 2022-01-14 DIAGNOSIS — C787 Secondary malignant neoplasm of liver and intrahepatic bile duct: Secondary | ICD-10-CM | POA: Insufficient documentation

## 2022-01-14 DIAGNOSIS — C7951 Secondary malignant neoplasm of bone: Secondary | ICD-10-CM | POA: Insufficient documentation

## 2022-01-14 DIAGNOSIS — Z79899 Other long term (current) drug therapy: Secondary | ICD-10-CM | POA: Insufficient documentation

## 2022-01-14 DIAGNOSIS — R32 Unspecified urinary incontinence: Secondary | ICD-10-CM | POA: Insufficient documentation

## 2022-01-14 DIAGNOSIS — J45909 Unspecified asthma, uncomplicated: Secondary | ICD-10-CM | POA: Insufficient documentation

## 2022-01-14 DIAGNOSIS — Z1501 Genetic susceptibility to malignant neoplasm of breast: Secondary | ICD-10-CM | POA: Insufficient documentation

## 2022-01-14 DIAGNOSIS — Z9221 Personal history of antineoplastic chemotherapy: Secondary | ICD-10-CM | POA: Insufficient documentation

## 2022-01-14 DIAGNOSIS — Z9013 Acquired absence of bilateral breasts and nipples: Secondary | ICD-10-CM | POA: Insufficient documentation

## 2022-01-14 DIAGNOSIS — Z510 Encounter for antineoplastic radiation therapy: Secondary | ICD-10-CM | POA: Insufficient documentation

## 2022-01-14 DIAGNOSIS — Z79818 Long term (current) use of other agents affecting estrogen receptors and estrogen levels: Secondary | ICD-10-CM | POA: Insufficient documentation

## 2022-01-14 DIAGNOSIS — A630 Anogenital (venereal) warts: Secondary | ICD-10-CM | POA: Insufficient documentation

## 2022-01-14 DIAGNOSIS — Z79811 Long term (current) use of aromatase inhibitors: Secondary | ICD-10-CM | POA: Insufficient documentation

## 2022-01-14 DIAGNOSIS — Z90710 Acquired absence of both cervix and uterus: Secondary | ICD-10-CM | POA: Insufficient documentation

## 2022-01-14 DIAGNOSIS — C50811 Malignant neoplasm of overlapping sites of right female breast: Secondary | ICD-10-CM | POA: Insufficient documentation

## 2022-01-14 DIAGNOSIS — Z148 Genetic carrier of other disease: Secondary | ICD-10-CM | POA: Insufficient documentation

## 2022-01-14 DIAGNOSIS — F32A Depression, unspecified: Secondary | ICD-10-CM | POA: Insufficient documentation

## 2022-01-14 DIAGNOSIS — Z87410 Personal history of cervical dysplasia: Secondary | ICD-10-CM | POA: Insufficient documentation

## 2022-01-14 DIAGNOSIS — C50911 Malignant neoplasm of unspecified site of right female breast: Secondary | ICD-10-CM | POA: Insufficient documentation

## 2022-01-14 DIAGNOSIS — C778 Secondary and unspecified malignant neoplasm of lymph nodes of multiple regions: Secondary | ICD-10-CM | POA: Insufficient documentation

## 2022-01-14 DIAGNOSIS — Z170 Estrogen receptor positive status [ER+]: Secondary | ICD-10-CM | POA: Insufficient documentation

## 2022-01-14 LAB — RAD ONC ARIA SESSION SUMMARY
Course Elapsed Days: 12
Plan Fractions Treated to Date: 8
Plan Prescribed Dose Per Fraction: 3 Gy
Plan Total Fractions Prescribed: 10
Plan Total Prescribed Dose: 30 Gy
Reference Point Dosage Given to Date: 24 Gy
Reference Point Session Dosage Given: 3 Gy
Session Number: 8

## 2022-01-14 LAB — CBC WITH DIFFERENTIAL (CANCER CENTER ONLY)
Abs Immature Granulocytes: 0.01 10*3/uL (ref 0.00–0.07)
Basophils Absolute: 0 10*3/uL (ref 0.0–0.1)
Basophils Relative: 1 %
Eosinophils Absolute: 0.1 10*3/uL (ref 0.0–0.5)
Eosinophils Relative: 3 %
HCT: 40 % (ref 36.0–46.0)
Hemoglobin: 14.1 g/dL (ref 12.0–15.0)
Immature Granulocytes: 0 %
Lymphocytes Relative: 21 %
Lymphs Abs: 0.8 10*3/uL (ref 0.7–4.0)
MCH: 32.5 pg (ref 26.0–34.0)
MCHC: 35.3 g/dL (ref 30.0–36.0)
MCV: 92.2 fL (ref 80.0–100.0)
Monocytes Absolute: 0.2 10*3/uL (ref 0.1–1.0)
Monocytes Relative: 6 %
Neutro Abs: 2.8 10*3/uL (ref 1.7–7.7)
Neutrophils Relative %: 69 %
Platelet Count: 167 10*3/uL (ref 150–400)
RBC: 4.34 MIL/uL (ref 3.87–5.11)
RDW: 11.6 % (ref 11.5–15.5)
WBC Count: 4 10*3/uL (ref 4.0–10.5)
nRBC: 0 % (ref 0.0–0.2)

## 2022-01-14 LAB — CMP (CANCER CENTER ONLY)
ALT: 17 U/L (ref 0–44)
AST: 19 U/L (ref 15–41)
Albumin: 4 g/dL (ref 3.5–5.0)
Alkaline Phosphatase: 64 U/L (ref 38–126)
Anion gap: 3 — ABNORMAL LOW (ref 5–15)
BUN: 11 mg/dL (ref 6–20)
CO2: 29 mmol/L (ref 22–32)
Calcium: 8.9 mg/dL (ref 8.9–10.3)
Chloride: 106 mmol/L (ref 98–111)
Creatinine: 0.93 mg/dL (ref 0.44–1.00)
GFR, Estimated: >60 mL/min (ref >60)
Glucose, Bld: 96 mg/dL (ref 70–99)
Potassium: 4.7 mmol/L (ref 3.5–5.1)
Sodium: 138 mmol/L (ref 135–145)
Total Bilirubin: 0.8 mg/dL (ref 0.3–1.2)
Total Protein: 7.7 g/dL (ref 6.5–8.1)

## 2022-01-14 MED ORDER — PROCHLORPERAZINE MALEATE 10 MG PO TABS: 10.0000 mg | ORAL_TABLET | Freq: Four times a day (QID) | ORAL | 3 refills | Status: DC | PRN

## 2022-01-14 NOTE — Progress Notes (Signed)
[place]       Telephone: [phone]?Fax: [phone]   Oncology Clinical Pharmacist Practitioner Initial Assessment  Linda Gutierrez is a 46 y.o. female with a diagnosis of breast cancer. They were contacted today via in person visit.  Indication/Regimen Abemaciclib (Verzenio) is being used appropriately for treatment of metastatic breast cancer by Dr. Nicholas Lose.      Wt Readings from Last 1 Encounters:  01/14/22 238 lb 6.4 oz (108.1 kg)    Estimated body surface area is 2.28 meters squared as calculated from the following:   Height as of this encounter: '5\' 8"'$  (1.727 m).   Weight as of this encounter: 238 lb 6.4 oz (108.1 kg).  The dosing regimen is 100 mg by mouth every 12 hours on days 1 to 28 of a 28-day cycle. This is being given in combination with letrozole and every 4-week goserelin.  She will also start every 12-week denosumab 120 mg when she has dental appointments. It is planned to continue until disease progression or unacceptable toxicity.    Linda Gutierrez was seen today by clinical pharmacy to establish care for her abemaciclib management.  She is being referred by Dr. Lindi Adie.  She started her abemaciclib on 01/03/22.  She also receives letrozole 2.5 mg by mouth daily which she started on 12/27/21 and goserelin 3.6 mg subcutaneously every 28 days.  She does plan on starting denosumab 120 mg subcutaneously every 12 weeks when she has dental clearance.  We have advised her today to try to obtain dental clearance as soon as possible.  She last saw Dr. Lindi Adie on 01/06/22 and she last had restaging scans on 12/25/21.  Today we went over possible side effects of abemaciclib which include but are not limited to diarrhea, neutropenia, pneumonitis, liver toxicity, blood clots, fatigue, nausea, and vomiting.  Confirmed with patient today that she has had a hysterectomy.  We also went over storage and handling of the medication and what to do should she miss a dose.   She is currently on 100 mg every 12 hours and receiving this medication from [place].  She reports today having nausea but no vomiting.  She has been taking ondansetron every 12 hours and we did discuss today that she could take it every 8 hours if needed.  We also prescribed prochlorperazine as needed which has been sent to her pharmacy of choice.  She also likes to drink lemon water which she feels works well for her nausea. She also reports some bloating and gas which we discussed taking simethicone over-the-counter.  She also reports loose stools which she initially took 2 loperamide for and became constipated.  She is now taking 1 tablet of loperamide when she has a loose stool and so far she reports the diarrhea being manageable.  Her labs today do not reflect any electrolyte abnormalities or signs of dehydration.  We discussed today that manufacturing guidelines recommend labs every 2 weeks for 2 months followed by monthly labs for 2 months and then as clinically indicated.  We did discuss potentially going up on her dose of abemaciclib at her next visit.   Dose Modifications Per Dr. Lindi Adie, patient is currently on abemaciclib at a reduced dose of 100 mg every 12 hours.  We have discussed potentially going up on this dose at her next appointment in 2 weeks.  Access Assessment Linda Gutierrez will be receiving abemaciclib through [facility] Insurance Concerns: None Start date if known:  Abemaciclib (01/03/22) Letrozole (12/27/21) Goserelin (01/03/22)  Allergies No Known Allergies  Vitals    01/14/2022    2:06 PM 01/13/2022    1:39 PM 01/06/2022    1:53 PM  Vitals with BMI  Height '5\' 8"'$  '5\' 8"'$  '5\' 8"'$   Weight 238 lbs 6 oz 240 lbs 239 lbs 11 oz  BMI 36.26 48.5 46.27  Systolic 035 009 381  Diastolic 90 79 74  Pulse 72 60 74     Laboratory Data    Latest Ref Rng & Units 01/14/2022    1:43 PM 12/20/2021   12:46 PM  CBC EXTENDED  WBC 4.0 - 10.5 K/uL 4.0  8.9    RBC 3.87 - 5.11 MIL/uL 4.34  4.65   Hemoglobin 12.0 - 15.0 g/dL 14.1  15.2   HCT 36.0 - 46.0 % 40.0  42.6   Platelets 150 - 400 K/uL 167  248   NEUT# 1.7 - 7.7 K/uL 2.8  5.6   Lymph# 0.7 - 4.0 K/uL 0.8  2.4        Latest Ref Rng & Units 01/14/2022    1:43 PM 12/20/2021   12:46 PM  CMP  Glucose 70 - 99 mg/dL 96  114   BUN 6 - 20 mg/dL 11  12   Creatinine 0.44 - 1.00 mg/dL 0.93  0.71   Sodium 135 - 145 mmol/L 138  138   Potassium 3.5 - 5.1 mmol/L 4.7  4.2   Chloride 98 - 111 mmol/L 106  106   CO2 22 - 32 mmol/L 29  26   Calcium 8.9 - 10.3 mg/dL 8.9  9.4   Total Protein 6.5 - 8.1 g/dL 7.7  7.8   Total Bilirubin 0.3 - 1.2 mg/dL 0.8  0.7   Alkaline Phos 38 - 126 U/L 64  103   AST 15 - 41 U/L 19  41   ALT 0 - 44 U/L 17  48    No results found for: "MG"   Contraindications Contraindications were reviewed?  Yes Contraindications to therapy were identified?  No  Safety Precautions The following safety precautions for the use of abemaciclib were reviewed:  Diarrhea: we reviewed that diarrhea is common with abemaciclib and confirmed that she does have loperamide (Imodium) at home.  We reviewed how to take this medication PRN and gave her information on abemaciclib Neutropenia: we discussed the importance of having a thermometer and what the Centers for Disease Control and Prevention (CDC) considers a fever which is 100.39F (38C) or higher.  Gave patient 24/7 triage line to call if any fevers or symptoms ILD/Pneumonitis: we reviewed potential symptoms including cough, shortness, and fatigue. Hepatotoxicity: reviewed to contact clinic for RUQ pain that will not subside, yellowing of eyes/skin Venous thromboembolism (VTE): reviewed signs of deep vein thrombosis (DVT) such as leg swelling, redness, pain, or tenderness and signs of pulmonary embolism (PE) such as shortness of breath, rapid or irregular heartbeat, cough, chest pain, or lightheadedness Reviewed to take the medication every 12  hours (with food sometimes can be easier on the stomach) and to take it at the same time every day. Discussed proper storage and handling of abemaciclib Nausea/vomiting: She does have ondansetron at home and we did send a prescription for prochlorperazine as needed today to her local pharmacy of choice  Medication Reconciliation Current Outpatient Medications  Medication Sig Dispense Refill   abemaciclib (VERZENIO) 100 MG tablet Take 1 tablet (100 mg total) by mouth 2 (two) times daily. 56 tablet  0   albuterol (VENTOLIN HFA) 108 (90 Base) MCG/ACT inhaler Inhale 1-2 puffs into the lungs every 6 (six) hours as needed for wheezing or shortness of breath.     calcium carbonate (OS-CAL - DOSED IN MG OF ELEMENTAL CALCIUM) 1250 (500 Ca) MG tablet Take 1 tablet by mouth at bedtime.     cholecalciferol (VITAMIN D3) 25 MCG (1000 UNIT) tablet Take 1,000 Units by mouth daily.     letrozole (FEMARA) 2.5 MG tablet Take 1 tablet (2.5 mg total) by mouth daily. 90 tablet 3   loratadine (CLARITIN) 10 MG tablet Take 10 mg by mouth daily.     MAGNESIUM GLYCINATE PO Take 1 capsule by mouth daily.     Multiple Vitamin (MULTIVITAMIN) tablet Take 1 tablet by mouth daily.     ondansetron (ZOFRAN) 8 MG tablet Take 1 tablet (8 mg total) by mouth every 8 (eight) hours as needed for nausea or vomiting. 20 tablet 5   sertraline (ZOLOFT) 100 MG tablet Take 100 mg by mouth daily.     traMADol (ULTRAM) 50 MG tablet Take 1 tablet (50 mg total) by mouth every 6 (six) hours as needed. 30 tablet 0   Turmeric (QC TUMERIC COMPLEX PO) Take by mouth.     busPIRone (BUSPAR) 10 MG tablet Take 10 mg by mouth 2 (two) times daily as needed (anxiety). Taking it daily     prochlorperazine (COMPAZINE) 10 MG tablet Take 1 tablet (10 mg total) by mouth every 6 (six) hours as needed for nausea or vomiting. (Patient not taking: Reported on 01/14/2022) 30 tablet 3   No current facility-administered medications for this visit.    Medication  reconciliation is based on the patient's most recent medication list in the electronic medical record (EMR) including herbal products and OTC medications.   The patient's medication list was reviewed today with the patient?  Yes  Drug-drug interactions (DDIs) DDIs were evaluated?  Yes Significant DDIs identified?  Ms. Cammarata is on sertraline, tramadol, and buspirone.  She takes the buspirone daily and tramadol as needed.  She also takes sertraline daily.  We discussed that these 3 agents have serotonergic properties and may increase her risk for serotonin syndrome.  We gave her literature regarding the warning signs to watch out for.  Drug-Food Interactions Drug-food interactions were evaluated?  Yes Drug-food interactions identified?  Patient knows to avoid grapefruit products while on abemaciclib  Follow-up Plan  Continue abemaciclib 100 mg by mouth every 12 hours.  Will discuss increasing dose to 150 mg every 12 hours at next visit Continue letrozole 2.5 mg by mouth daily Continue goserelin 3.6 mg subcutaneously every 28 days.  Next due 01/31/22 Obtain dental clearance prior to starting denosumab 120 mg subcutaneously every 12 weeks Continue ondansetron as needed and start prochlorperazine as needed for nausea. Continue loperamide as needed for diarrhea Labs, pharmacy clinic visit, goserelin injection tentatively on 01/30/22 Restaging scans likely in mid to late October  [name] participated in the discussion, expressed understanding, and voiced agreement with the above plan. All questions were answered to her satisfaction. The patient was advised to contact the clinic at [phone] with any questions or concerns prior to her return visit.   I spent 45 minutes assessing the patient.  Raina Mina, RPH-CPP, 01/14/2022 3:26 PM  **Disclaimer: This note was dictated with voice recognition software. Similar sounding words can inadvertently be transcribed and this note may  contain transcription errors which may not have been corrected upon publication  of note.**

## 2022-01-15 ENCOUNTER — Other Ambulatory Visit: Payer: Self-pay

## 2022-01-15 ENCOUNTER — Encounter: Payer: Self-pay | Admitting: Specialist

## 2022-01-15 ENCOUNTER — Ambulatory Visit
Admission: RE | Admit: 2022-01-15 | Discharge: 2022-01-15 | Disposition: A | Discharge status: Home / Self Care | Payer: 59 | Source: Ambulatory Visit | Attending: Radiation Oncology | Admitting: Radiation Oncology

## 2022-01-15 ENCOUNTER — Ambulatory Visit: Payer: 59

## 2022-01-15 DIAGNOSIS — Z5111 Encounter for antineoplastic chemotherapy: Secondary | ICD-10-CM | POA: Diagnosis not present

## 2022-01-15 LAB — RAD ONC ARIA SESSION SUMMARY
Course Elapsed Days: 13
Plan Fractions Treated to Date: 9
Plan Prescribed Dose Per Fraction: 3 Gy
Plan Total Fractions Prescribed: 10
Plan Total Prescribed Dose: 30 Gy
Reference Point Dosage Given to Date: 27 Gy
Reference Point Session Dosage Given: 3 Gy
Session Number: 9

## 2022-01-16 ENCOUNTER — Ambulatory Visit
Admission: RE | Admit: 2022-01-16 | Discharge: 2022-01-16 | Disposition: A | Discharge status: Home / Self Care | Payer: 59 | Source: Ambulatory Visit | Attending: Radiation Oncology | Admitting: Radiation Oncology

## 2022-01-16 ENCOUNTER — Other Ambulatory Visit: Payer: Self-pay

## 2022-01-16 ENCOUNTER — Encounter: Payer: Self-pay | Admitting: Radiation Oncology

## 2022-01-16 DIAGNOSIS — Z5111 Encounter for antineoplastic chemotherapy: Secondary | ICD-10-CM | POA: Diagnosis not present

## 2022-01-16 LAB — RAD ONC ARIA SESSION SUMMARY
Course Elapsed Days: 14
Plan Fractions Treated to Date: 10
Plan Prescribed Dose Per Fraction: 3 Gy
Plan Total Fractions Prescribed: 10
Plan Total Prescribed Dose: 30 Gy
Reference Point Dosage Given to Date: 30 Gy
Reference Point Session Dosage Given: 3 Gy
Session Number: 10

## 2022-01-21 ENCOUNTER — Other Ambulatory Visit: Payer: Self-pay | Admitting: Hematology and Oncology

## 2022-01-21 ENCOUNTER — Other Ambulatory Visit: Payer: Self-pay | Admitting: Pharmacist

## 2022-01-21 ENCOUNTER — Telehealth: Payer: Self-pay | Admitting: Pharmacist

## 2022-01-21 DIAGNOSIS — C50911 Malignant neoplasm of unspecified site of right female breast: Secondary | ICD-10-CM

## 2022-01-21 MED ORDER — ABEMACICLIB 150 MG PO TABS: 150.0000 mg | ORAL_TABLET | Freq: Two times a day (BID) | ORAL | 5 refills | Status: DC

## 2022-01-21 NOTE — Telephone Encounter (Signed)
[place]       Telephone: [phone]?Fax: [phone]   Oncology Clinical Pharmacist Practitioner Progress Note  Linda Gutierrez is a 46 y.o. female with a diagnosis of breast cancer currently on abemaciclib under the care of Dr. Nicholas Lose. They were contacted today via telephone.  We verified that we are talking to the correct patient using 2 patient identifiers.  Interval History Clinical pharmacy received a refill request from [place] for the patient's abemaciclib prescription.  She has labs and a visit with clinical pharmacy scheduled for 01/30/22.  We contacted the patient to see how she was tolerating the abemaciclib.  She stated that she is having some nausea and diarrhea but is wanting to go up on the dose to 150 mg every 12 hours.  She has about 1 week left of medication per her report.  We have sent this new prescription to [facility] and we will plan on seeing Linda Gutierrez next week.  She knows to contact Dr. Geralyn Flash clinic should she have any questions or concerns in the interim.  Follow-Up Plan Increase abemaciclib to 150 mg by mouth every 12 hours.  New prescription sent to [facility]. Labs, pharmacy clinic visit, goserelin on 01/30/22. Will start bone strengthener for known bone metastasis once she has dental clearance.  Linda Gutierrez participated in the discussion, expressed understanding, and voiced agreement with the above plan. All questions were answered to her satisfaction. The patient was advised to contact the clinic at [phone] with any questions or concerns prior to her return visit.  Clinical pharmacy will continue to support Linda Gutierrez and Dr. Nicholas Lose as needed.  I spent 15 minutes assessing the patient.  Raina Mina, RPH-CPP,  01/21/2022  2:12 PM   **Disclaimer: This note was dictated with voice recognition software. Similar sounding words can inadvertently be transcribed and  this note may contain transcription errors which may not have been corrected upon publication of note.**

## 2022-01-28 ENCOUNTER — Encounter: Payer: No Typology Code available for payment source | Admitting: Adult Health

## 2022-01-28 ENCOUNTER — Encounter: Payer: Self-pay | Admitting: Gynecologic Oncology

## 2022-01-30 ENCOUNTER — Other Ambulatory Visit: Payer: Self-pay

## 2022-01-30 ENCOUNTER — Inpatient Hospital Stay: Payer: 59

## 2022-01-30 ENCOUNTER — Encounter: Payer: Self-pay | Admitting: Gynecologic Oncology

## 2022-01-30 ENCOUNTER — Inpatient Hospital Stay (HOSPITAL_BASED_OUTPATIENT_CLINIC_OR_DEPARTMENT_OTHER): Payer: 59 | Admitting: Gynecologic Oncology

## 2022-01-30 ENCOUNTER — Inpatient Hospital Stay: Payer: 59 | Admitting: Pharmacist

## 2022-01-30 VITALS — BP 135/77 | HR 65 | Temp 97.8°F | Resp 16 | Ht 68.0 in | Wt 233.4 lb

## 2022-01-30 DIAGNOSIS — Z170 Estrogen receptor positive status [ER+]: Secondary | ICD-10-CM | POA: Diagnosis not present

## 2022-01-30 DIAGNOSIS — C50919 Malignant neoplasm of unspecified site of unspecified female breast: Secondary | ICD-10-CM

## 2022-01-30 DIAGNOSIS — C50911 Malignant neoplasm of unspecified site of right female breast: Secondary | ICD-10-CM

## 2022-01-30 DIAGNOSIS — Z87410 Personal history of cervical dysplasia: Secondary | ICD-10-CM | POA: Diagnosis not present

## 2022-01-30 DIAGNOSIS — Z5111 Encounter for antineoplastic chemotherapy: Secondary | ICD-10-CM | POA: Diagnosis not present

## 2022-01-30 LAB — CMP (CANCER CENTER ONLY)
ALT: 21 U/L (ref 0–44)
AST: 18 U/L (ref 15–41)
Albumin: 3.8 g/dL (ref 3.5–5.0)
Alkaline Phosphatase: 59 U/L (ref 38–126)
Anion gap: 4 — ABNORMAL LOW (ref 5–15)
BUN: 9 mg/dL (ref 6–20)
CO2: 28 mmol/L (ref 22–32)
Calcium: 8.8 mg/dL — ABNORMAL LOW (ref 8.9–10.3)
Chloride: 107 mmol/L (ref 98–111)
Creatinine: 0.75 mg/dL (ref 0.44–1.00)
GFR, Estimated: >60 mL/min (ref >60)
Glucose, Bld: 91 mg/dL (ref 70–99)
Potassium: 3.8 mmol/L (ref 3.5–5.1)
Sodium: 139 mmol/L (ref 135–145)
Total Bilirubin: 0.5 mg/dL (ref 0.3–1.2)
Total Protein: 6.9 g/dL (ref 6.5–8.1)

## 2022-01-30 LAB — CBC WITH DIFFERENTIAL (CANCER CENTER ONLY)
Abs Immature Granulocytes: 0.02 10*3/uL (ref 0.00–0.07)
Basophils Absolute: 0 10*3/uL (ref 0.0–0.1)
Basophils Relative: 1 %
Eosinophils Absolute: 0.2 10*3/uL (ref 0.0–0.5)
Eosinophils Relative: 5 %
HCT: 35.5 % — ABNORMAL LOW (ref 36.0–46.0)
Hemoglobin: 12.9 g/dL (ref 12.0–15.0)
Immature Granulocytes: 1 %
Lymphocytes Relative: 16 %
Lymphs Abs: 0.7 10*3/uL (ref 0.7–4.0)
MCH: 32.7 pg (ref 26.0–34.0)
MCHC: 36.3 g/dL — ABNORMAL HIGH (ref 30.0–36.0)
MCV: 90.1 fL (ref 80.0–100.0)
Monocytes Absolute: 0.3 10*3/uL (ref 0.1–1.0)
Monocytes Relative: 7 %
Neutro Abs: 2.9 10*3/uL (ref 1.7–7.7)
Neutrophils Relative %: 70 %
Platelet Count: 175 10*3/uL (ref 150–400)
RBC: 3.94 MIL/uL (ref 3.87–5.11)
RDW: 12.4 % (ref 11.5–15.5)
WBC Count: 4.1 10*3/uL (ref 4.0–10.5)
nRBC: 0 % (ref 0.0–0.2)

## 2022-01-30 MED ORDER — GOSERELIN ACETATE 3.6 MG ~~LOC~~ IMPL
3.6000 mg | DRUG_IMPLANT | Freq: Once | SUBCUTANEOUS | Status: AC
  Administered 2022-01-30: 3.6 mg via SUBCUTANEOUS
  Filled 2022-01-30: qty 3.6

## 2022-01-30 NOTE — Progress Notes (Signed)
GYNECOLOGIC ONCOLOGY NEW PATIENT CONSULTATION   Patient Name: Linda Gutierrez  Patient Age: 46 y.o. Date of Service: 01/30/22 Referring Provider: Nicholas Lose MD  Primary Care Provider: Janie Morning, DO Consulting Provider: Jeral Pinch, MD   Assessment/Plan:  Premenopausal patient with estrogen receptor positive breast cancer.  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  We reviewed that this can be done in multiple ways, one with ovarian suppression using medication and the other with ovarian excision.  The patient has been on suppression but prefers to move forward with bilateral oophorectomy.  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 While this mutation is associated with an increased risk of breast cancer, there is no established association with an increased risk of ovarian cancer.  The patient previously had a vaginal hysterectomy.  She is unsure whether her fallopian tubes were taken at the time and her surgery was done outside of our system.  I am unable to see the records in care everywhere.  Discussed recommendation for removal of any native fallopian tube that is still in situ at the time of removal of bilateral ovaries.  Plan would be for laparoscopic surgery using robotic approach.  Discussed that in the setting of prior pelvic surgery, there may be some adhesions.  This increases the risk of damage to surrounding structures in the pelvis.  We discussed the plan for robotic assisted bilateral salpingo-oophorectomy, any other indicated procedures. The risks of surgery were discussed in detail and she understands these to include infection; wound separation; hernia; injury to adjacent organs such as bowel, bladder, blood vessels, ureters and nerves; bleeding which may require blood transfusion; anesthesia risk; thromboembolic events; possible death; unforeseen  complications; possible need for re-exploration; medical complications such as heart attack, stroke, pleural effusion and pneumonia. The patient will receive DVT and antibiotic prophylaxis as indicated. She voiced a clear understanding. She had the opportunity to ask questions. Perioperative instructions were reviewed with her. Prescriptions for post-op medications were sent to her pharmacy of choice.  In the setting of what sounds like high-grade cervical dysplasia requiring at least an excisional procedure prior to her hysterectomy, encouraged continued close follow-up with her OB/GYN.  We will tentatively schedule surgery at the beginning of September with plan to hold her Verzenio for 10 days.  A copy of this note was sent to the patient's referring provider.   45 minutes of total time was spent for this patient encounter, including preparation, face-to-face counseling with the patient and coordination of care, and documentation of the encounter.   Jeral Pinch, MD  Division of Gynecologic Oncology  Department of Obstetrics and Gynecology  [facility]  ___________________________________________  Chief Complaint: No chief complaint on file.   History of Present Illness:  Linda Gutierrez is a 46 y.o. y.o. female who is seen in consultation at the request of Dr. Lindi Adie for an evaluation of ovarian excision in the setting of recurrent hormone positive breast cancer.  Patient's breast cancer history as noted below.  She was diagnosed with recurrence in July of this year, tumor is ER/PR positive.  In the setting of her metastatic disease, patient was started on Zoladex with letrozole and Verzenio.  Hormone testing in mid July showed an FSH of 6.9, estradiol of 62.7.  Both confirm premenopausal status.  Today, patient reports that she is doing well.  Side effects from Verzenio have improved.  She got her second Zoladex  injection today.  She has had some  occasional hot flashes, otherwise denies menopausal symptoms.  She denies any pelvic pain.  Continues to have right hip pain.  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 MRI Breast (11/21/13): large geographic areas of the right breast malignancy within the 6 to 8 o'clock position right breast lower outer quadrant extending from the chest wall to the retroareolar area of the right breast. Multiple suspicious nodules within the right axilla. 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    04/18/2014 Surgery   Bilateral mastectomies  Right: DCIS but no residual invasive carcinoma. Margins were uninvolved. Sentinel lymph node had a focus of metastatic carcinoma measuring 0.4 cm surgical stage was Tis N1A. Left: benign     Chemotherapy   Received neoadjuvant chemotherapy with Adriamycin and Cytoxan x4.  She then had surgery and then received adjuvant therapy with Taxol weekly x12        - 11/2018 Anti-estrogen oral therapy   Adjuvant antiestrogen therapy with tamoxifen completed June 2020   12/18/2021 Relapse/Recurrence   Palpable lesion in the right breast.  Mammogram and ultrasound at [facility] revealed recurrent nodules in the reconstructed right breast along with enlarged lymph nodes.  Biopsy of the breast and the lymph nodes was performed that revealed breast cancer that was ER/PR 100% positive HER2 negative.  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   Asthma    Breast  cancer ([place]) 2015   rt side   Depression    History of hiatal hernia    Pneumonia    STD (sexually transmitted disease)    HPV   Urinary incontinence      PAST SURGICAL HISTORY:  Past Surgical History:  Procedure Laterality Date   BREAST RECONSTRUCTION     BREAST RECONSTRUCTION     with implants   CERVICAL BIOPSY  W/ LOOP ELECTRODE EXCISION     COLPOSCOPY     MASTECTOMY Bilateral 2017   TOTAL VAGINAL HYSTERECTOMY      OB/GYN HISTORY:  OB History  Gravida Para Term Preterm AB Living  2         2  SAB IAB Ectopic Multiple Live Births               # Outcome Date GA Lbr Len/2nd Weight Sex Delivery Anes PTL Lv  2 Gravida           1 Gravida             No LMP recorded. Patient has had a hysterectomy.  Age at menarche: 78 Age at menopause: Not applicable Hx of HRT: Denies Hx of STDs: Denies Last pap: Patient thinks this was in either 2020 or 2022 History of abnormal pap smears: Yes, ultimately had a hysterectomy 7 years ago for her history of dysplasia.  Follows with [facility].  MEDICATIONS: Outpatient Encounter Medications as of 01/30/2022  Medication Sig   abemaciclib (VERZENIO) 150 MG tablet Take 1  tablet (150 mg total) by mouth 2 (two) times daily.   albuterol (VENTOLIN HFA) 108 (90 Base) MCG/ACT inhaler Inhale 1-2 puffs into the lungs every 6 (six) hours as needed for wheezing or shortness of breath.   busPIRone (BUSPAR) 10 MG tablet Take 10 mg by mouth 2 (two) times daily as needed (anxiety). Taking it daily   calcium carbonate (OS-CAL - DOSED IN MG OF ELEMENTAL CALCIUM) 1250 (500 Ca) MG tablet Take 1 tablet by mouth at bedtime.   cholecalciferol (VITAMIN D3) 25 MCG (1000 UNIT) tablet Take 1,000 Units by mouth daily.   letrozole (FEMARA) 2.5 MG tablet Take 1 tablet (2.5 mg total) by mouth daily.   loratadine (CLARITIN) 10 MG tablet Take 10 mg by mouth daily.   MAGNESIUM GLYCINATE PO Take 1 capsule by mouth daily.   Multiple Vitamin (MULTIVITAMIN) tablet  Take 1 tablet by mouth daily.   ondansetron (ZOFRAN) 8 MG tablet Take 1 tablet (8 mg total) by mouth every 8 (eight) hours as needed for nausea or vomiting.   sertraline (ZOLOFT) 100 MG tablet Take 100 mg by mouth daily.   traMADol (ULTRAM) 50 MG tablet Take 1 tablet (50 mg total) by mouth every 6 (six) hours as needed.   Turmeric (QC TUMERIC COMPLEX PO) Take by mouth.   [DISCONTINUED] prochlorperazine (COMPAZINE) 10 MG tablet Take 1 tablet (10 mg total) by mouth every 6 (six) hours as needed for nausea or vomiting. (Patient not taking: Reported on 01/14/2022)   No facility-administered encounter medications on file as of 01/30/2022.    ALLERGIES:  No Known Allergies   FAMILY HISTORY:  Family History  Problem Relation Age of Onset   Thyroid disease Mother    Uterine cancer Mother    Diabetes Father    Hypertension Father    Lupus Father    Diabetes Maternal Grandmother    Heart disease Maternal Grandmother    Colon cancer Neg Hx    Breast cancer Neg Hx    Ovarian cancer Neg Hx    Endometrial cancer Neg Hx    Pancreatic cancer Neg Hx    Prostate cancer Neg Hx      SOCIAL HISTORY:  Social Connections: Not on file    REVIEW OF SYSTEMS:  + Fatigue, diarrhea, right hip pain, back pain, anxiety. Denies appetite changes, fevers, chills, unexplained weight changes. Denies hearing loss, neck lumps or masses, mouth sores, ringing in ears or voice changes. Denies cough or wheezing.  Denies shortness of breath. Denies chest pain or palpitations. Denies leg swelling. Denies abdominal distention, pain, blood in stools, constipation, nausea, vomiting, or early satiety. Denies pain with intercourse, dysuria, frequency, hematuria or incontinence. Denies hot flashes, vaginal bleeding or vaginal discharge.   Denies muscle pain/cramps. Denies itching, rash, or wounds. Denies dizziness, headaches, numbness or seizures. Denies swollen lymph nodes or glands, denies easy bruising or  bleeding. Denies depression, confusion, or decreased concentration.  Physical Exam:  Vital Signs for this encounter:  Blood pressure 135/77, pulse 65, temperature 97.8 F (36.6 C), temperature source Oral, resp. rate 16, height _0  (1.727 m), weight 233 lb 6.4 oz (105.9 kg), SpO2 100 %. Body mass index is 35.49 kg/m. General: Alert, oriented, no acute distress.  HEENT: Normocephalic, atraumatic. Sclera anicteric.  Chest: Clear to auscultation bilaterally. No wheezes, rhonchi, or rales. Cardiovascular: Regular rate and rhythm, no murmurs, rubs, or gallops.  Abdomen: Obese. Normoactive bowel sounds. Soft, nondistended, nontender to palpation. No masses or hepatosplenomegaly appreciated. No palpable fluid wave.  Extremities: Grossly normal range of motion. Warm, well perfused. No edema bilaterally.  Skin: No rashes or lesions.  Lymphatics: No cervical, supraclavicular, or inguinal adenopathy.  GU:  Normal external female genitalia. No lesions. No discharge or bleeding.             Bladder/urethra:  No lesions or masses, well supported bladder             Vagina: Well rugated, no lesions or masses.             Cervix/uterus: Surgically absent.             Adnexa: No masses appreciated.  Rectal: Deferred.  LABORATORY AND RADIOLOGIC DATA:  Outside medical records were reviewed to synthesize the above history, along with the history and physical obtained during the visit.   Lab Results  Component Value Date   WBC 4.1 01/30/2022   HGB 12.9 01/30/2022   HCT 35.5 (L) 01/30/2022   PLT 175 01/30/2022   GLUCOSE 96 01/14/2022   ALT 17 01/14/2022   AST 19 01/14/2022   NA 138 01/14/2022   K 4.7 01/14/2022   CL 106 01/14/2022   CREATININE 0.93 01/14/2022   BUN 11 01/14/2022   CO2 29 01/14/2022   TSH 2.64 07/27/2020   HGBA1C 5.1 02/25/2018

## 2022-01-30 NOTE — H&P (View-Only) (Signed)
GYNECOLOGIC ONCOLOGY NEW PATIENT CONSULTATION   Patient Name: Linda Gutierrez  Patient Age: 46 y.o. Date of Service: 01/30/22 Referring Provider: Nicholas Lose MD  Primary Care Provider: Janie Morning, DO Consulting Provider: Jeral Pinch, MD   Assessment/Plan:  Premenopausal patient with estrogen receptor positive breast cancer.  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  We reviewed that this can be done in multiple ways, one with ovarian suppression using medication and the other with ovarian excision.  The patient has been on suppression but prefers to move forward with bilateral oophorectomy.  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 While this mutation is associated with an increased risk of breast cancer, there is no established association with an increased risk of ovarian cancer.  The patient previously had a vaginal hysterectomy.  She is unsure whether her fallopian tubes were taken at the time and her surgery was done outside of our system.  I am unable to see the records in care everywhere.  Discussed recommendation for removal of any native fallopian tube that is still in situ at the time of removal of bilateral ovaries.  Plan would be for laparoscopic surgery using robotic approach.  Discussed that in the setting of prior pelvic surgery, there may be some adhesions.  This increases the risk of damage to surrounding structures in the pelvis.  We discussed the plan for robotic assisted bilateral salpingo-oophorectomy, any other indicated procedures. The risks of surgery were discussed in detail and she understands these to include infection; wound separation; hernia; injury to adjacent organs such as bowel, bladder, blood vessels, ureters and nerves; bleeding which may require blood transfusion; anesthesia risk; thromboembolic events; possible death; unforeseen  complications; possible need for re-exploration; medical complications such as heart attack, stroke, pleural effusion and pneumonia. The patient will receive DVT and antibiotic prophylaxis as indicated. She voiced a clear understanding. She had the opportunity to ask questions. Perioperative instructions were reviewed with her. Prescriptions for post-op medications were sent to her pharmacy of choice.  In the setting of what sounds like high-grade cervical dysplasia requiring at least an excisional procedure prior to her hysterectomy, encouraged continued close follow-up with her OB/GYN.  We will tentatively schedule surgery at the beginning of September with plan to hold her Verzenio for 10 days.  A copy of this note was sent to the patient's referring provider.   45 minutes of total time was spent for this patient encounter, including preparation, face-to-face counseling with the patient and coordination of care, and documentation of the encounter.   Jeral Pinch, MD  Division of Gynecologic Oncology  Department of Obstetrics and Gynecology  [facility]  ___________________________________________  Chief Complaint: No chief complaint on file.   History of Present Illness:  Linda Gutierrez is a 46 y.o. y.o. female who is seen in consultation at the request of Dr. Lindi Adie for an evaluation of ovarian excision in the setting of recurrent hormone positive breast cancer.  Patient's breast cancer history as noted below.  She was diagnosed with recurrence in July of this year, tumor is ER/PR positive.  In the setting of her metastatic disease, patient was started on Zoladex with letrozole and Verzenio.  Hormone testing in mid July showed an FSH of 6.9, estradiol of 62.7.  Both confirm premenopausal status.  Today, patient reports that she is doing well.  Side effects from Verzenio have improved.  She got her second Zoladex  injection today.  She has had some  occasional hot flashes, otherwise denies menopausal symptoms.  She denies any pelvic pain.  Continues to have right hip pain.  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 MRI Breast (11/21/13): large geographic areas of the right breast malignancy within the 6 to 8 o'clock position right breast lower outer quadrant extending from the chest wall to the retroareolar area of the right breast. Multiple suspicious nodules within the right axilla. 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    04/18/2014 Surgery   Bilateral mastectomies  Right: DCIS but no residual invasive carcinoma. Margins were uninvolved. Sentinel lymph node had a focus of metastatic carcinoma measuring 0.4 cm surgical stage was Tis N1A. Left: benign     Chemotherapy   Received neoadjuvant chemotherapy with Adriamycin and Cytoxan x4.  She then had surgery and then received adjuvant therapy with Taxol weekly x12        - 11/2018 Anti-estrogen oral therapy   Adjuvant antiestrogen therapy with tamoxifen completed June 2020   12/18/2021 Relapse/Recurrence   Palpable lesion in the right breast.  Mammogram and ultrasound at [facility] revealed recurrent nodules in the reconstructed right breast along with enlarged lymph nodes.  Biopsy of the breast and the lymph nodes was performed that revealed breast cancer that was ER/PR 100% positive HER2 negative.  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   Asthma    Breast  cancer ([place]) 2015   rt side   Depression    History of hiatal hernia    Pneumonia    STD (sexually transmitted disease)    HPV   Urinary incontinence      PAST SURGICAL HISTORY:  Past Surgical History:  Procedure Laterality Date   BREAST RECONSTRUCTION     BREAST RECONSTRUCTION     with implants   CERVICAL BIOPSY  W/ LOOP ELECTRODE EXCISION     COLPOSCOPY     MASTECTOMY Bilateral 2017   TOTAL VAGINAL HYSTERECTOMY      OB/GYN HISTORY:  OB History  Gravida Para Term Preterm AB Living  2         2  SAB IAB Ectopic Multiple Live Births               # Outcome Date GA Lbr Len/2nd Weight Sex Delivery Anes PTL Lv  2 Gravida           1 Gravida             No LMP recorded. Patient has had a hysterectomy.  Age at menarche: 52 Age at menopause: Not applicable Hx of HRT: Denies Hx of STDs: Denies Last pap: Patient thinks this was in either 2020 or 2022 History of abnormal pap smears: Yes, ultimately had a hysterectomy 7 years ago for her history of dysplasia.  Follows with [facility].  MEDICATIONS: Outpatient Encounter Medications as of 01/30/2022  Medication Sig   abemaciclib (VERZENIO) 150 MG tablet Take 1  tablet (150 mg total) by mouth 2 (two) times daily.   albuterol (VENTOLIN HFA) 108 (90 Base) MCG/ACT inhaler Inhale 1-2 puffs into the lungs every 6 (six) hours as needed for wheezing or shortness of breath.   busPIRone (BUSPAR) 10 MG tablet Take 10 mg by mouth 2 (two) times daily as needed (anxiety). Taking it daily   calcium carbonate (OS-CAL - DOSED IN MG OF ELEMENTAL CALCIUM) 1250 (500 Ca) MG tablet Take 1 tablet by mouth at bedtime.   cholecalciferol (VITAMIN D3) 25 MCG (1000 UNIT) tablet Take 1,000 Units by mouth daily.   letrozole (FEMARA) 2.5 MG tablet Take 1 tablet (2.5 mg total) by mouth daily.   loratadine (CLARITIN) 10 MG tablet Take 10 mg by mouth daily.   MAGNESIUM GLYCINATE PO Take 1 capsule by mouth daily.   Multiple Vitamin (MULTIVITAMIN) tablet  Take 1 tablet by mouth daily.   ondansetron (ZOFRAN) 8 MG tablet Take 1 tablet (8 mg total) by mouth every 8 (eight) hours as needed for nausea or vomiting.   sertraline (ZOLOFT) 100 MG tablet Take 100 mg by mouth daily.   traMADol (ULTRAM) 50 MG tablet Take 1 tablet (50 mg total) by mouth every 6 (six) hours as needed.   Turmeric (QC TUMERIC COMPLEX PO) Take by mouth.   [DISCONTINUED] prochlorperazine (COMPAZINE) 10 MG tablet Take 1 tablet (10 mg total) by mouth every 6 (six) hours as needed for nausea or vomiting. (Patient not taking: Reported on 01/14/2022)   No facility-administered encounter medications on file as of 01/30/2022.    ALLERGIES:  No Known Allergies   FAMILY HISTORY:  Family History  Problem Relation Age of Onset   Thyroid disease Mother    Uterine cancer Mother    Diabetes Father    Hypertension Father    Lupus Father    Diabetes Maternal Grandmother    Heart disease Maternal Grandmother    Colon cancer Neg Hx    Breast cancer Neg Hx    Ovarian cancer Neg Hx    Endometrial cancer Neg Hx    Pancreatic cancer Neg Hx    Prostate cancer Neg Hx      SOCIAL HISTORY:  Social Connections: Not on file    REVIEW OF SYSTEMS:  + Fatigue, diarrhea, right hip pain, back pain, anxiety. Denies appetite changes, fevers, chills, unexplained weight changes. Denies hearing loss, neck lumps or masses, mouth sores, ringing in ears or voice changes. Denies cough or wheezing.  Denies shortness of breath. Denies chest pain or palpitations. Denies leg swelling. Denies abdominal distention, pain, blood in stools, constipation, nausea, vomiting, or early satiety. Denies pain with intercourse, dysuria, frequency, hematuria or incontinence. Denies hot flashes, vaginal bleeding or vaginal discharge.   Denies muscle pain/cramps. Denies itching, rash, or wounds. Denies dizziness, headaches, numbness or seizures. Denies swollen lymph nodes or glands, denies easy bruising or  bleeding. Denies depression, confusion, or decreased concentration.  Physical Exam:  Vital Signs for this encounter:  Blood pressure 135/77, pulse 65, temperature 97.8 F (36.6 C), temperature source Oral, resp. rate 16, height _0  (1.727 m), weight 233 lb 6.4 oz (105.9 kg), SpO2 100 %. Body mass index is 35.49 kg/m. General: Alert, oriented, no acute distress.  HEENT: Normocephalic, atraumatic. Sclera anicteric.  Chest: Clear to auscultation bilaterally. No wheezes, rhonchi, or rales. Cardiovascular: Regular rate and rhythm, no murmurs, rubs, or gallops.  Abdomen: Obese. Normoactive bowel sounds. Soft, nondistended, nontender to palpation. No masses or hepatosplenomegaly appreciated. No palpable fluid wave.  Extremities: Grossly normal range of motion. Warm, well perfused. No edema bilaterally.  Skin: No rashes or lesions.  Lymphatics: No cervical, supraclavicular, or inguinal adenopathy.  GU:  Normal external female genitalia. No lesions. No discharge or bleeding.             Bladder/urethra:  No lesions or masses, well supported bladder             Vagina: Well rugated, no lesions or masses.             Cervix/uterus: Surgically absent.             Adnexa: No masses appreciated.  Rectal: Deferred.  LABORATORY AND RADIOLOGIC DATA:  Outside medical records were reviewed to synthesize the above history, along with the history and physical obtained during the visit.   Lab Results  Component Value Date   WBC 4.1 01/30/2022   HGB 12.9 01/30/2022   HCT 35.5 (L) 01/30/2022   PLT 175 01/30/2022   GLUCOSE 96 01/14/2022   ALT 17 01/14/2022   AST 19 01/14/2022   NA 138 01/14/2022   K 4.7 01/14/2022   CL 106 01/14/2022   CREATININE 0.93 01/14/2022   BUN 11 01/14/2022   CO2 29 01/14/2022   TSH 2.64 07/27/2020   HGBA1C 5.1 02/25/2018

## 2022-01-30 NOTE — Patient Instructions (Addendum)
Preparing for your Surgery  Plan for surgery on February 11, 2022 with Dr. Jeral Pinch at [facility] will be scheduled for robotic assisted laparoscopic bilateral salpingo-oophorectomy (removal of fallopian tubes and ovaries through small incisions), and any other indicated procedures.  You need to stop taking Verzenio now. We will let you know about the letrozole. Stop taking tumeric now as well.   Pre-operative Testing -You will receive a phone call from presurgical testing at [facility] to arrange for a pre-operative appointment and lab work.  -Bring your insurance card, copy of an advanced directive if applicable, medication list  -At that visit, you will be asked to sign a consent for a possible blood transfusion in case a transfusion becomes necessary during surgery.  The need for a blood transfusion is rare but having consent is a necessary part of your care.     -You should not be taking blood thinners or aspirin at least ten days prior to surgery unless instructed by your surgeon.  -Do not take supplements such as fish oil (omega 3), red yeast rice, turmeric before your surgery. You want to avoid medications with aspirin in them including headache powders such as BC or Goody's), Excedrin migraine.  Day Before Surgery at [place] will be asked to take in a light diet the day before surgery. You will be advised you can have clear liquids up until 3 hours before your surgery.    Eat a light diet the day before surgery.  Examples including soups, broths, toast, yogurt, mashed potatoes.  AVOID GAS PRODUCING FOODS. Things to avoid include carbonated beverages (fizzy beverages, sodas), raw fruits and raw vegetables (uncooked), or beans.   If your bowels are filled with gas, your surgeon will have difficulty visualizing your pelvic organs which increases your surgical risks.  Your role in recovery Your role is to become active as soon as directed by your  doctor, while still giving yourself time to heal.  Rest when you feel tired. You will be asked to do the following in order to speed your recovery:  - Cough and breathe deeply. This helps to clear and expand your lungs and can prevent pneumonia after surgery.  - [place]. Do mild physical activity. Walking or moving your legs help your circulation and body functions return to normal. Do not try to get up or walk alone the first time after surgery.   -If you develop swelling on one leg or the other, pain in the back of your leg, redness/warmth in one of your legs, please call the office or go to the Emergency Room to have a doppler to rule out a blood clot. For shortness of breath, chest pain-seek care in the Emergency Room as soon as possible. - Actively manage your pain. Managing your pain lets you move in comfort. We will ask you to rate your pain on a scale of zero to 10. It is your responsibility to tell your doctor or nurse where and how much you hurt so your pain can be treated.  Special Considerations -If you are diabetic, you may be placed on insulin after surgery to have closer control over your blood sugars to promote healing and recovery.  This does not mean that you will be discharged on insulin.  If applicable, your oral antidiabetics will be resumed when you are tolerating a solid diet.  -Your final pathology results from surgery should be available around one week after surgery  and the results will be relayed to you when available.  -FMLA forms can be faxed to [phone] and please allow 5-7 business days for completion.  Pain Management After Surgery -You can use the tramadol you have at home if needed.   -Make sure that you have Tylenol and Ibuprofen IF YOU ARE ABLE TO TAKE THESE MEDICATIONS at home to use on a regular basis after surgery for pain control. We recommend alternating the medications every hour to six hours since they work differently and are  processed in the body differently for pain relief.  -Review the attached handout on narcotic use and their risks and side effects.   Bowel Regimen -You can use the medication you have at home. You should take something every day for the first week after surgery.  It is important to prevent constipation and drink adequate amounts of liquids.  Risks of Surgery Risks of surgery are low but include bleeding, infection, damage to surrounding structures, re-operation, blood clots, and very rarely death.   Blood Transfusion Information (For the consent to be signed before surgery)  We will be checking your blood type before surgery so in case of emergencies, we will know what type of blood you would need.                                            WHAT IS A BLOOD TRANSFUSION?  A transfusion is the replacement of blood or some of its parts. Blood is made up of multiple cells which provide different functions. Red blood cells carry oxygen and are used for blood loss replacement. White blood cells fight against infection. Platelets control bleeding. Plasma helps clot blood. Other blood products are available for specialized needs, such as hemophilia or other clotting disorders. BEFORE THE TRANSFUSION  Who gives blood for transfusions?  You may be able to donate blood to be used at a later date on yourself (autologous donation). Relatives can be asked to donate blood. This is generally not any safer than if you have received blood from a stranger. The same precautions are taken to ensure safety when a relative's blood is donated. Healthy volunteers who are fully evaluated to make sure their blood is safe. This is blood bank blood. Transfusion therapy is the safest it has ever been in the practice of medicine. Before blood is taken from a donor, a complete history is taken to make sure that person has no history of diseases nor engages in risky social behavior (examples are intravenous drug use or  sexual activity with multiple partners). The donor's travel history is screened to minimize risk of transmitting infections, such as malaria. The donated blood is tested for signs of infectious diseases, such as HIV and hepatitis. The blood is then tested to be sure it is compatible with you in order to minimize the chance of a transfusion reaction. If you or a relative donates blood, this is often done in anticipation of surgery and is not appropriate for emergency situations. It takes many days to process the donated blood. RISKS AND COMPLICATIONS Although transfusion therapy is very safe and saves many lives, the main dangers of transfusion include:  Getting an infectious disease. Developing a transfusion reaction. This is an allergic reaction to something in the blood you were given. Every precaution is taken to prevent this. The decision to have a blood transfusion has  been considered carefully by your caregiver before blood is given. Blood is not given unless the benefits outweigh the risks.  AFTER SURGERY INSTRUCTIONS  Return to work: 4-6 weeks if applicable  Activity: 1. Be up and out of the bed during the day.  Take a nap if needed.  You may walk up steps but be careful and use the hand rail.  Stair climbing will tire you more than you think, you may need to stop part way and rest.   2. No lifting or straining for 6 weeks over 10 pounds. No pushing, pulling, straining for 6 weeks.  3. No driving for around 1 week(s).  Do not drive if you are taking narcotic pain medicine and make sure that your reaction time has returned.   4. You can shower as soon as the next day after surgery. Shower daily.  Use your regular soap and water (not directly on the incision) and pat your incision(s) dry afterwards; don't rub.  No tub baths or submerging your body in water until cleared by your surgeon. If you have the soap that was given to you by pre-surgical testing that was used before surgery, you do not  need to use it afterwards because this can irritate your incisions.   5. No sexual activity and nothing in the vagina for 4 weeks.  6. You may experience a small amount of clear drainage from your incisions, which is normal.  If the drainage persists, increases, or changes color please call the office.  7. Do not use creams, lotions, or ointments such as neosporin on your incisions after surgery until advised by your surgeon because they can cause removal of the dermabond glue on your incisions.    8. You may experience vaginal spotting after surgery.  The spotting is normal but if you experience heavy bleeding, call our office.  9. Take Tylenol or ibuprofen first for pain if you are able to take these medications and only use narcotic pain medication for severe pain not relieved by the Tylenol or Ibuprofen.  Monitor your Tylenol intake to a max of 4,000 mg in a 24 hour period. You can alternate these medications after surgery.  Diet: 1. Low sodium Heart Healthy Diet is recommended but you are cleared to resume your normal (before surgery) diet after your procedure.  2. It is safe to use a laxative, such as Miralax or Colace, if you have difficulty moving your bowels.   Wound Care: 1. Keep clean and dry.  Shower daily.  Reasons to call the Doctor: Fever - Oral temperature greater than 100.4 degrees Fahrenheit Foul-smelling vaginal discharge Difficulty urinating Nausea and vomiting Increased pain at the site of the incision that is unrelieved with pain medicine. Difficulty breathing with or without chest pain New calf pain especially if only on one side Sudden, continuing increased vaginal bleeding with or without clots.   Contacts: For questions or concerns you should contact:  Dr. Jeral Pinch at [phone]  Joylene John, NP at [phone]  After Hours: call [phone] and have the GYN Oncologist paged/contacted (after 5 pm or on the weekends).  Messages sent via  mychart are for non-urgent matters and are not responded to after hours so for urgent needs, please call the after hours number.

## 2022-01-30 NOTE — Progress Notes (Signed)
[place]       Telephone: [phone]?Fax: [phone]   Oncology Clinical Pharmacist Practitioner Progress Note  Linda Gutierrez was contacted via in person to discuss her chemotherapy regimen for abemaciclib which they receive under the care of Dr. Nicholas Lose.  Current treatment regimen and start date Abemaciclib (01/03/22) Letrozole (12/27/21) Goserelin (01/03/22) Denosumab 120 mg (pending dental examination scheduled for next week)  Interval History She continues on abemaciclib 150 mg by mouth every 12 hours on days 1 to 28 of a 28-day cycle. This is being given in combination with letrozole 2.5 mg by mouth daily and goserelin 3.6 mg subcutaneously every 28 days. Therapy is planned to continue until disease progression or unacceptable toxicity.   Response to Therapy Linda Gutierrez was seen today by clinical pharmacy as a follow-up to her abemaciclib management.  She last had a telephone encounter with clinical pharmacy on 01/21/22 and at that time she increased her dose of abemaciclib to 150 mg every 12 hours.  She last saw Dr. Lindi Adie on 01/06/22.  She has a dentist appointment next week to evaluate dental clearance prior to starting denosumab 120 mg.  Per her report, she also has a BSO scheduled with Dr. Berline Lopes on 02/11/22.  She had appointment with Dr. Berline Lopes today and they advised her to hold abemaciclib starting today.  Linda Gutierrez will confirm with Dr. Berline Lopes when she should restart abemaciclib.  She is due for goserelin today and again in 28 days which would be 02/27/22.  We will set up a Dr. Lindi Adie visit with labs prior to this next injection to evaluate how long goserelin will be continued after under Dr. Berline Lopes.  She does continue to have very mild nausea and diarrhea and has been using the FODMAP diet to help with symptoms which is working well for her.  She also has ondansetron if needed and can use loperamide over-the-counter.  Labs, vitals, treatment parameters,  and manufacturer guidelines assessing toxicity were reviewed with Linda Gutierrez today. Based on these values, patient is in agreement to hold abemaciclib therapy at this time per Dr. Charisse March request related to upcoming surgery on 02/11/22.  Allergies No Known Allergies  Vitals    01/30/2022   11:14 AM 01/14/2022    2:06 PM 01/13/2022    1:39 PM  Vitals with BMI  Height '5\' 8"'$  '5\' 8"'$  '5\' 8"'$   Weight 233 lbs 6 oz 238 lbs 6 oz 240 lbs  BMI 35.5 02.77 41.2  Systolic 878 676 720  Diastolic 77 90 79  Pulse 65 72 60     Laboratory Data    Latest Ref Rng & Units 01/30/2022   12:32 PM 01/14/2022    1:43 PM 12/20/2021   12:46 PM  CBC EXTENDED  WBC 4.0 - 10.5 K/uL 4.1  4.0  8.9   RBC 3.87 - 5.11 MIL/uL 3.94  4.34  4.65   Hemoglobin 12.0 - 15.0 g/dL 12.9  14.1  15.2   HCT 36.0 - 46.0 % 35.5  40.0  42.6   Platelets 150 - 400 K/uL 175  167  248   NEUT# 1.7 - 7.7 K/uL 2.9  2.8  5.6   Lymph# 0.7 - 4.0 K/uL 0.7  0.8  2.4        Latest Ref Rng & Units 01/30/2022   12:32 PM 01/14/2022    1:43 PM 12/20/2021   12:46 PM  CMP  Glucose 70 - 99 mg/dL 91  96  114  BUN 6 - 20 mg/dL '9  11  12   '$ Creatinine 0.44 - 1.00 mg/dL 0.75  0.93  0.71   Sodium 135 - 145 mmol/L 139  138  138   Potassium 3.5 - 5.1 mmol/L 3.8  4.7  4.2   Chloride 98 - 111 mmol/L 107  106  106   CO2 22 - 32 mmol/L '28  29  26   '$ Calcium 8.9 - 10.3 mg/dL 8.8  8.9  9.4   Total Protein 6.5 - 8.1 g/dL 6.9  7.7  7.8   Total Bilirubin 0.3 - 1.2 mg/dL 0.5  0.8  0.7   Alkaline Phos 38 - 126 U/L 59  64  103   AST 15 - 41 U/L 18  19  41   ALT 0 - 44 U/L 21  17  48     No results found for: "MG"  Adverse Effects Assessment Nausea/diarrhea: Very mild.  Not using any medicinal agents at this time for either symptom.  She has been using the FODMAP diet with success  Adherence Assessment Linda Gutierrez reports missing 2 doses over the past 4 weeks.   Reason for missed dose: Forgot. Patient was re-educated on importance of adherence.   Access  Assessment Linda Gutierrez is currently receiving her abemaciclib through [employer] concerns: None  Medication Reconciliation The patient's medication list was reviewed today with the patient?  Yes New medications or herbal supplements have recently been started?  No Any medications have been discontinued?  No The medication list was updated and reconciled based on the patient's most recent medication list in the electronic medical record (EMR) including herbal products and OTC medications.   Medications Current Outpatient Medications  Medication Sig Dispense Refill   abemaciclib (VERZENIO) 150 MG tablet Take 1 tablet (150 mg total) by mouth 2 (two) times daily. 56 tablet 5   albuterol (VENTOLIN HFA) 108 (90 Base) MCG/ACT inhaler Inhale 1-2 puffs into the lungs every 6 (six) hours as needed for wheezing or shortness of breath.     busPIRone (BUSPAR) 10 MG tablet Take 10 mg by mouth 2 (two) times daily as needed (anxiety). Taking it daily     calcium carbonate (OS-CAL - DOSED IN MG OF ELEMENTAL CALCIUM) 1250 (500 Ca) MG tablet Take 1 tablet by mouth at bedtime.     cholecalciferol (VITAMIN D3) 25 MCG (1000 UNIT) tablet Take 1,000 Units by mouth daily.     letrozole (FEMARA) 2.5 MG tablet Take 1 tablet (2.5 mg total) by mouth daily. 90 tablet 3   loratadine (CLARITIN) 10 MG tablet Take 10 mg by mouth daily.     MAGNESIUM GLYCINATE PO Take 1 capsule by mouth daily.     Multiple Vitamin (MULTIVITAMIN) tablet Take 1 tablet by mouth daily.     ondansetron (ZOFRAN) 8 MG tablet Take 1 tablet (8 mg total) by mouth every 8 (eight) hours as needed for nausea or vomiting. 20 tablet 5   sertraline (ZOLOFT) 100 MG tablet Take 100 mg by mouth daily.     traMADol (ULTRAM) 50 MG tablet Take 1 tablet (50 mg total) by mouth every 6 (six) hours as needed. 30 tablet 0   Turmeric (QC TUMERIC COMPLEX PO) Take by mouth.     No current facility-administered medications for this visit.     Drug-Drug Interactions (DDIs) DDIs were evaluated?  Yes Significant DDIs?  Yes, had discussed in the past potential for serotonin syndrome with patient and warning signs  to watch out for.  She takes sertraline daily but tramadol and buspirone as needed The patient was instructed to speak with their health care provider and/or the oral chemotherapy pharmacist before starting any new drug, including prescription or over the counter, natural / herbal products, or vitamins.  Supportive Care Diarrhea: we reviewed that diarrhea is common with abemaciclib and confirmed that she does have loperamide (Imodium) at home.  We reviewed how to take this medication PRN Neutropenia: we discussed the importance of having a thermometer and what the Centers for Disease Control and Prevention (CDC) considers a fever which is 100.9F (38C) or higher.  Gave patient 24/7 triage line to call if any fevers or symptoms ILD/Pneumonitis: we reviewed potential symptoms including cough, shortness, and fatigue.  VTE: reviewed signs of DVT such as leg swelling, redness, pain, or tenderness and signs of PE such as shortness of breath, rapid or irregular heartbeat, cough, chest pain, or lightheadedness  Dosing Assessment Hepatic adjustments needed?  No Renal adjustments needed?  No Toxicity adjustments needed?  No The current dosing regimen is not appropriate to continue at this time.  As above, per Dr. Charisse March instructions, Linda Gutierrez will start to hold abemaciclib today and restart sometime after her surgery scheduled for 02/11/22.  She will ask Dr. Berline Lopes for her recommendation.  We did state, if there are no complications from surgery, we have seen surgery restart abemaciclib anywhere from 7 to 10 days postop  [place] abemaciclib starting with this evening dose per Dr. Berline Lopes.  Restart abemaciclib per Dr. Charisse March recommendations. Continue letrozole 2.5 mg by mouth daily Continue goserelin 3.6 mg  subcutaneously every 28 days.  Dose due today and again in 4 weeks. He is still planned with Dr. Berline Lopes for 02/11/22 Will add labs, Dr. Lindi Adie visit, on 02/17/22.  She will discuss at that visit how much longer she should continue on goserelin after her BSO.  Goserelin daily 02/27/22. Goserelin injection tentatively on 02/27/22 Labs, pharmacy clinic visit, on 03/13/22  Linda Gutierrez participated in the discussion, expressed understanding, and voiced agreement with the above plan. All questions were answered to her satisfaction. The patient was advised to contact the clinic at [phone] with any questions or concerns prior to her return visit.   I spent 30 minutes assessing and educating the patient.  Raina Mina, RPH-CPP, 01/30/2022  2:44 PM   **Disclaimer: This note was dictated with voice recognition software. Similar sounding words can inadvertently be transcribed and this note may contain transcription errors which may not have been corrected upon publication of note.**

## 2022-01-30 NOTE — Progress Notes (Signed)
Patient here with her mother for new patient consultation with Dr. Jeral Pinch and for a pre-operative discussion prior to her scheduled surgery on February 11, 2022. She is scheduled for robotic assisted bilateral salpingo-oophorectomy. The surgery was discussed in detail.  See after visit summary for additional details. Visual aids used to discuss items related to surgery including sequential compression stockings, foley catheter, multi-modal pain regimen including tylenol, photo of the surgical robot, female reproductive system to discuss surgery in detail.      Discussed post-op pain management in detail including the aspects of the enhanced recovery pathway. She has tramadol at home she can use if needed. We discussed the use of tylenol post-op and to monitor for a maximum of 4,000 mg in a 24 hour period along with use of ibuprofen.  She also has miralax at home and advised to take this daily after surgery to prevent constipation.  Discussed bowel regimen in detail.     Discussed the use of SCDs and measures to take at home to prevent DVT including frequent mobility.  Reportable signs and symptoms of DVT discussed. Post-operative instructions discussed and expectations for after surgery. Incisional care discussed as well including reportable signs and symptoms including erythema, drainage, wound separation.     10 minutes spent with the patient.  Verbalizing understanding of material discussed. No needs or concerns voiced at the end of the visit.   Advised patient and family to call for any needs. A message has been sent to Dr. Lindi Adie about verzenio and letrozole prior to surgery. Per Dr. Chryl Heck, pt can hold verzenio at least 10 days before surgery (starting now) and continue taking letrozole.   This appointment is included in the global surgical bundle as pre-operative teaching and has no charge.

## 2022-01-31 ENCOUNTER — Telehealth: Payer: Self-pay

## 2022-01-31 ENCOUNTER — Other Ambulatory Visit: Payer: Self-pay | Admitting: Gynecologic Oncology

## 2022-01-31 DIAGNOSIS — C50919 Malignant neoplasm of unspecified site of unspecified female breast: Secondary | ICD-10-CM

## 2022-01-31 NOTE — Telephone Encounter (Signed)
Called Patient to discuss Forms for Intermittent FMLA. Patient did not specify a beginning or ending date for her Intermittent leave request when leaving paperwork. Discussed this with Patient and inquired regarding her treatment schedule and follow up appointments. Patient became upset and stated that she just needed Intermittent Leave as needed, she didn't know when she would need it but only needed leave for 9/5 to 9/8 for her surgery at this time. Explained to Patient that this would be considered continuous leave and that it could be completed that way but inquired if she would also need Intermittent Leave for any follow-up appointments. Patient stated that if it was a problem, to just forget about it and she would work it out with her employer. Attempted to apologize to Patient and explain that we could do the Intermittent Leave later or that the paperwork could state that treatment was to be determined and Intermittent Leave not applicable at this time. Explained that with Intermittent Leave the Forms ask for the estimated number of appointments and the estimated number of episodic flare-ups. Patient stated to just shred the paperwork and she would work it out because it was more stress than she needed at this time. Attempted to apologize again but Patient hung up the phone at this time. FMLA paperwork completed as stated above and forwarded to Provider for review.

## 2022-02-03 NOTE — Patient Instructions (Signed)
DUE TO SPACE LIMITATIONS, ONLY TWO VISITORS  (aged 45 and older) ARE ALLOWED TO COME WITH YOU AND STAY IN THE WAITING ROOM DURING YOUR PRE OP AND PROCEDURE.   **NO VISITORS ARE ALLOWED IN THE SHORT STAY AREA OR RECOVERY ROOM!!**  You are not required to quarantine at this time prior to your surgery. However, you must do this: Hand Hygiene often Do NOT share personal items Notify your provider if you are in close contact with someone who has COVID or you develop fever 100.4 or greater, new onset of sneezing, cough, sore throat, shortness of breath or body aches.       Your procedure is scheduled on:  Tuesday February 11, 2022  Report to [facility] Main Entrance.  Report to admitting at: 08:45 AM  +++++Call this number if you have any questions or problems the morning of surgery [phone]  FOLLOW BOWEL PREP AND ANY ADDITIONAL PRE OP INSTRUCTIONS YOU RECEIVED FROM YOUR SURGEON'S OFFICE!!!  Eat a light diet the day before surgery.  Examples including soups, broths, toast, yogurt, mashed potatoes.  AVOID GAS PRODUCING FOODS. Things to avoid include carbonated beverages (fizzy beverages, sodas), raw fruits and raw vegetables (uncooked), or beans.    Do not eat food :After Midnight the night prior to your surgery/procedure.  After Midnight you may have the following liquids until  08:00  AM  DAY OF SURGERY  Clear Liquid Diet Water Black Coffee (sugar ok, NO MILK/CREAM OR CREAMERS)  Tea (sugar ok, NO MILK/CREAM OR CREAMERS) regular and decaf                             Plain Jell-O (NO RED)                                           Fruit ices (not with fruit pulp, NO RED)                                     Popsicles (NO RED)                                                                  Juice: apple, WHITE grape, WHITE cranberry Sports drinks like Gatorade (NO RED)              Oral Hygiene is also important to reduce your risk of infection.        Remember - BRUSH YOUR  TEETH THE MORNING OF SURGERY WITH YOUR REGULAR TOOTHPASTE  Take ONLY these medicines the morning of surgery with A SIP OF WATER: sertaline (Zoloft), letrozole (Femara). IF needed you may take Tramadol, Buspirone (Buspar), Zofran and use your Albuterol inhaler.                 You may not have any metal on your body including hair pins, jewelry, and body piercing  Do not wear make-up, lotions, powders, perfumes, or deodorant  Do not wear nail polish including gel and S&S, artificial / acrylic nails, or any other type  of covering on natural nails including finger and toenails. If you have artificial nails, gel coating, etc., that needs to be removed by a nail salon, Please have this removed prior to surgery. Not doing so may mean that your surgery could be cancelled or delayed if the Surgeon or anesthesia staff feels like they are unable to monitor you safely.   Do not shave 48 hours prior to surgery to avoid nicks in your skin which may contribute to postoperative infections.   Contacts, Hearing Aids, dentures or bridgework may not be worn into surgery.   DO NOT [place]. PHARMACY WILL DISPENSE MEDICATIONS LISTED ON YOUR MEDICATION LIST TO YOU DURING YOUR ADMISSION [place]!   Patients discharged on the day of surgery will not be allowed to drive home.  Someone NEEDS to stay with you for the first 24 hours after anesthesia.  Please read over the following fact sheets you were given: IF YOU HAVE QUESTIONS ABOUT YOUR PRE-OP INSTRUCTIONS, PLEASE CALL [phone]  ([place])   [place] - Preparing for Surgery Before surgery, you can play an important role.  Because skin is not sterile, your skin needs to be as free of germs as possible.  You can reduce the number of germs on your skin by washing with CHG (chlorahexidine gluconate) soap before surgery.  CHG is an antiseptic cleaner which kills germs and bonds with the skin to continue killing germs even after  washing. Please DO NOT use if you have an allergy to CHG or antibacterial soaps.  If your skin becomes reddened/irritated stop using the CHG and inform your nurse when you arrive at Short Stay. Do not shave (including legs and underarms) for at least 48 hours prior to the first CHG shower.  You may shave your face/neck.  Please follow these instructions carefully:  1.  Shower with CHG Soap the night before surgery and the  morning of surgery.  2.  If you choose to wash your hair, wash your hair first as usual with your normal  shampoo.  3.  After you shampoo, rinse your hair and body thoroughly to remove the shampoo.                             4.  Use CHG as you would any other liquid soap.  You can apply chg directly to the skin and wash.  Gently with a scrungie or clean washcloth.  5.  Apply the CHG Soap to your body ONLY FROM THE NECK DOWN.   Do not use on face/ open                           Wound or open sores. Avoid contact with eyes, ears mouth and genitals (private parts).                       Wash face,  Genitals (private parts) with your normal soap.             6.  Wash thoroughly, paying special attention to the area where your  surgery  will be performed.  7.  Thoroughly rinse your body with warm water from the neck down.  8.  DO NOT shower/wash with your normal soap after using and rinsing off the CHG Soap.            9.  Pat yourself dry with a clean towel.            10.  Wear clean pajamas.            11.  Place clean sheets on your bed the night of your first shower and do not  sleep with pets.  ON THE DAY OF SURGERY : Do not apply any lotions/deodorants the morning of surgery.  Please wear clean clothes to the hospital/surgery center.    FAILURE TO FOLLOW THESE INSTRUCTIONS MAY RESULT IN THE CANCELLATION OF YOUR SURGERY  PATIENT SIGNATURE_________________________________  NURSE  SIGNATURE__________________________________  ________________________________________________________________________

## 2022-02-03 NOTE — Progress Notes (Signed)
COVID Vaccine received:  '[]'$  No '[x]'$  Yes Date of any COVID positive Test in last 90 days: none  PCP - Janie Morning, DO Cardiologist - none Oncology- Nicholas Lose, MD  PATIENT HAS RIGHT ARM RESTRICTED.   Chest x-ray - n/a EKG -  n/a Stress Test - n/a ECHO - n/a Cardiac Cath - n/a  Pacemaker/ICD device     '[x]'$  N/A Spinal Cord Stimulator:'[x]'$  No '[]'$  Yes      (Other Implants:   Bowel Prep - Light diet the day before surgery, No gas producing food.   History of Sleep Apnea? '[x]'$  No '[]'$  Yes   Sleep Study Date:   CPAP used?- '[x]'$  No '[]'$  Yes  (Instruct to bring their mask & Tubing)  Does the patient monitor blood sugar? '[]'$  No '[]'$  Yes  '[x]'$  N/A  Blood Thinner Instructions:  None Aspirin Instructions: None Last Dose:  ERAS Protocol Ordered: '[]'$  No  '[x]'$  Yes  no drink ordered PRE-SURGERY '[]'$  ENSURE  '[]'$  G2   Comments: Bilateral mastectomy with bone/ liver metastasis  Activity level: Patient can climb a flight of stairs without difficulty;  '[x]'$  No CP  '[x]'$  No SOB  Anesthesia review: Karlene Lineman   Patient denies shortness of breath, fever, cough and chest pain at PAT appointment.  Patient verbalized understanding and agreement to the Pre-Surgical Instructions that were given to them at this PAT appointment. Patient was also educated of the need to review these PAT instructions again prior to his/her surgery.I reviewed the appropriate phone numbers to call if they have any and questions or concerns.

## 2022-02-05 ENCOUNTER — Encounter (HOSPITAL_COMMUNITY)
Admission: RE | Admit: 2022-02-05 | Discharge: 2022-02-05 | Disposition: A | Discharge status: Home / Self Care | Payer: 59 | Source: Ambulatory Visit | Attending: Gynecologic Oncology | Admitting: Gynecologic Oncology

## 2022-02-05 ENCOUNTER — Encounter (HOSPITAL_COMMUNITY): Payer: Self-pay

## 2022-02-05 ENCOUNTER — Other Ambulatory Visit: Payer: Self-pay

## 2022-02-05 DIAGNOSIS — C50911 Malignant neoplasm of unspecified site of right female breast: Secondary | ICD-10-CM | POA: Diagnosis not present

## 2022-02-05 DIAGNOSIS — C7951 Secondary malignant neoplasm of bone: Secondary | ICD-10-CM | POA: Insufficient documentation

## 2022-02-05 DIAGNOSIS — Z01812 Encounter for preprocedural laboratory examination: Secondary | ICD-10-CM | POA: Diagnosis present

## 2022-02-05 HISTORY — DX: Unspecified osteoarthritis, unspecified site: M19.90

## 2022-02-05 HISTORY — DX: Gastro-esophageal reflux disease without esophagitis: K21.9

## 2022-02-05 LAB — COMPREHENSIVE METABOLIC PANEL
ALT: 24 U/L (ref 0–44)
AST: 22 U/L (ref 15–41)
Albumin: 3.6 g/dL (ref 3.5–5.0)
Alkaline Phosphatase: 58 U/L (ref 38–126)
Anion gap: 6 (ref 5–15)
BUN: 14 mg/dL (ref 6–20)
CO2: 25 mmol/L (ref 22–32)
Calcium: 8.9 mg/dL (ref 8.9–10.3)
Chloride: 108 mmol/L (ref 98–111)
Creatinine, Ser: 0.72 mg/dL (ref 0.44–1.00)
GFR, Estimated: >60 mL/min (ref >60)
Glucose, Bld: 103 mg/dL — ABNORMAL HIGH (ref 70–99)
Potassium: 4.2 mmol/L (ref 3.5–5.1)
Sodium: 139 mmol/L (ref 135–145)
Total Bilirubin: 0.7 mg/dL (ref 0.3–1.2)
Total Protein: 7.3 g/dL (ref 6.5–8.1)

## 2022-02-05 LAB — CBC WITH DIFFERENTIAL/PLATELET
Abs Immature Granulocytes: 0.03 10*3/uL (ref 0.00–0.07)
Basophils Absolute: 0 10*3/uL (ref 0.0–0.1)
Basophils Relative: 1 %
Eosinophils Absolute: 0.2 10*3/uL (ref 0.0–0.5)
Eosinophils Relative: 4 %
HCT: 37.8 % (ref 36.0–46.0)
Hemoglobin: 13.3 g/dL (ref 12.0–15.0)
Immature Granulocytes: 1 %
Lymphocytes Relative: 20 %
Lymphs Abs: 0.7 10*3/uL (ref 0.7–4.0)
MCH: 32.5 pg (ref 26.0–34.0)
MCHC: 35.2 g/dL (ref 30.0–36.0)
MCV: 92.4 fL (ref 80.0–100.0)
Monocytes Absolute: 0.4 10*3/uL (ref 0.1–1.0)
Monocytes Relative: 10 %
Neutro Abs: 2.4 10*3/uL (ref 1.7–7.7)
Neutrophils Relative %: 64 %
Platelets: 236 10*3/uL (ref 150–400)
RBC: 4.09 MIL/uL (ref 3.87–5.11)
RDW: 13 % (ref 11.5–15.5)
WBC: 3.7 10*3/uL — ABNORMAL LOW (ref 4.0–10.5)
nRBC: 0 % (ref 0.0–0.2)

## 2022-02-07 ENCOUNTER — Ambulatory Visit
Admission: RE | Admit: 2022-02-07 | Discharge: 2022-02-07 | Disposition: A | Discharge status: Home / Self Care | Payer: 59 | Source: Ambulatory Visit | Attending: Specialist | Admitting: Specialist

## 2022-02-07 ENCOUNTER — Telehealth: Payer: Self-pay

## 2022-02-07 DIAGNOSIS — C7951 Secondary malignant neoplasm of bone: Secondary | ICD-10-CM

## 2022-02-07 DIAGNOSIS — R2241 Localized swelling, mass and lump, right lower limb: Secondary | ICD-10-CM

## 2022-02-07 DIAGNOSIS — M8448XA Pathological fracture, other site, initial encounter for fracture: Secondary | ICD-10-CM

## 2022-02-07 NOTE — Telephone Encounter (Signed)
LVM for patient to call back regarding pre-op phone call/instructions.

## 2022-02-07 NOTE — Telephone Encounter (Signed)
Telephone call to check on pre-operative status.  Patient compliant with pre-operative instructions.  Reinforced nothing to eat after midnight. Clear liquids until 0800. Patient to arrive at [place]. Told Linda Gutierrez that Dr. Lindi Adie said to stop her Letrozole 3 days prior to surgery 02-11-22. Last dose would be today 02-07-22 He said that she can resume her Verzenio 1 week after surgery. Pt verbalized understanding.  No questions or concerns voiced.  Instructed to call for any needs.

## 2022-02-10 ENCOUNTER — Encounter (HOSPITAL_COMMUNITY): Payer: Self-pay | Admitting: Gynecologic Oncology

## 2022-02-10 NOTE — Anesthesia Preprocedure Evaluation (Addendum)
Anesthesia Evaluation  Patient identified by MRN, date of birth, ID band Patient awake    Reviewed: Allergy & Precautions, NPO status , Patient's Chart, lab work & pertinent test results  Airway Mallampati: II  TM Distance: >3 FB Neck ROM: Full    Dental  (+) Chipped, Caps, Dental Advisory Given Permanent Bridge LUOQ:   Pulmonary asthma , pneumonia, resolved, former smoker,    Pulmonary exam normal breath sounds clear to auscultation       Cardiovascular negative cardio ROS Normal cardiovascular exam Rhythm:Regular Rate:Normal     Neuro/Psych PSYCHIATRIC DISORDERS Depression negative neurological ROS     GI/Hepatic hiatal hernia, GERD  Medicated,Probable metastatic liver disease   Endo/Other  Obesity 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, lungs and bone S/P right mastectomy S/P breast reconstruction  Renal/GU negative Renal ROS Bladder dysfunction  Urinary incontinence    Musculoskeletal  (+) Arthritis , Osteoarthritis,  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   Abdominal (+) + obese,   Peds  Hematology negative hematology ROS (+)   Anesthesia Other Findings   Reproductive/Obstetrics Hx/o HPV                            Anesthesia Physical Anesthesia Plan  ASA: 2  Anesthesia Plan: General   Post-op Pain Management: Dilaudid IV, Gabapentin PO (pre-op)* and Tylenol PO (pre-op)*   Induction: Intravenous  PONV Risk Score and Plan: 4 or greater and Treatment may vary due to age or medical condition, Midazolam, Dexamethasone, Ondansetron and Scopolamine patch - Pre-op  Airway Management Planned: Oral ETT  Additional Equipment: None  Intra-op Plan:   Post-operative Plan: Extubation in OR  Informed Consent: I have reviewed the patients History and Physical, chart, labs and discussed the procedure including the risks, benefits and alternatives for the proposed  anesthesia with the patient or authorized representative who has indicated his/her understanding and acceptance.     Dental advisory given  Plan Discussed with: CRNA and Anesthesiologist  Anesthesia Plan Comments:        Anesthesia Quick Evaluation

## 2022-02-11 ENCOUNTER — Ambulatory Visit (HOSPITAL_COMMUNITY)
Admission: RE | Admit: 2022-02-11 | Discharge: 2022-02-11 | Disposition: A | Discharge status: Home / Self Care | Payer: 59 | Source: Ambulatory Visit | Attending: Gynecologic Oncology | Admitting: Gynecologic Oncology

## 2022-02-11 ENCOUNTER — Other Ambulatory Visit: Payer: Self-pay

## 2022-02-11 ENCOUNTER — Ambulatory Visit (HOSPITAL_COMMUNITY): Payer: 59 | Admitting: Anesthesiology

## 2022-02-11 ENCOUNTER — Ambulatory Visit (HOSPITAL_BASED_OUTPATIENT_CLINIC_OR_DEPARTMENT_OTHER): Payer: 59 | Admitting: Anesthesiology

## 2022-02-11 ENCOUNTER — Encounter: Payer: Self-pay | Admitting: Hematology and Oncology

## 2022-02-11 ENCOUNTER — Encounter (HOSPITAL_COMMUNITY): Payer: Self-pay | Admitting: Gynecologic Oncology

## 2022-02-11 ENCOUNTER — Encounter (HOSPITAL_COMMUNITY)
Admission: RE | Disposition: A | Discharge status: Home / Self Care | Payer: Self-pay | Source: Ambulatory Visit | Attending: Gynecologic Oncology

## 2022-02-11 DIAGNOSIS — C7800 Secondary malignant neoplasm of unspecified lung: Secondary | ICD-10-CM | POA: Insufficient documentation

## 2022-02-11 DIAGNOSIS — D4959 Neoplasm of unspecified behavior of other genitourinary organ: Secondary | ICD-10-CM | POA: Diagnosis not present

## 2022-02-11 DIAGNOSIS — Z4002 Encounter for prophylactic removal of ovary: Secondary | ICD-10-CM | POA: Diagnosis not present

## 2022-02-11 DIAGNOSIS — Z79899 Other long term (current) drug therapy: Secondary | ICD-10-CM | POA: Insufficient documentation

## 2022-02-11 DIAGNOSIS — Z87891 Personal history of nicotine dependence: Secondary | ICD-10-CM

## 2022-02-11 DIAGNOSIS — C7951 Secondary malignant neoplasm of bone: Secondary | ICD-10-CM | POA: Insufficient documentation

## 2022-02-11 DIAGNOSIS — Z170 Estrogen receptor positive status [ER+]: Secondary | ICD-10-CM | POA: Diagnosis not present

## 2022-02-11 DIAGNOSIS — C50911 Malignant neoplasm of unspecified site of right female breast: Secondary | ICD-10-CM | POA: Diagnosis not present

## 2022-02-11 DIAGNOSIS — J45909 Unspecified asthma, uncomplicated: Secondary | ICD-10-CM | POA: Diagnosis not present

## 2022-02-11 DIAGNOSIS — C787 Secondary malignant neoplasm of liver and intrahepatic bile duct: Secondary | ICD-10-CM | POA: Insufficient documentation

## 2022-02-11 DIAGNOSIS — Z90710 Acquired absence of both cervix and uterus: Secondary | ICD-10-CM | POA: Diagnosis not present

## 2022-02-11 DIAGNOSIS — Z1721 Progesterone receptor positive status: Secondary | ICD-10-CM

## 2022-02-11 DIAGNOSIS — C50919 Malignant neoplasm of unspecified site of unspecified female breast: Secondary | ICD-10-CM

## 2022-02-11 DIAGNOSIS — Z79818 Long term (current) use of other agents affecting estrogen receptors and estrogen levels: Secondary | ICD-10-CM | POA: Diagnosis not present

## 2022-02-11 DIAGNOSIS — Z9221 Personal history of antineoplastic chemotherapy: Secondary | ICD-10-CM | POA: Insufficient documentation

## 2022-02-11 DIAGNOSIS — Z1501 Genetic susceptibility to malignant neoplasm of breast: Secondary | ICD-10-CM | POA: Insufficient documentation

## 2022-02-11 DIAGNOSIS — Z9013 Acquired absence of bilateral breasts and nipples: Secondary | ICD-10-CM | POA: Diagnosis not present

## 2022-02-11 HISTORY — PX: ROBOTIC ASSISTED BILATERAL SALPINGO OOPHERECTOMY: SHX6078

## 2022-02-11 LAB — ABO/RH: ABO/RH(D): B POS

## 2022-02-11 LAB — TYPE AND SCREEN
ABO/RH(D): B POS
Antibody Screen: NEGATIVE

## 2022-02-11 SURGERY — SALPINGO-OOPHORECTOMY, BILATERAL, ROBOT-ASSISTED
Anesthesia: General | Laterality: Bilateral

## 2022-02-11 MED ORDER — GABAPENTIN 300 MG PO CAPS
300.0000 mg | ORAL_CAPSULE | ORAL | Status: AC
  Administered 2022-02-11: 300 mg via ORAL
  Filled 2022-02-11: qty 1

## 2022-02-11 MED ORDER — LIDOCAINE HCL (PF) 2 % IJ SOLN
INTRAMUSCULAR | Status: AC
  Filled 2022-02-11: qty 25

## 2022-02-11 MED ORDER — OXYCODONE HCL 5 MG PO TABS
5.0000 mg | ORAL_TABLET | Freq: Once | ORAL | Status: AC | PRN
  Administered 2022-02-11: 5 mg via ORAL

## 2022-02-11 MED ORDER — DEXAMETHASONE SODIUM PHOSPHATE 10 MG/ML IJ SOLN
INTRAMUSCULAR | Status: AC
  Filled 2022-02-11: qty 1

## 2022-02-11 MED ORDER — HEPARIN SODIUM (PORCINE) 5000 UNIT/ML IJ SOLN
5000.0000 [IU] | INTRAMUSCULAR | Status: AC
  Administered 2022-02-11: 5000 [IU] via SUBCUTANEOUS
  Filled 2022-02-11: qty 1

## 2022-02-11 MED ORDER — STERILE WATER FOR IRRIGATION IR SOLN
Status: DC | PRN
  Administered 2022-02-11: 1000 mL

## 2022-02-11 MED ORDER — KETAMINE HCL 10 MG/ML IJ SOLN
INTRAMUSCULAR | Status: DC | PRN
  Administered 2022-02-11: 30 mg via INTRAVENOUS
  Administered 2022-02-11: 10 mg via INTRAVENOUS

## 2022-02-11 MED ORDER — LIDOCAINE 2% (20 MG/ML) 5 ML SYRINGE
INTRAMUSCULAR | Status: DC | PRN
  Administered 2022-02-11: 1 mg/kg/h via INTRAVENOUS

## 2022-02-11 MED ORDER — LABETALOL HCL 5 MG/ML IV SOLN
INTRAVENOUS | Status: DC | PRN
  Administered 2022-02-11 (×2): 5 mg via INTRAVENOUS

## 2022-02-11 MED ORDER — FENTANYL CITRATE (PF) 100 MCG/2ML IJ SOLN
INTRAMUSCULAR | Status: DC | PRN
  Administered 2022-02-11: 100 ug via INTRAVENOUS

## 2022-02-11 MED ORDER — ONDANSETRON HCL 4 MG/2ML IJ SOLN
INTRAMUSCULAR | Status: DC | PRN
  Administered 2022-02-11: 4 mg via INTRAVENOUS

## 2022-02-11 MED ORDER — ESMOLOL HCL 100 MG/10ML IV SOLN
INTRAVENOUS | Status: AC
  Filled 2022-02-11: qty 10

## 2022-02-11 MED ORDER — ROCURONIUM BROMIDE 10 MG/ML (PF) SYRINGE
PREFILLED_SYRINGE | INTRAVENOUS | Status: AC
  Filled 2022-02-11: qty 10

## 2022-02-11 MED ORDER — MIDAZOLAM HCL 2 MG/2ML IJ SOLN
INTRAMUSCULAR | Status: AC
  Filled 2022-02-11: qty 2

## 2022-02-11 MED ORDER — CHLORHEXIDINE GLUCONATE CLOTH 2 % EX PADS: 6.0000 | MEDICATED_PAD | Freq: Once | CUTANEOUS | Status: DC

## 2022-02-11 MED ORDER — ONDANSETRON HCL 4 MG/2ML IJ SOLN
INTRAMUSCULAR | Status: AC
  Filled 2022-02-11: qty 2

## 2022-02-11 MED ORDER — LACTATED RINGERS IR SOLN
Status: DC | PRN
  Administered 2022-02-11: 1000 mL

## 2022-02-11 MED ORDER — ORAL CARE MOUTH RINSE: 15.0000 mL | Freq: Once | OROMUCOSAL | Status: AC

## 2022-02-11 MED ORDER — SUGAMMADEX SODIUM 200 MG/2ML IV SOLN
INTRAVENOUS | Status: DC | PRN
  Administered 2022-02-11: 200 mg via INTRAVENOUS

## 2022-02-11 MED ORDER — ACETAMINOPHEN 500 MG PO TABS: 1000.0000 mg | ORAL_TABLET | ORAL | Status: DC

## 2022-02-11 MED ORDER — ACETAMINOPHEN 500 MG PO TABS
1000.0000 mg | ORAL_TABLET | ORAL | Status: AC
  Administered 2022-02-11: 1000 mg via ORAL
  Filled 2022-02-11: qty 2

## 2022-02-11 MED ORDER — KETOROLAC TROMETHAMINE 15 MG/ML IJ SOLN: 15.0000 mg | INTRAMUSCULAR | Status: DC

## 2022-02-11 MED ORDER — HYDROMORPHONE HCL 1 MG/ML IJ SOLN
0.2500 mg | INTRAMUSCULAR | Status: DC | PRN
  Administered 2022-02-11 (×2): 0.25 mg via INTRAVENOUS
  Administered 2022-02-11: 0.5 mg via INTRAVENOUS

## 2022-02-11 MED ORDER — ONDANSETRON HCL 4 MG/2ML IJ SOLN: 4.0000 mg | Freq: Once | INTRAMUSCULAR | Status: DC | PRN

## 2022-02-11 MED ORDER — OXYCODONE HCL 5 MG PO TABS
ORAL_TABLET | ORAL | Status: AC
  Filled 2022-02-11: qty 1

## 2022-02-11 MED ORDER — PROPOFOL 10 MG/ML IV BOLUS
INTRAVENOUS | Status: AC
  Filled 2022-02-11: qty 20

## 2022-02-11 MED ORDER — FENTANYL CITRATE (PF) 100 MCG/2ML IJ SOLN
INTRAMUSCULAR | Status: AC
  Filled 2022-02-11: qty 2

## 2022-02-11 MED ORDER — AMISULPRIDE (ANTIEMETIC) 5 MG/2ML IV SOLN
10.0000 mg | Freq: Once | INTRAVENOUS | Status: DC | PRN
Start: 2022-02-11 — End: 2022-02-11

## 2022-02-11 MED ORDER — HYDROMORPHONE HCL 1 MG/ML IJ SOLN
INTRAMUSCULAR | Status: AC
  Filled 2022-02-11: qty 1

## 2022-02-11 MED ORDER — KETAMINE HCL 50 MG/5ML IJ SOSY
PREFILLED_SYRINGE | INTRAMUSCULAR | Status: AC
  Filled 2022-02-11: qty 5

## 2022-02-11 MED ORDER — ROCURONIUM BROMIDE 10 MG/ML (PF) SYRINGE
PREFILLED_SYRINGE | INTRAVENOUS | Status: DC | PRN
  Administered 2022-02-11: 70 mg via INTRAVENOUS

## 2022-02-11 MED ORDER — SCOPOLAMINE 1 MG/3DAYS TD PT72
1.0000 | MEDICATED_PATCH | TRANSDERMAL | Status: DC
  Administered 2022-02-11: 1.5 mg via TRANSDERMAL
  Filled 2022-02-11: qty 1

## 2022-02-11 MED ORDER — BUPIVACAINE HCL 0.25 % IJ SOLN
INTRAMUSCULAR | Status: DC | PRN
  Administered 2022-02-11: 50 mL

## 2022-02-11 MED ORDER — LABETALOL HCL 5 MG/ML IV SOLN
INTRAVENOUS | Status: AC
  Filled 2022-02-11: qty 4

## 2022-02-11 MED ORDER — CEFAZOLIN SODIUM-DEXTROSE 2-4 GM/100ML-% IV SOLN
2.0000 g | INTRAVENOUS | Status: AC
  Administered 2022-02-11: 2 g via INTRAVENOUS
  Filled 2022-02-11: qty 100

## 2022-02-11 MED ORDER — DEXAMETHASONE SODIUM PHOSPHATE 4 MG/ML IJ SOLN: 4.0000 mg | INTRAMUSCULAR | Status: DC

## 2022-02-11 MED ORDER — DEXAMETHASONE SODIUM PHOSPHATE 10 MG/ML IJ SOLN
INTRAMUSCULAR | Status: DC | PRN
  Administered 2022-02-11: 10 mg via INTRAVENOUS

## 2022-02-11 MED ORDER — CHLORHEXIDINE GLUCONATE 0.12 % MT SOLN
15.0000 mL | Freq: Once | OROMUCOSAL | Status: AC
Start: 2022-02-11 — End: 2022-02-11
  Administered 2022-02-11: 15 mL via OROMUCOSAL

## 2022-02-11 MED ORDER — PROPOFOL 10 MG/ML IV BOLUS
INTRAVENOUS | Status: DC | PRN
  Administered 2022-02-11: 170 mg via INTRAVENOUS
  Administered 2022-02-11: 30 mg via INTRAVENOUS

## 2022-02-11 MED ORDER — ESMOLOL HCL 100 MG/10ML IV SOLN
INTRAVENOUS | Status: DC | PRN
  Administered 2022-02-11 (×2): 30 mg via INTRAVENOUS

## 2022-02-11 MED ORDER — LACTATED RINGERS IV SOLN: INTRAVENOUS | Status: DC

## 2022-02-11 MED ORDER — MIDAZOLAM HCL 5 MG/5ML IJ SOLN
INTRAMUSCULAR | Status: DC | PRN
  Administered 2022-02-11: 2 mg via INTRAVENOUS

## 2022-02-11 MED ORDER — PHENYLEPHRINE 80 MCG/ML (10ML) SYRINGE FOR IV PUSH (FOR BLOOD PRESSURE SUPPORT)
PREFILLED_SYRINGE | INTRAVENOUS | Status: AC
  Filled 2022-02-11: qty 10

## 2022-02-11 MED ORDER — BUPIVACAINE HCL 0.25 % IJ SOLN
INTRAMUSCULAR | Status: AC
  Filled 2022-02-11: qty 1

## 2022-02-11 MED ORDER — OXYCODONE HCL 5 MG/5ML PO SOLN: 5.0000 mg | Freq: Once | ORAL | Status: AC | PRN

## 2022-02-11 SURGICAL SUPPLY — 74 items
ADH SKN CLS APL DERMABOND .7 (GAUZE/BANDAGES/DRESSINGS) ×1
AGENT HMST KT MTR STRL THRMB (HEMOSTASIS)
APL ESCP 34 STRL LF DISP (HEMOSTASIS)
APPLICATOR SURGIFLO ENDO (HEMOSTASIS) IMPLANT
BAG LAPAROSCOPIC 12 15 PORT 16 (BASKET) IMPLANT
BAG RETRIEVAL 12/15 (BASKET)
BLADE SURG SZ10 CARB STEEL (BLADE) IMPLANT
COVER BACK TABLE 60X90IN (DRAPES) ×1 IMPLANT
COVER TIP SHEARS 8 DVNC (MISCELLANEOUS) ×1 IMPLANT
COVER TIP SHEARS 8MM DA VINCI (MISCELLANEOUS) ×1
DERMABOND ADVANCED (GAUZE/BANDAGES/DRESSINGS) ×1
DERMABOND ADVANCED .7 DNX12 (GAUZE/BANDAGES/DRESSINGS) ×1 IMPLANT
DRAPE ARM DVNC X/XI (DISPOSABLE) ×4 IMPLANT
DRAPE COLUMN DVNC XI (DISPOSABLE) ×1 IMPLANT
DRAPE DA VINCI XI ARM (DISPOSABLE) ×4
DRAPE DA VINCI XI COLUMN (DISPOSABLE) ×1
DRAPE SHEET LG 3/4 BI-LAMINATE (DRAPES) ×1 IMPLANT
DRAPE SURG IRRIG POUCH 19X23 (DRAPES) ×1 IMPLANT
DRSG OPSITE POSTOP 4X6 (GAUZE/BANDAGES/DRESSINGS) IMPLANT
DRSG OPSITE POSTOP 4X8 (GAUZE/BANDAGES/DRESSINGS) IMPLANT
ELECT PENCIL ROCKER SW 15FT (MISCELLANEOUS) IMPLANT
ELECT REM PT RETURN 15FT ADLT (MISCELLANEOUS) ×1 IMPLANT
GAUZE 4X4 16PLY ~~LOC~~+RFID DBL (SPONGE) ×2 IMPLANT
GLOVE BIO SURGEON STRL SZ 6 (GLOVE) ×4 IMPLANT
GLOVE BIO SURGEON STRL SZ 6.5 (GLOVE) ×1 IMPLANT
GOWN STRL REUS W/ TWL LRG LVL3 (GOWN DISPOSABLE) ×4 IMPLANT
GOWN STRL REUS W/TWL LRG LVL3 (GOWN DISPOSABLE) ×4
GRASPER SUT TROCAR 14GX15 (MISCELLANEOUS) IMPLANT
HOLDER FOLEY CATH W/STRAP (MISCELLANEOUS) IMPLANT
IRRIG SUCT STRYKERFLOW 2 WTIP (MISCELLANEOUS) ×1
IRRIGATION SUCT STRKRFLW 2 WTP (MISCELLANEOUS) ×1 IMPLANT
KIT PROCEDURE DA VINCI SI (MISCELLANEOUS)
KIT PROCEDURE DVNC SI (MISCELLANEOUS) IMPLANT
KIT TURNOVER KIT A (KITS) IMPLANT
LIGASURE IMPACT 36 18CM CVD LR (INSTRUMENTS) IMPLANT
MANIPULATOR ADVINCU DEL 3.0 PL (MISCELLANEOUS) IMPLANT
MANIPULATOR ADVINCU DEL 3.5 PL (MISCELLANEOUS) IMPLANT
MANIPULATOR UTERINE 4.5 ZUMI (MISCELLANEOUS) IMPLANT
NDL HYPO 21X1.5 SAFETY (NEEDLE) ×1 IMPLANT
NDL SPNL 18GX3.5 QUINCKE PK (NEEDLE) IMPLANT
NEEDLE HYPO 21X1.5 SAFETY (NEEDLE) ×1 IMPLANT
NEEDLE SPNL 18GX3.5 QUINCKE PK (NEEDLE) IMPLANT
OBTURATOR OPTICAL STANDARD 8MM (TROCAR) ×1
OBTURATOR OPTICAL STND 8 DVNC (TROCAR) ×1
OBTURATOR OPTICALSTD 8 DVNC (TROCAR) ×1 IMPLANT
PACK ROBOT GYN CUSTOM WL (TRAY / TRAY PROCEDURE) ×1 IMPLANT
PAD POSITIONING PINK XL (MISCELLANEOUS) ×1 IMPLANT
PORT ACCESS TROCAR AIRSEAL 12 (TROCAR) ×1 IMPLANT
PORT ACCESS TROCAR AIRSEAL 5M (TROCAR) ×1
SCRUB CHG 4% DYNA-HEX 4OZ (MISCELLANEOUS) ×2 IMPLANT
SEAL CANN UNIV 5-8 DVNC XI (MISCELLANEOUS) ×4 IMPLANT
SEAL XI 5MM-8MM UNIVERSAL (MISCELLANEOUS) ×4
SET TRI-LUMEN FLTR TB AIRSEAL (TUBING) ×1 IMPLANT
SPIKE FLUID TRANSFER (MISCELLANEOUS) ×1 IMPLANT
SPONGE T-LAP 18X18 ~~LOC~~+RFID (SPONGE) IMPLANT
SURGIFLO W/THROMBIN 8M KIT (HEMOSTASIS) IMPLANT
SUT MNCRL AB 4-0 PS2 18 (SUTURE) IMPLANT
SUT PDS AB 1 TP1 96 (SUTURE) IMPLANT
SUT VIC AB 0 CT1 27 (SUTURE)
SUT VIC AB 0 CT1 27XBRD ANTBC (SUTURE) IMPLANT
SUT VIC AB 2-0 CT1 27 (SUTURE)
SUT VIC AB 2-0 CT1 TAPERPNT 27 (SUTURE) IMPLANT
SUT VIC AB 4-0 PS2 18 (SUTURE) ×2 IMPLANT
SYR 10ML LL (SYRINGE) IMPLANT
SYS BAG RETRIEVAL 10MM (BASKET) ×1
SYS WOUND ALEXIS 18CM MED (MISCELLANEOUS)
SYSTEM BAG RETRIEVAL 10MM (BASKET) IMPLANT
SYSTEM WOUND ALEXIS 18CM MED (MISCELLANEOUS) IMPLANT
TOWEL OR NON WOVEN STRL DISP B (DISPOSABLE) IMPLANT
TRAP SPECIMEN MUCUS 40CC (MISCELLANEOUS) IMPLANT
TRAY FOLEY MTR SLVR 16FR STAT (SET/KITS/TRAYS/PACK) ×1 IMPLANT
UNDERPAD 30X36 HEAVY ABSORB (UNDERPADS AND DIAPERS) ×2 IMPLANT
WATER STERILE IRR 1000ML POUR (IV SOLUTION) ×1 IMPLANT
YANKAUER SUCT BULB TIP 10FT TU (MISCELLANEOUS) IMPLANT

## 2022-02-11 NOTE — Anesthesia Postprocedure Evaluation (Signed)
Anesthesia Post Note  Patient: Linda Gutierrez  Procedure(s) Performed: XI ROBOTIC ASSISTED BILATERAL SALPINGO OOPHORECTOMY (Bilateral)     Patient location during evaluation: PACU Anesthesia Type: General Level of consciousness: awake and alert and oriented Pain management: pain level controlled Vital Signs Assessment: post-procedure vital signs reviewed and stable Respiratory status: spontaneous breathing, nonlabored ventilation and respiratory function stable Cardiovascular status: blood pressure returned to baseline and stable Postop Assessment: no apparent nausea or vomiting Anesthetic complications: no   No notable events documented.  Last Vitals:  Vitals:   02/11/22 1230 02/11/22 1245  BP: (!) 158/93 (!) 157/91  Pulse: 71 74  Resp: (!) 21 18  Temp:    SpO2: 100% 93%    Last Pain:  Vitals:   02/11/22 1255  TempSrc:   PainSc: 7                  [name]

## 2022-02-11 NOTE — Op Note (Signed)
OPERATIVE NOTE  Pre-operative Diagnosis: ER+ metastatic breast cancer  Post-operative Diagnosis: same  Operation: Robotic-assisted laparoscopic bilateral salpingoophorectomy   Surgeon: Jeral Pinch MD  Assistant Surgeon: Lahoma Crocker MD (an MD assistant was necessary for tissue manipulation, management of robotic instrumentation, retraction and positioning due to the complexity of the case and hospital policies).   Anesthesia: GET  Urine Output: 75  Operative Findings: On EUA, uterus and cervix surgically absent. ON intra-abdominal entry, normal upper abdominal survey. Normal appearing omentum, small and large bowel. Surgically absent uterus. Normal appearing fallopian tubes and ovaries. No ascites. Minimal adhesions within the pelvis from prior hysterectomy.   Estimated Blood Loss:  25 cc      Total IV Fluids: see I&O flowsheet         Specimens: bilateral tubes and ovaries         Complications:  None apparent; patient tolerated the procedure well.         Disposition: PACU - hemodynamically stable.  Procedure Details  The patient was seen in the Holding Room. The risks, benefits, complications, treatment options, and expected outcomes were discussed with the patient.  The patient concurred with the proposed plan, giving informed consent.  The site of surgery properly noted/marked. The patient was identified as Linda Gutierrez and the procedure verified as a Robotic-assisted bilateral salpingo-oophorectomy with any other indicated procedures.   After induction of anesthesia, the patient was draped and prepped in the usual sterile manner. Patient was placed in supine position after anesthesia and draped and prepped in the usual sterile manner as follows: Her arms were tucked to her side with all appropriate precautions.  The shoulders were stabilized with padded shoulder blocks applied to the acromium processes.  The patient was placed in the semi-lithotomy position in  [place].  The perineum and vagina were prepped with CholoraPrep. The patient was draped after the CholoraPrep had been allowed to dry for 3 minutes.  A Time Out was held and the above information confirmed.  The urethra was prepped with Betadine. Foley catheter was placed.  OG tube placement was confirmed and to suction.   Next, a 10 mm skin incision was made 1 cm below the subcostal margin in the midclavicular line.  The 5 mm Optiview port and scope was used for direct entry.  Opening pressure was under 10 mm CO2.  The abdomen was insufflated and the findings were noted as above.   At this point and all points during the procedure, the patient's intra-abdominal pressure did not exceed 15 mmHg. Next, an 8 mm skin incision was made inferior to the umbilicus and a right and left port were placed about 8 cm lateral to the robot port on the right and left side.  The 5 mm assist trocar was exchanged for a 10-12 mm port. All ports were placed under direct visualization.  The patient was placed in steep Trendelenburg.  Bowel was noted to already be in the upper abdomen.  The robot was docked in the normal manner.  The right and left peritoneum were opened parallel to the IP ligament to open the retroperitoneal spaces bilaterally. The ureter was noted to be on the medial leaf of the broad ligament.  The peritoneum above the ureter was incised and stretched and the infundibulopelvic ligament was skeletonized, cauterized and cut.  The adnexa was freed from peritoneal attachments to the pelvic sidewalls after prior hysterectomy. Once freed, Adnexa were placed in an Endocatch bag and removed through the assist  trocar.   Irrigation was used and excellent hemostasis was achieved.  At this point in the procedure was completed.  Robotic instruments were removed under direct visulaization.  The robot was undocked. The fascia at the 10-12 mm port was closed with 0 Vicryl using a PMI fascial closure device under direct  visualization. The abdomen was desufflated.  The subcuticular tissue was closed with 4-0 Vicryl and the skin was closed with 4-0 Monocryl in a subcuticular manner.  Dermabond was applied.    The vagina was swabbed with minimal bleeding noted. Foley catheter was removed.  All sponge, lap and needle counts were correct x  3.   The patient was transferred to the recovery room in stable condition.  Jeral Pinch, MD

## 2022-02-11 NOTE — Progress Notes (Signed)
Dr. Royce Macadamia notified that 2nd IV site unsuccessful for Robot Case.  Patient has #18 to the left hand. She is restricted on her right side.  Requested 2nd iv be started in the OR, Dr.Foster states ok.

## 2022-02-11 NOTE — Discharge Instructions (Addendum)
AFTER SURGERY INSTRUCTIONS   Return to work: 4-6 weeks if applicable  Dr. Lindi Adie said you can resume your letrozole 3 days after surgery. You can also resume your Verzenio one week after surgery.    Activity: 1. Be up and out of the bed during the day.  Take a nap if needed.  You may walk up steps but be careful and use the hand rail.  Stair climbing will tire you more than you think, you may need to stop part way and rest.    2. No lifting or straining for 6 weeks over 10 pounds. No pushing, pulling, straining for 6 weeks.   3. No driving for around 1 week(s).  Do not drive if you are taking narcotic pain medicine and make sure that your reaction time has returned.    4. You can shower as soon as the next day after surgery. Shower daily.  Use your regular soap and water (not directly on the incision) and pat your incision(s) dry afterwards; don't rub.  No tub baths or submerging your body in water until cleared by your surgeon. If you have the soap that was given to you by pre-surgical testing that was used before surgery, you do not need to use it afterwards because this can irritate your incisions.    5. No sexual activity and nothing in the vagina for 4 weeks.   6. You may experience a small amount of clear drainage from your incisions, which is normal.  If the drainage persists, increases, or changes color please call the office.   7. Do not use creams, lotions, or ointments such as neosporin on your incisions after surgery until advised by your surgeon because they can cause removal of the dermabond glue on your incisions.     8. You may experience vaginal spotting after surgery.  The spotting is normal but if you experience heavy bleeding, call our office.   9. Take Tylenol or ibuprofen first for pain if you are able to take these medications and only use narcotic pain medication for severe pain not relieved by the Tylenol or Ibuprofen.  Monitor your Tylenol intake to a max of 4,000 mg  in a 24 hour period. You can alternate these medications after surgery.   Diet: 1. Low sodium Heart Healthy Diet is recommended but you are cleared to resume your normal (before surgery) diet after your procedure.   2. It is safe to use a laxative, such as Miralax or Colace, if you have difficulty moving your bowels.    Wound Care: 1. Keep clean and dry.  Shower daily.   Reasons to call the Doctor: Fever - Oral temperature greater than 100.4 degrees Fahrenheit Foul-smelling vaginal discharge Difficulty urinating Nausea and vomiting Increased pain at the site of the incision that is unrelieved with pain medicine. Difficulty breathing with or without chest pain New calf pain especially if only on one side Sudden, continuing increased vaginal bleeding with or without clots.   Contacts: For questions or concerns you should contact:   Dr. Jeral Pinch at [phone]   Joylene John, NP at [phone]   After Hours: call [phone] and have the GYN Oncologist paged/contacted (after 5 pm or on the weekends).   Messages sent via mychart are for non-urgent matters and are not responded to after hours so for urgent needs, please call the after hours number.

## 2022-02-11 NOTE — Transfer of Care (Signed)
Immediate Anesthesia Transfer of Care Note  Patient: Linda Gutierrez  Procedure(s) Performed: Procedure(s): XI ROBOTIC ASSISTED BILATERAL SALPINGO OOPHORECTOMY (Bilateral)  Patient Location: PACU  Anesthesia Type:General  Level of Consciousness: Alert, Awake, Oriented  Airway & Oxygen Therapy: Patient Spontanous Breathing  Post-op Assessment: Report given to RN  Post vital signs: Reviewed and stable  Last Vitals:  Vitals:   02/11/22 0908  BP: (!) 138/91  Pulse: 82  Resp: 16  Temp: 37.1 C  SpO2: 43%    Complications: No apparent anesthesia complications

## 2022-02-11 NOTE — Progress Notes (Signed)
                                                                                                                                                             Patient Name: Linda Gutierrez MRN: [id number] DOB: 1975-09-02 Referring Physician: Stark Klein (Profile Not Attached) Date of Service: 01/16/2022 [facility], [place]                                                        End Of Treatment Note  Diagnoses: C79.51-Secondary malignant neoplasm of bone  Cancer Staging:  STAGE IV Intent: Palliative  Radiation Treatment Dates: 01/02/2022 through 01/16/2022 Site Technique Total Dose (Gy) Dose per Fx (Gy) Completed Fx Beam Energies  Pelvis: Pelvis_lumbar 3D 30/30 3 10/10 10X, 15X   Narrative: The patient tolerated radiation therapy relatively well.   Plan: The patient will follow-up with radiation oncology in 54mo  -----------------------------------  SEppie Gibson MD

## 2022-02-11 NOTE — Anesthesia Procedure Notes (Signed)
Procedure Name: Intubation Date/Time: 02/11/2022 10:50 AM  Performed by: Gerald Leitz, CRNAPre-anesthesia Checklist: Patient identified, Patient being monitored, Timeout performed, Emergency Drugs available and Suction available Patient Re-evaluated:Patient Re-evaluated prior to induction Oxygen Delivery Method: Circle system utilized Preoxygenation: Pre-oxygenation with 100% oxygen Induction Type: IV induction Ventilation: Mask ventilation without difficulty Laryngoscope Size: Mac and 3 Grade View: Grade I Tube type: Oral Tube size: 7.5 mm Number of attempts: 1 Airway Equipment and Method: Stylet Placement Confirmation: ETT inserted through vocal cords under direct vision, positive ETCO2 and breath sounds checked- equal and bilateral Secured at: 21 cm Tube secured with: Tape Dental Injury: Teeth and Oropharynx as per pre-operative assessment  Difficulty Due To: Difficult Airway- due to anterior larynx

## 2022-02-11 NOTE — Interval H&P Note (Signed)
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  The various methods of treatment have been discussed with the patient and family. After consideration of risks, benefits and other options for treatment, the patient has consented to  Procedure(s): XI ROBOTIC [place] (Bilateral) as a surgical intervention.  The patient's history has been reviewed, patient examined, no change in status, stable for surgery.  I have reviewed the patient's chart and labs.  Questions were answered to the patient's satisfaction.     Lafonda Mosses

## 2022-02-12 ENCOUNTER — Telehealth: Payer: Self-pay

## 2022-02-12 ENCOUNTER — Encounter (HOSPITAL_COMMUNITY): Payer: Self-pay | Admitting: Gynecologic Oncology

## 2022-02-12 LAB — SURGICAL PATHOLOGY

## 2022-02-12 NOTE — Telephone Encounter (Signed)
Spoke with Linda Gutierrez this morning. She states she is eating, drinking and urinating well. She has not had a BM and has not passed gas. She has miralax at home. Told her to take a capful after lunch and to use bid until good BM. She is to call if she is not passing gas and or having nausea or vomiting. Encouraged her to drink plenty of water. She denies fever or chills. Incisions are dry and intact. She rates her pain 5/10. Her pain is controlled with Tramadol and Ibuprofen.    Instructed to call office with any fever, chills, purulent drainage, uncontrolled pain or any other questions or concerns. Patient verbalizes understanding.   Pt aware of post op appointments as well as the office number [phone] and after hours number [phone] to call if she has any questions or concerns

## 2022-02-13 ENCOUNTER — Inpatient Hospital Stay: Payer: 59

## 2022-02-13 ENCOUNTER — Telehealth: Payer: Self-pay

## 2022-02-13 ENCOUNTER — Inpatient Hospital Stay: Payer: 59 | Admitting: Pharmacist

## 2022-02-13 ENCOUNTER — Telehealth: Payer: Self-pay | Admitting: Gynecologic Oncology

## 2022-02-13 NOTE — Telephone Encounter (Signed)
Spoke with Ms. Linda Gutierrez this morning. She states she is eating, drinking and urinating well. She has not had a BM yet but is passing gas. Encouraged her to drink plenty of water. She denies fever or chills. Incisions are dry and intact. She rates her pain 4/10. Her pain is controlled with Ibuprofen.    Instructed to call office with any fever, chills, purulent drainage, uncontrolled pain or any other questions or concerns. Patient verbalizes understanding.   Pt aware of post op appointments as well as the office number [phone] and after hours number [phone] to call if she has any questions or concerns

## 2022-02-13 NOTE — Telephone Encounter (Signed)
Patient missed my call yesterday.  Had asked that I return her call today when our clinic nurse spoke with her earlier.  I called the patient to discuss her pathology from surgery.  We reviewed diagnosis of a very small incidental borderline tumor.  Given negative imaging prior to surgery as well as what I think is very low utility of any staging (peritoneal biopsies and omentectomy) I do not recommend additional surgery.  We discussed closer surveillance in the setting of a borderline tumor given risk of recurrence and risk of recurrence as an invasive cancer.  We will decide on timing of surveillance, since there are not guidelines for this, when she comes for her in person visit.  Reviewed expectations in terms of surgical recovery.  Patient notes that she is sore and tired but overall doing well.  Jeral Pinch MD Gynecologic Oncology

## 2022-02-14 NOTE — Progress Notes (Signed)
Patient Care Team: Janie Morning, DO as PCP - General (Family Medicine) Nicholas Lose, MD as Consulting Physician (Hematology and Oncology) Stark Klein, MD as Consulting Physician (General Surgery)  DIAGNOSIS: No diagnosis found.  SUMMARY OF ONCOLOGIC HISTORY: Oncology History  CHEK2-related breast cancer ([place])  11/25/2013 Initial Diagnosis   CHEK2-related breast cancer [facility])  May 2015 she noted a lump in the right breast at 2 o'clock. MRI Breast (11/21/13): large geographic areas of the right breast malignancy within the 6 to 8 o'clock position right breast lower outer quadrant extending from the chest wall to the retroareolar area of the right breast. Multiple suspicious nodules within the right axilla. 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    04/18/2014 Surgery   Bilateral mastectomies  Right: DCIS but no residual invasive carcinoma. Margins were uninvolved. Sentinel lymph node had a focus of metastatic carcinoma measuring 0.4 cm surgical stage was Tis N1A. Left: benign     Chemotherapy   Received neoadjuvant chemotherapy with Adriamycin and Cytoxan x4.  She then had surgery and then received adjuvant therapy with Taxol weekly x12        - 11/2018 Anti-estrogen oral therapy   Adjuvant antiestrogen therapy with tamoxifen completed June 2020   12/18/2021 Relapse/Recurrence   Palpable lesion in the right breast.  Mammogram and ultrasound at [facility] revealed recurrent nodules in the reconstructed right breast along with enlarged lymph nodes.  Biopsy of the breast and the lymph nodes was performed that revealed breast cancer that was ER/PR 100% positive HER2 negative.  Breast MRI revealed 3 masses in the right breast measuring 2.4, 2.2, 2.5 cm multiple enlarged axillary lymph nodes measuring up to 2.4 cm subpectoral lymph node 1.3 cm, liver 2.8 cm, 2.5 cm and 2.8 cm masses      CHIEF COMPLIANT: Follow-up metastatic breast cancer on Letrozole  INTERVAL HISTORY: Linda Gutierrez is a  46 y.o. female is here because of a history of CHEK2-related breast cancer. She presents to the clinic today for a follow-up.    ALLERGIES:  has No Known Allergies.  MEDICATIONS:  Current Outpatient Medications  Medication Sig Dispense Refill   abemaciclib (VERZENIO) 150 MG tablet Take 1 tablet (150 mg total) by mouth 2 (two) times daily. (Patient not taking: Reported on 02/03/2022) 56 tablet 5   albuterol (VENTOLIN HFA) 108 (90 Base) MCG/ACT inhaler Inhale 1-2 puffs into the lungs every 6 (six) hours as needed for wheezing or shortness of breath.     busPIRone (BUSPAR) 10 MG tablet Take 10 mg by mouth 2 (two) times daily as needed (anxiety). Taking it daily     calcium carbonate (OS-CAL - DOSED IN MG OF ELEMENTAL CALCIUM) 1250 (500 Ca) MG tablet Take 1 tablet by mouth at bedtime.     cholecalciferol (VITAMIN D3) 25 MCG (1000 UNIT) tablet Take 1,000 Units by mouth daily.     letrozole (FEMARA) 2.5 MG tablet Take 1 tablet (2.5 mg total) by mouth daily. 90 tablet 3   loratadine (CLARITIN) 10 MG tablet Take 10 mg by mouth daily.     MAGNESIUM GLYCINATE PO Take 1 capsule by mouth daily.     Multiple Vitamin (MULTIVITAMIN) tablet Take 1 tablet by mouth daily.     ondansetron (ZOFRAN) 8 MG tablet Take 1 tablet (8 mg total) by mouth every 8 (eight) hours as needed for nausea or vomiting. 20 tablet 5   sertraline (ZOLOFT) 100 MG tablet  Take 100 mg by mouth daily.     traMADol (ULTRAM) 50 MG tablet Take 1 tablet (50 mg total) by mouth every 6 (six) hours as needed. 30 tablet 0   Turmeric (QC TUMERIC COMPLEX PO) Take by mouth. (Patient not taking: Reported on 02/03/2022)     No current facility-administered medications for this visit.    PHYSICAL EXAMINATION: ECOG PERFORMANCE STATUS: {CHL ONC ECOG PS:[id number]}  There were no vitals filed for this visit. There were no vitals filed for  this visit.  BREAST:*** No palpable masses or nodules in either right or left breasts. No palpable axillary supraclavicular or infraclavicular adenopathy no breast tenderness or nipple discharge. (exam performed in the presence of a chaperone)  LABORATORY DATA:  I have reviewed the data as listed    Latest Ref Rng & Units 02/05/2022   11:23 AM 01/30/2022   12:32 PM 01/14/2022    1:43 PM  CMP  Glucose 70 - 99 mg/dL 103  91  96   BUN 6 - 20 mg/dL 14  9  11    Creatinine 0.44 - 1.00 mg/dL 0.72  0.75  0.93   Sodium 135 - 145 mmol/L 139  139  138   Potassium 3.5 - 5.1 mmol/L 4.2  3.8  4.7   Chloride 98 - 111 mmol/L 108  107  106   CO2 22 - 32 mmol/L 25  28  29    Calcium 8.9 - 10.3 mg/dL 8.9  8.8  8.9   Total Protein 6.5 - 8.1 g/dL 7.3  6.9  7.7   Total Bilirubin 0.3 - 1.2 mg/dL 0.7  0.5  0.8   Alkaline Phos 38 - 126 U/L 58  59  64   AST 15 - 41 U/L 22  18  19    ALT 0 - 44 U/L 24  21  17      Lab Results  Component Value Date   WBC 3.7 (L) 02/05/2022   HGB 13.3 02/05/2022   HCT 37.8 02/05/2022   MCV 92.4 02/05/2022   PLT 236 02/05/2022   NEUTROABS 2.4 02/05/2022    ASSESSMENT & PLAN:  No problem-specific Assessment & Plan notes found for this encounter.    No orders of the defined types were placed in this encounter.  The patient has a good understanding of the overall plan. she agrees with it. she will call with any problems that may develop before the next visit here. Total time spent: 30 mins including face to face time and time spent for planning, charting and co-ordination of care   Suzzette Righter, [place] 02/14/22    I Gardiner Coins am scribing for Dr. Lindi Adie  ***

## 2022-02-18 ENCOUNTER — Inpatient Hospital Stay: Payer: 59 | Attending: Adult Health

## 2022-02-18 ENCOUNTER — Inpatient Hospital Stay (HOSPITAL_BASED_OUTPATIENT_CLINIC_OR_DEPARTMENT_OTHER): Payer: 59 | Admitting: Hematology and Oncology

## 2022-02-18 ENCOUNTER — Other Ambulatory Visit: Payer: Self-pay

## 2022-02-18 VITALS — BP 145/95 | HR 78 | Temp 97.5°F | Resp 17 | Ht 68.0 in | Wt 236.5 lb

## 2022-02-18 DIAGNOSIS — Z79811 Long term (current) use of aromatase inhibitors: Secondary | ICD-10-CM | POA: Insufficient documentation

## 2022-02-18 DIAGNOSIS — Z1509 Genetic susceptibility to other malignant neoplasm: Secondary | ICD-10-CM

## 2022-02-18 DIAGNOSIS — C787 Secondary malignant neoplasm of liver and intrahepatic bile duct: Secondary | ICD-10-CM | POA: Insufficient documentation

## 2022-02-18 DIAGNOSIS — Z1589 Genetic susceptibility to other disease: Secondary | ICD-10-CM

## 2022-02-18 DIAGNOSIS — C50811 Malignant neoplasm of overlapping sites of right female breast: Secondary | ICD-10-CM | POA: Diagnosis present

## 2022-02-18 DIAGNOSIS — Z1502 Genetic susceptibility to malignant neoplasm of ovary: Secondary | ICD-10-CM | POA: Diagnosis not present

## 2022-02-18 DIAGNOSIS — C50911 Malignant neoplasm of unspecified site of right female breast: Secondary | ICD-10-CM

## 2022-02-18 DIAGNOSIS — C7951 Secondary malignant neoplasm of bone: Secondary | ICD-10-CM | POA: Insufficient documentation

## 2022-02-18 DIAGNOSIS — Z170 Estrogen receptor positive status [ER+]: Secondary | ICD-10-CM | POA: Insufficient documentation

## 2022-02-18 DIAGNOSIS — C50919 Malignant neoplasm of unspecified site of unspecified female breast: Secondary | ICD-10-CM | POA: Diagnosis not present

## 2022-02-18 DIAGNOSIS — C773 Secondary and unspecified malignant neoplasm of axilla and upper limb lymph nodes: Secondary | ICD-10-CM | POA: Diagnosis not present

## 2022-02-18 LAB — CMP (CANCER CENTER ONLY)
ALT: 20 U/L (ref 0–44)
AST: 18 U/L (ref 15–41)
Albumin: 3.8 g/dL (ref 3.5–5.0)
Alkaline Phosphatase: 67 U/L (ref 38–126)
Anion gap: 5 (ref 5–15)
BUN: 9 mg/dL (ref 6–20)
CO2: 29 mmol/L (ref 22–32)
Calcium: 8.8 mg/dL — ABNORMAL LOW (ref 8.9–10.3)
Chloride: 105 mmol/L (ref 98–111)
Creatinine: 0.75 mg/dL (ref 0.44–1.00)
GFR, Estimated: >60 mL/min (ref >60)
Glucose, Bld: 133 mg/dL — ABNORMAL HIGH (ref 70–99)
Potassium: 3.7 mmol/L (ref 3.5–5.1)
Sodium: 139 mmol/L (ref 135–145)
Total Bilirubin: 0.8 mg/dL (ref 0.3–1.2)
Total Protein: 7.1 g/dL (ref 6.5–8.1)

## 2022-02-18 LAB — CBC WITH DIFFERENTIAL (CANCER CENTER ONLY)
Abs Immature Granulocytes: 0.08 10*3/uL — ABNORMAL HIGH (ref 0.00–0.07)
Basophils Absolute: 0.1 10*3/uL (ref 0.0–0.1)
Basophils Relative: 1 %
Eosinophils Absolute: 0.6 10*3/uL — ABNORMAL HIGH (ref 0.0–0.5)
Eosinophils Relative: 8 %
HCT: 36.8 % (ref 36.0–46.0)
Hemoglobin: 13.2 g/dL (ref 12.0–15.0)
Immature Granulocytes: 1 %
Lymphocytes Relative: 15 %
Lymphs Abs: 1.1 10*3/uL (ref 0.7–4.0)
MCH: 33.2 pg (ref 26.0–34.0)
MCHC: 35.9 g/dL (ref 30.0–36.0)
MCV: 92.5 fL (ref 80.0–100.0)
Monocytes Absolute: 0.5 10*3/uL (ref 0.1–1.0)
Monocytes Relative: 7 %
Neutro Abs: 4.7 10*3/uL (ref 1.7–7.7)
Neutrophils Relative %: 68 %
Platelet Count: 228 10*3/uL (ref 150–400)
RBC: 3.98 MIL/uL (ref 3.87–5.11)
RDW: 13.9 % (ref 11.5–15.5)
WBC Count: 7 10*3/uL (ref 4.0–10.5)
nRBC: 0 % (ref 0.0–0.2)

## 2022-02-18 NOTE — Assessment & Plan Note (Signed)
May 2015: Right breast lump, mammogram ultrasound and MRI showed multiple suspicious nodules within the right axilla. 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, lymph node biopsy: Positive, ER 100%, PR 100%, Ki-67 15%, HER2 equivocal 2+, FISH: 12/26/2021: PET/CT: Small hypermetabolic right chest wall nodules and right axillary lymphadenopathy consistent with recurrent breast cancer, numerous large hepatic metastatic lesions, small scattered pulmonary nodules, diffuse lytic/destructive osseous metastatic lesions especially at L4  Current treatment: Zoladexwith letrozole and Verzinio started 01/03/2022 (Zoladex discontinued since she had BSO on 02/11/2022)  Abemaciclib Toxicities: 1.  Diarrhea: Once a day: Took Imodium 2. nausea: Sent a prescription for Zofran.  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  Return to clinic monthly for labs and follow-up

## 2022-02-21 ENCOUNTER — Ambulatory Visit: Admission: RE | Admit: 2022-02-21 | Payer: 59 | Source: Ambulatory Visit | Admitting: Radiation Oncology

## 2022-02-21 ENCOUNTER — Telehealth: Payer: Self-pay

## 2022-02-21 NOTE — Telephone Encounter (Signed)
I called the patient today about their upcoming follow-up appointment in radiation oncology.   Given the state of the  COVID-19 pandemic, concerning case numbers in our community, and guidance from [facility], I offered a phone assessment with the patient to determine if coming to the clinic was necessary. The patient accepted.  I let the patient know that I had spoken with Dr. Isidore Moos, and she wanted them to know the importance of washing their hands for at least 20 seconds at a time, especially after going out in public, and before they eat. Limit going out in public whenever possible. Do not touch your face, unless your hands are clean, such as when bathing. Get plenty of rest, eat well, and stay hydrated.   Symptomatically, the patient is doing relatively well. She reports her pain and mobility are unchanged. Her fatigue has increased due to verzenio, and she is managing as best she can.    All questions were answered to the patient's satisfaction.  I encouraged the patient to call with any further questions. Otherwise, the plan is follow-up with Dr. Isidore Moos as needed and continue follow-up with medical oncology as scheduled.    Patient is pleased with this plan, and we will cancel their upcoming follow-up to reduce the risk of COVID-19 transmission.

## 2022-02-25 ENCOUNTER — Encounter: Payer: Self-pay | Admitting: Hematology and Oncology

## 2022-02-27 ENCOUNTER — Inpatient Hospital Stay: Payer: 59

## 2022-03-05 ENCOUNTER — Inpatient Hospital Stay: Payer: 59 | Admitting: Pharmacist

## 2022-03-05 ENCOUNTER — Other Ambulatory Visit: Payer: Self-pay

## 2022-03-05 ENCOUNTER — Inpatient Hospital Stay: Payer: 59

## 2022-03-05 VITALS — BP 143/94 | HR 83 | Temp 97.9°F | Resp 16 | Ht 68.0 in | Wt 237.5 lb

## 2022-03-05 DIAGNOSIS — C50911 Malignant neoplasm of unspecified site of right female breast: Secondary | ICD-10-CM

## 2022-03-05 DIAGNOSIS — C50811 Malignant neoplasm of overlapping sites of right female breast: Secondary | ICD-10-CM | POA: Diagnosis not present

## 2022-03-05 LAB — CMP (CANCER CENTER ONLY)
ALT: 20 U/L (ref 0–44)
AST: 19 U/L (ref 15–41)
Albumin: 3.6 g/dL (ref 3.5–5.0)
Alkaline Phosphatase: 56 U/L (ref 38–126)
Anion gap: 5 (ref 5–15)
BUN: 12 mg/dL (ref 6–20)
CO2: 28 mmol/L (ref 22–32)
Calcium: 9 mg/dL (ref 8.9–10.3)
Chloride: 108 mmol/L (ref 98–111)
Creatinine: 1.04 mg/dL — ABNORMAL HIGH (ref 0.44–1.00)
GFR, Estimated: >60 mL/min (ref >60)
Glucose, Bld: 92 mg/dL (ref 70–99)
Potassium: 4 mmol/L (ref 3.5–5.1)
Sodium: 141 mmol/L (ref 135–145)
Total Bilirubin: 0.6 mg/dL (ref 0.3–1.2)
Total Protein: 7.4 g/dL (ref 6.5–8.1)

## 2022-03-05 LAB — CBC WITH DIFFERENTIAL (CANCER CENTER ONLY)
Abs Immature Granulocytes: 0.01 10*3/uL (ref 0.00–0.07)
Basophils Absolute: 0.1 10*3/uL (ref 0.0–0.1)
Basophils Relative: 1 %
Eosinophils Absolute: 0.4 10*3/uL (ref 0.0–0.5)
Eosinophils Relative: 8 %
HCT: 35.7 % — ABNORMAL LOW (ref 36.0–46.0)
Hemoglobin: 13.1 g/dL (ref 12.0–15.0)
Immature Granulocytes: 0 %
Lymphocytes Relative: 21 %
Lymphs Abs: 0.9 10*3/uL (ref 0.7–4.0)
MCH: 33.9 pg (ref 26.0–34.0)
MCHC: 36.7 g/dL — ABNORMAL HIGH (ref 30.0–36.0)
MCV: 92.5 fL (ref 80.0–100.0)
Monocytes Absolute: 0.3 10*3/uL (ref 0.1–1.0)
Monocytes Relative: 8 %
Neutro Abs: 2.7 10*3/uL (ref 1.7–7.7)
Neutrophils Relative %: 62 %
Platelet Count: 215 10*3/uL (ref 150–400)
RBC: 3.86 MIL/uL — ABNORMAL LOW (ref 3.87–5.11)
RDW: 14.1 % (ref 11.5–15.5)
WBC Count: 4.3 10*3/uL (ref 4.0–10.5)
nRBC: 0 % (ref 0.0–0.2)

## 2022-03-05 NOTE — Progress Notes (Signed)
[place]       Telephone: [phone]?Fax: [phone]   Oncology Clinical Pharmacist Practitioner Progress Note  Linda Gutierrez was contacted via in person to discuss her chemotherapy regimen for abemaciclib which they receive under the care of Dr. Nicholas Lose.   Current treatment regimen and start date Abemaciclib (01/03/22) Letrozole (12/27/21) Goserelin (01/03/22) -- discontinued due to BSO on 02/11/22 Denosumab 120 mg (will start tentatively 05/28/22 after meeting with Dr. Lindi Adie)   Interval History She continues on abemaciclib 150 mg by mouth every 12 hours on days 1 to 28 of a 28-day cycle. This is being given in combination with letrozole 2.5 mg by mouth daily. Goserelin has been discontinued due to BSO on 02/11/22 performed by Dr. Berline Lopes. Abemaciclib therapy is planned to continue until disease progression or unacceptable toxicity.  She was seen today by clinical pharmacy as a follow-up to her abemaciclib management.  She last saw clinical pharmacy on 01/30/22 and Dr. Lindi Adie on 02/18/22.  She had held her abemaciclib starting 01/30/22, BSO was done on 02/11/22, and she restarted abemaciclib on 02/19/22 once it was found that she was not going to have a right total hip replacement.  Response to Therapy Linda Gutierrez is doing well.  Her main concern today is loose stools.  She states that they have increased since restarting abemaciclib.  We did discover today that she is only taking 1 tablet of loperamide daily.  We reviewed today that she could take up to 8 tablets in a 24-hour period and she verbalized understanding of the plan.  We did discuss other options such as the brat diet, eating smaller more frequent meals, avoiding rich in greasy foods.  We also gave her some printed material on foods she could consider when having loose stools.  She knows that we can try prescription agents if these other options do not work.  She verbalized understanding of the plan and knows to  contact Dr. Geralyn Flash clinic prior to her next visit in 1 month should her symptoms not get better.  Her nausea is very mild.  She is not needing any antinausea meds at this time, however she does have them on hand.  Her blood pressure was slightly elevated today as well as her serum creatinine.  We will continue to monitor.  She will have monthly labs in October and November and see clinical pharmacy at that time for a toxicity assessment check.  She will then see Dr. Lindi Adie with labs in December and likely start denosumab 120 mg every 12 weeks.  This plan has been entered and the prior Auth team is aware. Labs, vitals, treatment parameters, and manufacturer guidelines assessing toxicity were reviewed with Linda Gutierrez today. Based on these values, patient is in agreement to continue abemaciclib therapy at this time.  Allergies No Known Allergies  Vitals    03/05/2022    3:03 PM 02/18/2022    3:02 PM 02/11/2022    2:04 PM  Vitals with BMI  Height '5\' 8"'$  '5\' 8"'$    Weight 237 lbs 8 oz 236 lbs 8 oz   BMI 63.33 54.56   Systolic 256 389 373  Diastolic 94 95 99  Pulse 83 78 66   Temp Readings from Last 3 Encounters:  03/05/22 97.9 F (36.6 C) (Temporal)  02/18/22 (!) 97.5 F (36.4 C) (Temporal)  02/11/22 98.6 F (37 C)    Laboratory Data    Latest Ref Rng & Units 03/05/2022    2:34  PM 02/18/2022    2:50 PM 02/05/2022   11:23 AM  CBC EXTENDED  WBC 4.0 - 10.5 K/uL 4.3  7.0  3.7   RBC 3.87 - 5.11 MIL/uL 3.86  3.98  4.09   Hemoglobin 12.0 - 15.0 g/dL 13.1  13.2  13.3   HCT 36.0 - 46.0 % 35.7  36.8  37.8   Platelets 150 - 400 K/uL 215  228  236   NEUT# 1.7 - 7.7 K/uL 2.7  4.7  2.4   Lymph# 0.7 - 4.0 K/uL 0.9  1.1  0.7        Latest Ref Rng & Units 03/05/2022    2:34 PM 02/18/2022    2:50 PM 02/05/2022   11:23 AM  CMP  Glucose 70 - 99 mg/dL 92  133  103   BUN 6 - 20 mg/dL '12  9  14   '$ Creatinine 0.44 - 1.00 mg/dL 1.04  0.75  0.72   Sodium 135 - 145 mmol/L 141  139  139   Potassium  3.5 - 5.1 mmol/L 4.0  3.7  4.2   Chloride 98 - 111 mmol/L 108  105  108   CO2 22 - 32 mmol/L '28  29  25   '$ Calcium 8.9 - 10.3 mg/dL 9.0  8.8  8.9   Total Protein 6.5 - 8.1 g/dL 7.4  7.1  7.3   Total Bilirubin 0.3 - 1.2 mg/dL 0.6  0.8  0.7   Alkaline Phos 38 - 126 U/L 56  67  58   AST 15 - 41 U/L '19  18  22   '$ ALT 0 - 44 U/L '20  20  24     '$ No results found for: "MG"  Adverse Effects Assessment Diarrhea: Has increased since she restarted abemaciclib on 02/19/22.  As above, she was not taking loperamide as directed.  We have reeducated on how to take loperamide up to 8 tablets in a 24-hour period (2 tablets with the first loose stool).  1 tablet with each additional loose stool.  We have also given written documentation on foods that she can try while having loose stools. Nausea: Very mild.  Not taking any antinausea meds at this time Serum creatinine elevation: She continues to drink plenty of fluids.  Slightly increased from last time.  We will continue to monitor.  Adherence Assessment Linda Gutierrez reports missing 1 doses over the past 4 weeks.   Reason for missed dose: Morning dose, forgot. Patient was re-educated on importance of adherence.   Access Assessment Linda Gutierrez is currently receiving her abemaciclib through [facility]. Insurance concerns: None  Medication Reconciliation The patient's medication list was reviewed today with the patient?  Yes New medications or herbal supplements have recently been started?  Yes, medication list updated Any medications have been discontinued?  Yes, medication list updated The medication list was updated and reconciled based on the patient's most recent medication list in the electronic medical record (EMR) including herbal products and OTC medications.   Medications Current Outpatient Medications  Medication Sig Dispense Refill   abemaciclib (VERZENIO) 150 MG tablet Take 1 tablet (150 mg total) by mouth 2 (two) times  daily. 56 tablet 5   busPIRone (BUSPAR) 10 MG tablet Take 10 mg by mouth 2 (two) times daily as needed (anxiety). Taking it daily     calcium carbonate (OS-CAL - DOSED IN MG OF ELEMENTAL CALCIUM) 1250 (500 Ca) MG tablet Take 1 tablet by mouth 3 (three) times  daily with meals.     cholecalciferol (VITAMIN D3) 25 MCG (1000 UNIT) tablet Take 5,000 Units by mouth daily.     CLINPRO 5000 1.1 % PSTE Place onto teeth.     letrozole (FEMARA) 2.5 MG tablet Take 1 tablet (2.5 mg total) by mouth daily. 90 tablet 3   levocetirizine (XYZAL) 5 MG tablet Take 5 mg by mouth every evening.     MAGNESIUM GLYCINATE PO Take 1 capsule by mouth daily.     montelukast (SINGULAIR) 10 MG tablet Take 10 mg by mouth at bedtime.     Multiple Vitamin (MULTIVITAMIN) tablet Take 1 tablet by mouth daily.     sertraline (ZOLOFT) 100 MG tablet Take 200 mg by mouth daily.     Turmeric (QC TUMERIC COMPLEX PO) Take by mouth.     albuterol (VENTOLIN HFA) 108 (90 Base) MCG/ACT inhaler Inhale 1-2 puffs into the lungs every 6 (six) hours as needed for wheezing or shortness of breath. (Patient not taking: Reported on 03/05/2022)     ondansetron (ZOFRAN) 8 MG tablet Take 1 tablet (8 mg total) by mouth every 8 (eight) hours as needed for nausea or vomiting. (Patient not taking: Reported on 03/05/2022) 20 tablet 5   traMADol (ULTRAM) 50 MG tablet Take 1 tablet (50 mg total) by mouth every 6 (six) hours as needed. (Patient not taking: Reported on 03/05/2022) 30 tablet 0   No current facility-administered medications for this visit.    Drug-Drug Interactions (DDIs) DDIs were evaluated?  Yes Significant DDIs?  Yes we again went over the possible side effect of serotonin syndrome and she knows the warning signs to watch out for.  We have provided written information on serotonin syndrome in the past. The patient was instructed to speak with their health care provider before starting any new drug, including prescription or over the counter,  natural / herbal products, or vitamins.  Supportive Care Diarrhea: we reviewed that diarrhea is common with abemaciclib and confirmed that she does have loperamide (Imodium) at home.  We reviewed how to take this medication PRN ILD/Pneumonitis: we reviewed potential symptoms including cough, shortness, and fatigue.  VTE: reviewed signs of DVT such as leg swelling, redness, pain, or tenderness and signs of PE such as shortness of breath, rapid or irregular heartbeat, cough, chest pain, or lightheadedness  Dosing Assessment Hepatic adjustments needed? No  Renal adjustments needed? No  Toxicity adjustments needed? No  The current dosing regimen is appropriate to continue at this time.  Follow-Up Plan Continue abemaciclib 150 mg by mouth every 12 hours Continue loperamide as needed for diarrhea.  Have discussed with patient that she can take up to 8 tablets in a 24-hour period Consider referral to dietitian if her appetite does not improve.  She preferred to try recommendations given today in clinic first. Continue letrozole 2.5 mg by mouth daily She will likely start denosumab 120 mg every 12 weeks on 05/28/22 after having labs and seeing Dr. Lindi Adie. Labs, pharmacy clinic visit, on 04/03/22 Continue to monitor serum creatinine If she continues to tolerate abemaciclib, she will likely have labs every 3 months after December to coincide with her every 12-week denosumab 120 mg injection.  Linda Gutierrez participated in the discussion, expressed understanding, and voiced agreement with the above plan. All questions were answered to her satisfaction. The patient was advised to contact the clinic at [phone] with any questions or concerns prior to her return visit.   I spent 30 minutes assessing and educating  the patient.  Raina Mina, RPH-CPP, 03/05/2022  3:41 PM   **Disclaimer: This note was dictated with voice recognition software. Similar sounding words can inadvertently be  transcribed and this note may contain transcription errors which may not have been corrected upon publication of note.**

## 2022-03-10 ENCOUNTER — Encounter: Payer: Self-pay | Admitting: Gynecologic Oncology

## 2022-03-11 ENCOUNTER — Inpatient Hospital Stay: Payer: 59

## 2022-03-11 ENCOUNTER — Inpatient Hospital Stay: Payer: 59 | Attending: Adult Health | Admitting: Gynecologic Oncology

## 2022-03-11 ENCOUNTER — Encounter: Payer: Self-pay | Admitting: Gynecologic Oncology

## 2022-03-11 VITALS — BP 143/94 | HR 83 | Temp 98.3°F | Resp 16 | Ht 67.72 in | Wt 233.0 lb

## 2022-03-11 DIAGNOSIS — Z79811 Long term (current) use of aromatase inhibitors: Secondary | ICD-10-CM | POA: Diagnosis not present

## 2022-03-11 DIAGNOSIS — Z9013 Acquired absence of bilateral breasts and nipples: Secondary | ICD-10-CM | POA: Insufficient documentation

## 2022-03-11 DIAGNOSIS — Z79899 Other long term (current) drug therapy: Secondary | ICD-10-CM | POA: Diagnosis not present

## 2022-03-11 DIAGNOSIS — C50811 Malignant neoplasm of overlapping sites of right female breast: Secondary | ICD-10-CM | POA: Diagnosis present

## 2022-03-11 DIAGNOSIS — Z87891 Personal history of nicotine dependence: Secondary | ICD-10-CM | POA: Insufficient documentation

## 2022-03-11 DIAGNOSIS — Z170 Estrogen receptor positive status [ER+]: Secondary | ICD-10-CM | POA: Insufficient documentation

## 2022-03-11 DIAGNOSIS — Z1509 Genetic susceptibility to other malignant neoplasm: Secondary | ICD-10-CM

## 2022-03-11 DIAGNOSIS — Z7189 Other specified counseling: Secondary | ICD-10-CM

## 2022-03-11 DIAGNOSIS — C50919 Malignant neoplasm of unspecified site of unspecified female breast: Secondary | ICD-10-CM

## 2022-03-11 DIAGNOSIS — D3910 Neoplasm of uncertain behavior of unspecified ovary: Secondary | ICD-10-CM

## 2022-03-11 DIAGNOSIS — Z1502 Genetic susceptibility to malignant neoplasm of ovary: Secondary | ICD-10-CM

## 2022-03-11 DIAGNOSIS — Z1589 Genetic susceptibility to other disease: Secondary | ICD-10-CM

## 2022-03-11 NOTE — Patient Instructions (Addendum)
It was good to see you.  You are healing well from surgery.  Please remember, no heavy lifting for at least 6 weeks after surgery.  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  Again, this is like a precancer of the ovary.  Yours was unexpected and very small.  There are no guidelines for how frequently we should follow you.  We are justified to follow you as if you had ovarian cancer with visits every 3 months initially or can follow you as infrequently as every year since both of your ovaries have been removed.  Based on our discussion today, we will start with visits every 3 months.  These will involve reviewing any symptoms she might be having, performing a pelvic exam, and getting a blood test or tumor marker.  My schedule is not out past the end of December at this time.  Please call back sometime in December to schedule a visit to see me in January.  Between visits, please call me if you develop any of the symptoms that we talked about today.

## 2022-03-11 NOTE — Progress Notes (Signed)
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 MRI Breast (11/21/13): large geographic areas of the right breast malignancy within the 6 to 8 o'clock position right breast lower outer quadrant extending from the chest wall to the retroareolar area of the right breast. Multiple suspicious nodules within the right axilla. 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    04/18/2014 Surgery   Bilateral mastectomies  Right: DCIS but no residual invasive carcinoma. Margins were uninvolved. Sentinel lymph node had a focus of metastatic carcinoma measuring 0.4 cm surgical stage was Tis N1A. Left: benign     Chemotherapy   Received neoadjuvant chemotherapy with Adriamycin and Cytoxan x4.  She then had surgery and then received adjuvant therapy with Taxol weekly x12        - 11/2018 Anti-estrogen oral therapy   Adjuvant antiestrogen therapy with tamoxifen completed June 2020   12/18/2021 Relapse/Recurrence   Palpable lesion in the right breast.  Mammogram and ultrasound at [facility] revealed recurrent nodules in the reconstructed right breast along with enlarged lymph nodes.  Biopsy of the breast and the lymph nodes was performed that revealed breast cancer that was ER/PR 100% positive HER2 negative.  Breast MRI revealed 3 masses in the right breast measuring 2.4, 2.2, 2.5 cm multiple enlarged axillary lymph nodes measuring up to 2.4 cm subpectoral lymph node 1.3 cm, liver 2.8 cm, 2.5 cm and 2.8 cm masses    02/11/22: Therapeutic BSO in the setting of ER+ metastatic breast cancer  Interval History: Patient reports doing well.  Denies any  significant abdominal or pelvic pain.  Denies any vaginal bleeding.  Reports baseline bowel and bladder function.  Endorses some hot flashes.  Past Medical/Surgical History: Past Medical History:  Diagnosis Date   Abnormal Pap smear of cervix 2016   HPV   Arthritis    Asthma    Breast cancer ([place]) 2015   rt side   Depression    GERD (gastroesophageal reflux disease)    History of hiatal hernia    Pneumonia    STD (sexually transmitted disease)    HPV   Urinary incontinence     Past Surgical History:  Procedure Laterality Date   BREAST RECONSTRUCTION     BREAST RECONSTRUCTION     with implants   CERVICAL BIOPSY  W/ LOOP ELECTRODE EXCISION     COLPOSCOPY     MASTECTOMY Bilateral 2017   ROBOTIC ASSISTED BILATERAL SALPINGO OOPHERECTOMY Bilateral 02/11/2022   Procedure: XI ROBOTIC ASSISTED BILATERAL SALPINGO OOPHORECTOMY;  Surgeon: Lafonda Mosses, MD;  Location: [facility];  Service: Gynecology;  Laterality: Bilateral;   TOTAL VAGINAL HYSTERECTOMY      Family History  Problem Relation Age of Onset   Thyroid disease Mother    Uterine cancer Mother    Diabetes Father    Hypertension Father    Lupus Father    Diabetes Maternal Grandmother    Heart disease Maternal Grandmother    Colon cancer Neg Hx    Breast cancer Neg Hx    Ovarian cancer Neg Hx    Endometrial cancer Neg Hx    Pancreatic cancer Neg Hx    Prostate cancer Neg Hx  Social History   Socioeconomic History   Marital status: Married    Spouse name: Not on file   Number of children: Not on file   Years of education: Not on file   Highest education level: Not on file  Occupational History   Occupation: works as a Geophysical data processor  Tobacco Use   Smoking status: Former    Packs/day: 0.50    Years: 10.00    Total pack years: 5.00    Types: Cigarettes    Quit date: 2015    Years since quitting: 8.7   Smokeless tobacco: Never  Vaping Use   Vaping Use: Never used  Substance and Sexual Activity    Alcohol use: Yes    Alcohol/week: 6.0 standard drinks of alcohol    Types: 6 Standard drinks or equivalent per week    Comment: rarely   Drug use: Never   Sexual activity: Not Currently    Partners: Male    Birth control/protection: Surgical    Comment: hysterectomy 2017  Other Topics Concern   Not on file  Social History Narrative   Not on file   Social Determinants of Health   Financial Resource Strain: Not on file  Food Insecurity: Not on file  Transportation Needs: Not on file  Physical Activity: Not on file  Stress: Not on file  Social Connections: Not on file    Current Medications:  Current Outpatient Medications:    abemaciclib (VERZENIO) 150 MG tablet, Take 1 tablet (150 mg total) by mouth 2 (two) times daily., Disp: 56 tablet, Rfl: 5   busPIRone (BUSPAR) 10 MG tablet, Take 10 mg by mouth 2 (two) times daily as needed (anxiety). Taking it daily, Disp: , Rfl:    calcium carbonate (OS-CAL - DOSED IN MG OF ELEMENTAL CALCIUM) 1250 (500 Ca) MG tablet, Take 1 tablet by mouth 3 (three) times daily with meals., Disp: , Rfl:    cholecalciferol (VITAMIN D3) 25 MCG (1000 UNIT) tablet, Take 5,000 Units by mouth daily., Disp: , Rfl:    CLINPRO 5000 1.1 % PSTE, Place onto teeth., Disp: , Rfl:    letrozole (FEMARA) 2.5 MG tablet, Take 1 tablet (2.5 mg total) by mouth daily., Disp: 90 tablet, Rfl: 3   levocetirizine (XYZAL) 5 MG tablet, Take 5 mg by mouth every evening., Disp: , Rfl:    MAGNESIUM GLYCINATE PO, Take 1 capsule by mouth daily., Disp: , Rfl:    montelukast (SINGULAIR) 10 MG tablet, Take 10 mg by mouth at bedtime., Disp: , Rfl:    Multiple Vitamin (MULTIVITAMIN) tablet, Take 1 tablet by mouth daily., Disp: , Rfl:    sertraline (ZOLOFT) 100 MG tablet, Take 200 mg by mouth daily., Disp: , Rfl:    Turmeric (QC TUMERIC COMPLEX PO), Take by mouth., Disp: , Rfl:    BREO ELLIPTA 200-25 MCG/ACT AEPB, Inhale 1 puff into the lungs daily., Disp: , Rfl:   Review of Systems: Denies  appetite changes, fevers, chills, fatigue, unexplained weight changes. Denies hearing loss, neck lumps or masses, mouth sores, ringing in ears or voice changes. Denies cough or wheezing.  Denies shortness of breath. Denies chest pain or palpitations. Denies leg swelling. Denies abdominal distention, pain, blood in stools, constipation, diarrhea, nausea, vomiting, or early satiety. Denies pain with intercourse, dysuria, frequency, hematuria or incontinence. Denies pelvic pain, vaginal bleeding or vaginal discharge.   Denies joint pain, back pain or muscle pain/cramps. Denies itching, rash, or wounds. Denies dizziness, headaches, numbness or seizures. Denies swollen lymph  nodes or glands, denies easy bruising or bleeding. Denies anxiety, depression, confusion, or decreased concentration.  Physical Exam: BP (!) 143/94 (BP Location: Left Arm, Patient Position: Sitting)   Pulse 83   Temp 98.3 F (36.8 C) (Oral)   Resp 16   Ht 5' 7.72" (1.72 m)   Wt 233 lb (105.7 kg)   SpO2 94%   BMI 35.72 kg/m  General: Alert, oriented, no acute distress. HEENT: Normocephalic, atraumatic, sclera anicteric. Chest: Unlabored breathing on room air. Abdomen: Obese, soft, nontender. No masses or hepatosplenomegaly appreciated.  Well-healed incisions, mild scabbing and erythema of the umbilical incision. Extremities: Grossly normal range of motion.  Warm, well perfused.  No edema bilaterally.  Laboratory & Radiologic Studies: A. OVARIES AND FALLOPIAN TUBES, BILATERAL, SALPINGO OOPHORECTOMY:  -  1 unoriented ovary with an incidental serous borderline tumor with  micropapillary architecture (6 mm in greatest dimension), confined to  cyst and negative for invasion  -  Second unoriented ovary with no significant pathology.  -  Tubular structures with large vessels no fallopian tube present  microscopically or grossly.   Assessment & Plan: Linda Gutierrez is a 46 y.o. 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  Patient is doing well from surgery.  Discussed continued expectations and restrictions.  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  Discussed this diagnosis and typical staging procedure that would be done if this has been known at the time of surgery.  Given normal intra-abdominal findings and recent negative CT scan, I do not think that the patient would benefit from additional surgery solely for the purpose of peritoneal biopsies and omentectomy.  We discussed the risk of recurrence of a borderline tumor even after BSO.  Very rarely, these tumors can come back as invasive carcinoma.  We discussed that there are not strict guidelines in terms of how frequently to follow patients after diagnosis of borderline tumor.  Per NCCN surveillance recommendations, we can follow patients as frequently as every 3 months initially, as if she had ovarian cancer.  Per [facility]GO surveillance recommendations, after BSO, patients can be followed as infrequently as yearly.  Given everything going on with her breast cancer, the patient's preference is to start with visits every 3 months.  We will plan to do a pelvic exam, review of symptoms, and get a CA125 at these visits.  I discussed that we do not know if CA125 was a tumor marker for her.  We reviewed signs and symptoms that would be concerning for borderline tumor recurrence.  I stressed the importance of calling if she develops any of these.  I have asked the patient to call in December to schedule a visit to see me in January.  She is already scheduled for a CT scan for breast cancer surveillance in December.  22 minutes of total time was spent for this patient encounter, including preparation, face-to-face counseling with the patient and coordination of care, and documentation of the encounter.  Jeral Pinch, MD   Division of Gynecologic Oncology  Department of Obstetrics and Gynecology  [facility]

## 2022-03-13 ENCOUNTER — Inpatient Hospital Stay: Payer: 59

## 2022-03-13 ENCOUNTER — Inpatient Hospital Stay: Payer: 59 | Admitting: Pharmacist

## 2022-03-13 LAB — CA 125: Cancer Antigen (CA) 125: 21.6 U/mL (ref 0.0–38.1)

## 2022-04-03 ENCOUNTER — Inpatient Hospital Stay: Payer: 59

## 2022-04-03 ENCOUNTER — Inpatient Hospital Stay: Payer: 59 | Admitting: Pharmacist

## 2022-04-03 VITALS — BP 123/78 | HR 80 | Temp 97.5°F | Resp 18 | Ht 67.72 in | Wt 232.4 lb

## 2022-04-03 DIAGNOSIS — C50811 Malignant neoplasm of overlapping sites of right female breast: Secondary | ICD-10-CM | POA: Diagnosis not present

## 2022-04-03 DIAGNOSIS — C50911 Malignant neoplasm of unspecified site of right female breast: Secondary | ICD-10-CM

## 2022-04-03 LAB — CBC WITH DIFFERENTIAL (CANCER CENTER ONLY)
Abs Immature Granulocytes: 0.02 10*3/uL (ref 0.00–0.07)
Basophils Absolute: 0.1 10*3/uL (ref 0.0–0.1)
Basophils Relative: 1 %
Eosinophils Absolute: 0.1 10*3/uL (ref 0.0–0.5)
Eosinophils Relative: 2 %
HCT: 36.8 % (ref 36.0–46.0)
Hemoglobin: 13.4 g/dL (ref 12.0–15.0)
Immature Granulocytes: 0 %
Lymphocytes Relative: 19 %
Lymphs Abs: 1 10*3/uL (ref 0.7–4.0)
MCH: 35.8 pg — ABNORMAL HIGH (ref 26.0–34.0)
MCHC: 36.4 g/dL — ABNORMAL HIGH (ref 30.0–36.0)
MCV: 98.4 fL (ref 80.0–100.0)
Monocytes Absolute: 0.4 10*3/uL (ref 0.1–1.0)
Monocytes Relative: 8 %
Neutro Abs: 3.6 10*3/uL (ref 1.7–7.7)
Neutrophils Relative %: 70 %
Platelet Count: 239 10*3/uL (ref 150–400)
RBC: 3.74 MIL/uL — ABNORMAL LOW (ref 3.87–5.11)
RDW: 15.1 % (ref 11.5–15.5)
WBC Count: 5.1 10*3/uL (ref 4.0–10.5)
nRBC: 0 % (ref 0.0–0.2)

## 2022-04-03 LAB — CMP (CANCER CENTER ONLY)
ALT: 15 U/L (ref 0–44)
AST: 16 U/L (ref 15–41)
Albumin: 4.2 g/dL (ref 3.5–5.0)
Alkaline Phosphatase: 56 U/L (ref 38–126)
Anion gap: 6 (ref 5–15)
BUN: 11 mg/dL (ref 6–20)
CO2: 28 mmol/L (ref 22–32)
Calcium: 9.2 mg/dL (ref 8.9–10.3)
Chloride: 107 mmol/L (ref 98–111)
Creatinine: 0.93 mg/dL (ref 0.44–1.00)
GFR, Estimated: >60 mL/min (ref >60)
Glucose, Bld: 96 mg/dL (ref 70–99)
Potassium: 4.5 mmol/L (ref 3.5–5.1)
Sodium: 141 mmol/L (ref 135–145)
Total Bilirubin: 0.8 mg/dL (ref 0.3–1.2)
Total Protein: 7.7 g/dL (ref 6.5–8.1)

## 2022-04-03 NOTE — Progress Notes (Signed)
[place]       Telephone: [phone]?Fax: [phone]   Oncology Clinical Pharmacist Practitioner Progress Note  Linda Gutierrez was contacted via in person to discuss her chemotherapy regimen for abemaciclib which they receive under the care of Dr. Nicholas Lose.   Current treatment regimen and start date Abemaciclib (01/03/22) Letrozole (12/27/21) Goserelin (01/03/22) -- discontinued due to BSO on 02/11/22 Denosumab 120 mg (will start tentatively 05/28/22 after meeting with Dr. Lindi Adie)   Interval History She continues on abemaciclib 150 mg by mouth every 12 hours on days 1 to 28 of a 28-day cycle. This is being given in combination with letrozole 2.5 mg by mouth daily. Abemaciclib therapy is planned to continue until disease progression or unacceptable toxicity.  She was seen today by clinical pharmacy as a follow-up to her abemaciclib management.  She last saw clinical pharmacy on 03/05/22 and Dr. Lindi Adie on 02/18/22.  She had held her abemaciclib starting 01/30/22, BSO was done on 02/11/22, and she restarted abemaciclib on 02/19/22 once it was found that she was not going to have a right total hip replacement. She has restaging scans planned for 05/14/22 and will see Dr. Lindi Adie again next on 05/28/22 when she may start denosumab 120 mg every 12 week cycle.  Response to Therapy Ms. process is doing well.  She continues to have diarrhea, 1-2 times per day.  She usually takes an initial 2 tabs of loperamide, followed by 2 more throughout the day.  Because her diarrhea does seem to be fairly regular, we did discuss that she could consider taking loperamide once daily in the morning and then evaluating her loose stools throughout the day.  Again, we discussed that she can take up to 8 tabs of loperamide (16 mg) in a 24-hour period.  She will consider this recommendation.  She is having some mild nausea but when she sits down and drinks some water it goes away fairly quickly.  She is not  taking any antinausea medications at this time but does have them on hand if needed.  She is not experiencing any other side effects at this time.  We did again go over signs and symptoms of pneumonitis and DVT and she knows that she can contact the clinic prior to her next visit with Dr. Lindi Adie on 05/28/22 with any questions or concerns.  We discussed that at her next visit with Dr. Lindi Adie if she is tolerating the medication well and her scans look good, she can consider having labs and visits every 12 weeks to coincide with her denosumab 120 mg which likely will start on 05/28/22. Labs, vitals, treatment parameters, and manufacturer guidelines assessing toxicity were reviewed with Linda Gutierrez today. Based on these values, patient is in agreement to continue abemaciclib therapy at this time.  Allergies No Known Allergies  Vitals    04/03/2022    1:24 PM 03/11/2022   11:11 AM 03/05/2022    3:03 PM  Oncology Vitals  Height 172 cm 172 cm 173 cm  Weight 105.416 kg 105.688 kg 107.729 kg  Weight (lbs) 232 lbs 6 oz 233 lbs 237 lbs 8 oz  BMI 35.63 kg/m2   35.63 kg/m2 35.72 kg/m2   35.72 kg/m2 36.11 kg/m2   36.11 kg/m2  Temp 97.5 F (36.4 C) 98.3 F (36.8 C) 97.9 F (36.6 C)  Pulse Rate 80 83 83  BP 123/78 143/94 143/94  Resp '18 16 16  '$ SpO2 100 % 94 % 98 %  BSA (  m2) 2.24 m2   2.24 m2 2.25 m2   2.25 m2 2.27 m2   2.27 m2   Laboratory Data    Latest Ref Rng & Units 04/03/2022   12:23 PM 03/05/2022    2:34 PM 02/18/2022    2:50 PM  CBC EXTENDED  WBC 4.0 - 10.5 K/uL 5.1  4.3  7.0   RBC 3.87 - 5.11 MIL/uL 3.74  3.86  3.98   Hemoglobin 12.0 - 15.0 g/dL 13.4  13.1  13.2   HCT 36.0 - 46.0 % 36.8  35.7  36.8   Platelets 150 - 400 K/uL 239  215  228   NEUT# 1.7 - 7.7 K/uL 3.6  2.7  4.7   Lymph# 0.7 - 4.0 K/uL 1.0  0.9  1.1        Latest Ref Rng & Units 04/03/2022   12:23 PM 03/05/2022    2:34 PM 02/18/2022    2:50 PM  CMP  Glucose 70 - 99 mg/dL 96  92  133   BUN 6 - 20 mg/dL '11  12   9   '$ Creatinine 0.44 - 1.00 mg/dL 0.93  1.04  0.75   Sodium 135 - 145 mmol/L 141  141  139   Potassium 3.5 - 5.1 mmol/L 4.5  4.0  3.7   Chloride 98 - 111 mmol/L 107  108  105   CO2 22 - 32 mmol/L '28  28  29   '$ Calcium 8.9 - 10.3 mg/dL 9.2  9.0  8.8   Total Protein 6.5 - 8.1 g/dL 7.7  7.4  7.1   Total Bilirubin 0.3 - 1.2 mg/dL 0.8  0.6  0.8   Alkaline Phos 38 - 126 U/L 56  56  67   AST 15 - 41 U/L '16  19  18   '$ ALT 0 - 44 U/L '15  20  20    '$ Adverse Effects Assessment Diarrhea: Having less diarrhea now that she is taking loperamide as directed.  She continues to have 1-2 loose stools per day and is using approximately 4 tablets of loperamide daily.  She will consider taking loperamide every morning to see if this makes the loose stools more manageable.  She does get concerned that she may be out in public and have an accident which is understandable. Nausea: Mild in nature.  She is not using any antinausea meds at this time but does have them on hand.  When she does feel nauseous, she sits down and has some water and this seems to make the nausea subside fairly quickly.  Adherence Assessment Linda Gutierrez reports missing 3 doses over the past 4 weeks.   Reason for missed dose: Was in the process of moving and her Apple Watch died which had the alerts on it Patient was re-educated on importance of adherence.   Access Assessment Linda Gutierrez is currently receiving her abemaciclib through [facility] and having no issues Insurance concerns: None  Medication Reconciliation The patient's medication list was reviewed today with the patient?  Yes New medications or herbal supplements have recently been started?  No Any medications have been discontinued?  No The medication list was updated and reconciled based on the patient's most recent medication list in the electronic medical record (EMR) including herbal products and OTC medications.   Medications Current Outpatient  Medications  Medication Sig Dispense Refill   abemaciclib (VERZENIO) 150 MG tablet Take 1 tablet (150 mg total) by mouth 2 (two) times daily. [place]  tablet 5   BREO ELLIPTA 200-25 MCG/ACT AEPB Inhale 1 puff into the lungs daily.     busPIRone (BUSPAR) 10 MG tablet Take 10 mg by mouth 2 (two) times daily as needed (anxiety). Taking it daily     calcium carbonate (OS-CAL - DOSED IN MG OF ELEMENTAL CALCIUM) 1250 (500 Ca) MG tablet Take 1 tablet by mouth 3 (three) times daily with meals.     cholecalciferol (VITAMIN D3) 25 MCG (1000 UNIT) tablet Take 5,000 Units by mouth daily.     CLINPRO 5000 1.1 % PSTE Place onto teeth.     letrozole (FEMARA) 2.5 MG tablet Take 1 tablet (2.5 mg total) by mouth daily. 90 tablet 3   levocetirizine (XYZAL) 5 MG tablet Take 5 mg by mouth every evening.     MAGNESIUM GLYCINATE PO Take 1 capsule by mouth daily.     montelukast (SINGULAIR) 10 MG tablet Take 10 mg by mouth at bedtime.     Multiple Vitamin (MULTIVITAMIN) tablet Take 1 tablet by mouth daily.     sertraline (ZOLOFT) 100 MG tablet Take 200 mg by mouth daily.     Turmeric (QC TUMERIC COMPLEX PO) Take by mouth.     No current facility-administered medications for this visit.    Drug-Drug Interactions (DDIs) DDIs were evaluated?  Yes Significant DDIs?  We have discussed the potential interaction of serotonin syndrome in the past.  No symptoms at this time. The patient was instructed to speak with their health care provider and/or the oral chemotherapy pharmacist before starting any new drug, including prescription or over the counter, natural / herbal products, or vitamins.  Supportive Care Diarrhea: we reviewed that diarrhea is common with abemaciclib and confirmed that she does have loperamide (Imodium) at home.  We reviewed how to take this medication PRN. Neutropenia: we discussed the importance of having a thermometer and what the Centers for Disease Control and Prevention (CDC) considers a fever which is  100.62F (38C) or higher.  Gave patient 24/7 triage line to call if any fevers or symptoms. ILD/Pneumonitis: we reviewed potential symptoms including cough, shortness, and fatigue.  VTE: reviewed signs of DVT such as leg swelling, redness, pain, or tenderness and signs of PE such as shortness of breath, rapid or irregular heartbeat, cough, chest pain, or lightheadedness. Reviewed to take the medication every 12 hours (with food sometimes can be easier on the stomach) and to take it at the same time every day.  Dosing Assessment Hepatic adjustments needed?  No Renal adjustments needed?  No Toxicity adjustments needed?  No The current dosing regimen is appropriate to continue at this time.  Follow-Up Plan Continue abemaciclib 150 mg by mouth every 12 hours.  She continues to receive this from [facility] with no issues. Continue to resolve 2.5 mg by mouth daily We will likely start denosumab 120 mg at her next visit with Dr. Lindi Adie which is scheduled for 05/28/22.  If Dr. Lindi Adie approves, this order just needs to be signed Continue loperamide as needed for diarrhea.  Consider taking loperamide 1 tablet every morning. Use antinausea meds as needed. Restaging scans set for 05/14/22.  Ms. Dolin knows that Dr. Lindi Adie can review these results sooner than her next visit with him if she desires. If Ms. Saefong continues to tolerate abemaciclib well and scans do not show progression, she can likely be seen every 12 weeks with labs to coincide with her denosumab 120 mg injections which will likely start on 05/28/22 We  will add labs, pharmacy clinic visit, denosumab 120 mg, on 08/20/22  Linda Gutierrez participated in the discussion, expressed understanding, and voiced agreement with the above plan. All questions were answered to her satisfaction. The patient was advised to contact the clinic at [phone] with any questions or concerns prior to her return visit.   I spent 30 minutes  assessing and educating the patient.  Raina Mina, RPH-CPP, 04/03/2022  1:28 PM   **Disclaimer: This note was dictated with voice recognition software. Similar sounding words can inadvertently be transcribed and this note may contain transcription errors which may not have been corrected upon publication of note.**

## 2022-04-08 ENCOUNTER — Encounter: Payer: Self-pay | Admitting: *Deleted

## 2022-04-08 ENCOUNTER — Encounter: Payer: Self-pay | Admitting: Hematology and Oncology

## 2022-04-08 NOTE — Progress Notes (Signed)
Received medical clearance request from Dr. Rolena Infante with [facility] for pt to undergo Kyphoplasty.  Per MD pt needing to hold Verzenio 48 hours to procedure and resume day after procedure.  RN successfully faxed signed clearance [phone]).

## 2022-04-10 ENCOUNTER — Encounter: Payer: Self-pay | Admitting: Hematology and Oncology

## 2022-04-11 ENCOUNTER — Ambulatory Visit (HOSPITAL_COMMUNITY): Payer: Self-pay | Admitting: Orthopedic Surgery

## 2022-04-11 MED ORDER — ABEMACICLIB 100 MG PO TABS: 100.0000 mg | ORAL_TABLET | Freq: Two times a day (BID) | ORAL | 3 refills | Status: DC

## 2022-04-18 NOTE — Pre-Procedure Instructions (Signed)
Surgical Instructions    Your procedure is scheduled on April 28, 2022.  Report to [facility] Main Entrance "A" at 9:30 A.M., then check in with the Admitting office.  Call this number if you have problems the morning of surgery:  [phone]   If you have any questions prior to your surgery date call [phone]: Open Monday-Friday 8am-4pm    Remember:  Do not eat after midnight the night before your surgery  You may drink clear liquids until 8:30 AM the morning of your surgery.   Clear liquids allowed are: Water, Non-Citrus Juices (without pulp), Carbonated Beverages, Clear Tea, Black Coffee Only (NO MILK, CREAM OR POWDERED CREAMER of any kind), and Gatorade.     Take these medicines the morning of surgery with A SIP OF WATER:  BREO ELLIPTA   letrozole (FEMARA)   sertraline (ZOLOFT)   abemaciclib (VERZENIO)   busPIRone (BUSPAR) - may take if needed   As of today, STOP taking any Aspirin (unless otherwise instructed by your surgeon) Aleve, Naproxen, Ibuprofen, Motrin, Advil, Goody's, BC's, all herbal medications, fish oil, and all vitamins.                     Do NOT Smoke (Tobacco/Vaping) for 24 hours prior to your procedure.  If you use a CPAP at night, you may bring your mask/headgear for your overnight stay.   Contacts, glasses, piercing's, hearing aid's, dentures or partials may not be worn into surgery, please bring cases for these belongings.    For patients admitted to the hospital, discharge time will be determined by your treatment team.   Patients discharged the day of surgery will not be allowed to drive home, and someone needs to stay with them for 24 hours.  SURGICAL WAITING ROOM VISITATION Patients having surgery or a procedure may have no more than 2 support people in the waiting area - these visitors may rotate.   Children under the age of 43 must have an adult with them who is not the patient. If the patient needs to stay at the hospital during part  of their recovery, the visitor guidelines for inpatient rooms apply. Pre-op nurse will coordinate an appropriate time for 1 support person to accompany patient in pre-op.  This support person may not rotate.   Please refer to the [facility] website for the visitor guidelines for Inpatients (after your surgery is over and you are in a regular room).    Special instructions:   [place]- Preparing For Surgery  Before surgery, you can play an important role. Because skin is not sterile, your skin needs to be as free of germs as possible. You can reduce the number of germs on your skin by washing with CHG (chlorahexidine gluconate) Soap before surgery.  CHG is an antiseptic cleaner which kills germs and bonds with the skin to continue killing germs even after washing.    Oral Hygiene is also important to reduce your risk of infection.  Remember - BRUSH YOUR TEETH THE MORNING OF SURGERY WITH YOUR REGULAR TOOTHPASTE  Please do not use if you have an allergy to CHG or antibacterial soaps. If your skin becomes reddened/irritated stop using the CHG.  Do not shave (including legs and underarms) for at least 48 hours prior to first CHG shower. It is OK to shave your face.  Please follow these instructions carefully.   Shower the NIGHT BEFORE SURGERY and the MORNING OF SURGERY  If you chose to wash your  hair, wash your hair first as usual with your normal shampoo.  After you shampoo, rinse your hair and body thoroughly to remove the shampoo.  Use CHG Soap as you would any other liquid soap. You can apply CHG directly to the skin and wash gently with a scrungie or a clean washcloth.   Apply the CHG Soap to your body ONLY FROM THE NECK DOWN.  Do not use on open wounds or open sores. Avoid contact with your eyes, ears, mouth and genitals (private parts). Wash Face and genitals (private parts)  with your normal soap.   Wash thoroughly, paying special attention to the area where your surgery will be  performed.  Thoroughly rinse your body with warm water from the neck down.  DO NOT shower/wash with your normal soap after using and rinsing off the CHG Soap.  Pat yourself dry with a CLEAN TOWEL.  Wear CLEAN PAJAMAS to bed the night before surgery  Place CLEAN SHEETS on your bed the night before your surgery  DO NOT SLEEP WITH PETS.   Day of Surgery: Take a shower with CHG soap. Do not wear jewelry or makeup Do not wear lotions, powders, perfumes/colognes, or deodorant. Do not shave 48 hours prior to surgery.  Men may shave face and neck. Do not bring valuables to the hospital.  [facility] is not responsible for any belongings or valuables. Do not wear nail polish, gel polish, artificial nails, or any other type of covering on natural nails (fingers and toes) If you have artificial nails or gel coating that need to be removed by a nail salon, please have this removed prior to surgery. Artificial nails or gel coating may interfere with anesthesia's ability to adequately monitor your vital signs.  Wear Clean/Comfortable clothing the morning of surgery Remember to brush your teeth WITH YOUR REGULAR TOOTHPASTE.   Please read over the following fact sheets that you were given.    If you received a COVID test during your pre-op visit  it is requested that you wear a mask when out in public, stay away from anyone that may not be feeling well and notify your surgeon if you develop symptoms. If you have been in contact with anyone that has tested positive in the last 10 days please notify you surgeon.

## 2022-04-21 ENCOUNTER — Encounter (HOSPITAL_COMMUNITY)
Admission: RE | Admit: 2022-04-21 | Discharge: 2022-04-21 | Disposition: A | Discharge status: Home / Self Care | Payer: 59 | Source: Ambulatory Visit | Attending: Orthopedic Surgery | Admitting: Orthopedic Surgery

## 2022-04-21 ENCOUNTER — Encounter (HOSPITAL_COMMUNITY): Payer: Self-pay

## 2022-04-21 ENCOUNTER — Other Ambulatory Visit: Payer: Self-pay

## 2022-04-21 VITALS — BP 144/95 | HR 86 | Temp 98.7°F | Resp 18 | Ht 68.0 in | Wt 241.8 lb

## 2022-04-21 DIAGNOSIS — Z01818 Encounter for other preprocedural examination: Secondary | ICD-10-CM | POA: Diagnosis present

## 2022-04-21 HISTORY — DX: Anxiety disorder, unspecified: F41.9

## 2022-04-21 HISTORY — DX: Malignant (primary) neoplasm, unspecified: C80.1

## 2022-04-21 LAB — SURGICAL PCR SCREEN
MRSA, PCR: NEGATIVE
Staphylococcus aureus: POSITIVE — AB

## 2022-04-21 NOTE — Progress Notes (Signed)
PCP - Janie Morning Cardiologist - debies Oncologist- Dr Lindi Adie  PPM/ICD - denies  Chest x-ray - N/A EKG - N/A- pt reports having EKG done last month at her PCP- records requested Stress Test - denies ECHO - denies Cardiac Cath - denies  Sleep Study -  N/A   Fasting Blood Sugar - N/A   Blood Thinner Instructions:N/A Aspirin Instructions: N/A  Verzenio- Pt reports she was instructed by her oncologist to hold this medication the day before and day of surgery.   ERAS Protcol -ERAS per protocol  Pt reports she still has a scab at her umbilical incision from September. Pt states a couple weeks ago that there was some drainage to this site. No drainage noted at PAT appointment. Scab is pink. Pt states she will call her GYN surgeon to notify of this site to see if she needs to be seen about this prior to her back surgery.   COVID TEST- N/A- pt reports she is not having any new COVID symptoms. Pt state she has been having allergies for weeks and has been using her BREO Ellipta inhaler. Pt states if she begins feeling sick or having cold/flu/Covid like symptoms then she will get tested and call.    Anesthesia review: review requested records- EKG and medical clearance from PCP.   Patient denies shortness of breath, fever, cough and chest pain at PAT appointment   All instructions explained to the patient, with a verbal understanding of the material. Patient agrees to go over the instructions while at home for a better understanding. The opportunity to ask questions was provided.

## 2022-04-21 NOTE — Pre-Procedure Instructions (Addendum)
Surgical Instructions    Your procedure is scheduled on April 28, 2022.  Report to [facility] Main Entrance "A" at 9:30 A.M., then check in with the Admitting office.  Call this number if you have problems the morning of surgery:  [phone]   If you have any questions prior to your surgery date call [phone]: Open Monday-Friday 8am-4pm    Remember:  Do not eat after midnight the night before your surgery  You may drink clear liquids until 8:30 AM the morning of your surgery.   Clear liquids allowed are: Water, Non-Citrus Juices (without pulp), Carbonated Beverages, Clear Tea, Black Coffee Only (NO MILK, CREAM OR POWDERED CREAMER of any kind), and Gatorade.     Take these medicines the morning of surgery with A SIP OF WATER:  BREO ELLIPTA   letrozole (FEMARA)   sertraline (ZOLOFT)    busPIRone (BUSPAR) - may take if needed   As of today, STOP taking any Aspirin (unless otherwise instructed by your surgeon) Aleve, Naproxen, Ibuprofen, Motrin, Advil, Goody's, BC's, all herbal medications, fish oil, and all vitamins.                     Do NOT Smoke (Tobacco/Vaping) for 24 hours prior to your procedure.  If you use a CPAP at night, you may bring your mask/headgear for your overnight stay.   Contacts, glasses, piercing's, hearing aid's, dentures or partials may not be worn into surgery, please bring cases for these belongings.    For patients admitted to the hospital, discharge time will be determined by your treatment team.   Patients discharged the day of surgery will not be allowed to drive home, and someone needs to stay with them for 24 hours.  SURGICAL WAITING ROOM VISITATION Patients having surgery or a procedure may have no more than 2 support people in the waiting area - these visitors may rotate.   Children under the age of 11 must have an adult with them who is not the patient. If the patient needs to stay at the hospital during part of their recovery, the  visitor guidelines for inpatient rooms apply. Pre-op nurse will coordinate an appropriate time for 1 support person to accompany patient in pre-op.  This support person may not rotate.   Please refer to the [facility] website for the visitor guidelines for Inpatients (after your surgery is over and you are in a regular room).    Special instructions:   [place]- Preparing For Surgery  Before surgery, you can play an important role. Because skin is not sterile, your skin needs to be as free of germs as possible. You can reduce the number of germs on your skin by washing with CHG (chlorahexidine gluconate) Soap before surgery.  CHG is an antiseptic cleaner which kills germs and bonds with the skin to continue killing germs even after washing.    Oral Hygiene is also important to reduce your risk of infection.  Remember - BRUSH YOUR TEETH THE MORNING OF SURGERY WITH YOUR REGULAR TOOTHPASTE  Please do not use if you have an allergy to CHG or antibacterial soaps. If your skin becomes reddened/irritated stop using the CHG.  Do not shave (including legs and underarms) for at least 48 hours prior to first CHG shower. It is OK to shave your face.  Please follow these instructions carefully.   Shower the NIGHT BEFORE SURGERY and the MORNING OF SURGERY  If you chose to wash your hair, wash your  hair first as usual with your normal shampoo.  After you shampoo, rinse your hair and body thoroughly to remove the shampoo.  Use CHG Soap as you would any other liquid soap. You can apply CHG directly to the skin and wash gently with a scrungie or a clean washcloth.   Apply the CHG Soap to your body ONLY FROM THE NECK DOWN.  Do not use on open wounds or open sores. Avoid contact with your eyes, ears, mouth and genitals (private parts). Wash Face and genitals (private parts)  with your normal soap.   Wash thoroughly, paying special attention to the area where your surgery will be performed.  Thoroughly  rinse your body with warm water from the neck down.  DO NOT shower/wash with your normal soap after using and rinsing off the CHG Soap.  Pat yourself dry with a CLEAN TOWEL.  Wear CLEAN PAJAMAS to bed the night before surgery  Place CLEAN SHEETS on your bed the night before your surgery  DO NOT SLEEP WITH PETS.   Day of Surgery: Take a shower with CHG soap. Do not wear jewelry or makeup Do not wear lotions, powders, perfumes/colognes, or deodorant. Do not shave 48 hours prior to surgery.  Men may shave face and neck. Do not bring valuables to the hospital.  [facility] is not responsible for any belongings or valuables. Do not wear nail polish, gel polish, artificial nails, or any other type of covering on natural nails (fingers and toes) If you have artificial nails or gel coating that need to be removed by a nail salon, please have this removed prior to surgery. Artificial nails or gel coating may interfere with anesthesia's ability to adequately monitor your vital signs.  Wear Clean/Comfortable clothing the morning of surgery Remember to brush your teeth WITH YOUR REGULAR TOOTHPASTE.   Please read over the following fact sheets that you were given.    If you received a COVID test during your pre-op visit  it is requested that you wear a mask when out in public, stay away from anyone that may not be feeling well and notify your surgeon if you develop symptoms. If you have been in contact with anyone that has tested positive in the last 10 days please notify you surgeon.

## 2022-04-22 NOTE — Progress Notes (Addendum)
Anesthesia Chart Review:  Case: [id number] Date/Time: 04/28/22 1115   Procedure: L4 BIOPSY AND KYPHOPLASTY - 90 mins   Anesthesia type: General   Pre-op diagnosis: L4 pathologic compression fracture   Location: [facility] ROOM 04 / [facility]   Surgeons: Melina Schools, MD       DISCUSSION: Patient is a 46 year old female scheduled for the above procedure.  History includes former smoker (quit 06/09/13), asthma, GERD, hiatal hernia, right breast cancer (+CHEK2 gene "stage III" right breast cancer 10/2013, s/p chemotherapy, s/p bilateral mastectomies with reconstruction, s/p Taxol and tamoxifen; recurrence 12/18/21, 12/25/21 PET scan + mets to chest, liver, bone, s/p radiation 12/2721-02/16/22 L4 lytic lesion), hysterectomy (2017; therapeutic BSO 02/11/22 with incidental small serous tumor), anxiety.  Dr. Lindi Adie cleared from an oncology standpoint and advised she "stop the Verzenio 48 hours prior to the surgery, nothing the day of surgery, and resume the day after."   On 04/07/22, PCP Dr. Theda Sers signed a letter of medical clearance for surgery. Patient believes she may have had an EKG done as part of her preoperative visit, so records requested but are pending.   Anesthesia team to evaluate on the day of surgery. RUE restricted.    VS: BP (!) 144/95   Pulse 86   Temp 37.1 C (Oral)   Resp 18   Ht _0  (1.727 m)   Wt 109.7 kg   SpO2 97%   BMI 36.77 kg/m    PROVIDERS: Janie Morning, DO is PCP  Lafonda Mosses, MD is Sharion Balloon, MD is HEM-ONC Eppie Gibson, MD is RAD-ONC   LABS: Most recent labs in [facility] include: Lab Results  Component Value Date   WBC 5.1 04/03/2022   HGB 13.4 04/03/2022   HCT 36.8 04/03/2022   PLT 239 04/03/2022   GLUCOSE 96 04/03/2022   ALT 15 04/03/2022   AST 16 04/03/2022   NA 141 04/03/2022   K 4.5 04/03/2022   CL 107 04/03/2022   CREATININE 0.93 04/03/2022   BUN 11 04/03/2022   CO2 28 04/03/2022   TSH 2.64 07/27/2020   HGBA1C 5.1 02/25/2018     IMAGES: CT hip right 02/07/22: IMPRESSION: - Lytic lesions in the superior and posterior walls of the right acetabulum. There is a cortical break and buckling of cortex at the articular surface about the lesion in the posterior wall of the acetabulum. - Negative for hip fracture. - Small sclerotic lesions in the left parasymphyseal pubic bone and high right superior pubic ramus may be old treated metastases.  CT T-spine 02/07/22: IMPRESSION: 1. Unchanged lytic lesion within the posterior elements on the right at L87, hypermetabolic on recent PET-CT and consistent with metastatic disease. 2. No evidence of pathologic fracture, epidural or paraspinal tumor. 3. Partially imaged known multifocal hepatic metastatic disease. 4. Minimal spondylosis without evidence of spinal stenosis.   PET Scan 12/25/21: IMPRESSION: 1. Small hypermetabolic right chest wall nodules and hypermetabolic right axillary adenopathy consistent with recurrent breast cancer. 2. Numerous large hepatic metastatic lesions. 3. Small scattered pulmonary nodules most likely pulmonary metastatic disease. 4. Diffuse lytic/destructive osseous metastatic disease. The most critical lesion involves the right aspect of L4. Probable pathologic fracture but no canal involvement.   US Thyroid 06/19/20: IMPRESSION: Normal study.   EKG: UPDATE: 04/14/22 6:12 PM:  04/03/22 EKG tracing received from Dr. Theda Sers and showed: SR at 82 bpm, RSR (V1), nondiagnostic.    CV: N/A  Past Medical History:  Diagnosis Date   Abnormal Pap smear  of cervix 2016   HPV   Anxiety    Arthritis    Asthma    Breast cancer ([place]) 2015   rt side   Cancer ([place])    metestatic- on liver   Depression    GERD (gastroesophageal reflux disease)    History of hiatal hernia    Pneumonia    STD (sexually transmitted disease)    HPV   Urinary incontinence     Past Surgical History:  Procedure Laterality Date   BREAST RECONSTRUCTION      BREAST RECONSTRUCTION     with implants   CERVICAL BIOPSY  W/ LOOP ELECTRODE EXCISION     COLPOSCOPY     MASTECTOMY Bilateral 2017   ROBOTIC ASSISTED BILATERAL SALPINGO OOPHERECTOMY Bilateral 02/11/2022   Procedure: XI ROBOTIC ASSISTED BILATERAL SALPINGO OOPHORECTOMY;  Surgeon: Lafonda Mosses, MD;  Location: [facility] ORS;  Service: Gynecology;  Laterality: Bilateral;   TOTAL VAGINAL HYSTERECTOMY      MEDICATIONS:  abemaciclib (VERZENIO) 100 MG tablet   BREO ELLIPTA 200-25 MCG/ACT AEPB   busPIRone (BUSPAR) 15 MG tablet   calcium carbonate (OS-CAL - DOSED IN MG OF ELEMENTAL CALCIUM) 1250 (500 Ca) MG tablet   cholecalciferol (VITAMIN D3) 25 MCG (1000 UNIT) tablet   CLINPRO 5000 1.1 % PSTE   letrozole (FEMARA) 2.5 MG tablet   levocetirizine (XYZAL) 5 MG tablet   montelukast (SINGULAIR) 10 MG tablet   sertraline (ZOLOFT) 100 MG tablet   No current facility-administered medications for this encounter.    Linda Gianotti, PA-C Surgical Short Stay/Anesthesiology [facility] Phone [phone] [facility] Phone [phone] 04/23/2022 12:34 PM

## 2022-04-23 NOTE — Anesthesia Preprocedure Evaluation (Addendum)
Anesthesia Evaluation  Patient identified by MRN, date of birth, ID band Patient awake    Reviewed: Allergy & Precautions, NPO status , Patient's Chart, lab work & pertinent test results  Airway Mallampati: II  TM Distance: >3 FB Neck ROM: Full    Dental  (+) Teeth Intact, Dental Advisory Given   Pulmonary asthma , former smoker   breath sounds clear to auscultation       Cardiovascular negative cardio ROS  Rhythm:Regular Rate:Normal     Neuro/Psych  PSYCHIATRIC DISORDERS Anxiety Depression       GI/Hepatic hiatal hernia,GERD  Medicated and Controlled,,  Endo/Other    Renal/GU      Musculoskeletal  (+) Arthritis ,    Abdominal   Peds  Hematology negative hematology ROS (+)   Anesthesia Other Findings   Reproductive/Obstetrics                             Anesthesia Physical Anesthesia Plan  ASA: 2  Anesthesia Plan: MAC   Post-op Pain Management:    Induction: Intravenous  PONV Risk Score and Plan: 0 and Propofol infusion and Ondansetron  Airway Management Planned: Natural Airway and Simple Face Mask  Additional Equipment: None  Intra-op Plan:   Post-operative Plan:   Informed Consent: I have reviewed the patients History and Physical, chart, labs and discussed the procedure including the risks, benefits and alternatives for the proposed anesthesia with the patient or authorized representative who has indicated his/her understanding and acceptance.     Dental advisory given  Plan Discussed with: CRNA  Anesthesia Plan Comments: (PAT note written 04/23/2022 by Myra Gianotti, PA-C. RUE restricted.   Consented for GA if needed. )       Anesthesia Quick Evaluation

## 2022-04-28 ENCOUNTER — Encounter (HOSPITAL_COMMUNITY)
Admission: RE | Disposition: A | Discharge status: Home / Self Care | Payer: Self-pay | Source: Ambulatory Visit | Attending: Orthopedic Surgery

## 2022-04-28 ENCOUNTER — Other Ambulatory Visit: Payer: Self-pay

## 2022-04-28 ENCOUNTER — Observation Stay (HOSPITAL_COMMUNITY)
Admission: RE | Admit: 2022-04-28 | Discharge: 2022-04-28 | Disposition: A | Discharge status: Home / Self Care | Payer: 59 | Source: Ambulatory Visit | Attending: Orthopedic Surgery | Admitting: Orthopedic Surgery

## 2022-04-28 ENCOUNTER — Ambulatory Visit (HOSPITAL_COMMUNITY): Payer: 59 | Admitting: Vascular Surgery

## 2022-04-28 ENCOUNTER — Encounter (HOSPITAL_COMMUNITY): Payer: Self-pay | Admitting: Orthopedic Surgery

## 2022-04-28 ENCOUNTER — Ambulatory Visit (HOSPITAL_BASED_OUTPATIENT_CLINIC_OR_DEPARTMENT_OTHER): Payer: 59 | Admitting: Certified Registered Nurse Anesthetist

## 2022-04-28 ENCOUNTER — Ambulatory Visit (HOSPITAL_COMMUNITY): Payer: 59

## 2022-04-28 DIAGNOSIS — Z853 Personal history of malignant neoplasm of breast: Secondary | ICD-10-CM | POA: Diagnosis not present

## 2022-04-28 DIAGNOSIS — Z79899 Other long term (current) drug therapy: Secondary | ICD-10-CM | POA: Insufficient documentation

## 2022-04-28 DIAGNOSIS — Z8505 Personal history of malignant neoplasm of liver: Secondary | ICD-10-CM | POA: Insufficient documentation

## 2022-04-28 DIAGNOSIS — J45909 Unspecified asthma, uncomplicated: Secondary | ICD-10-CM | POA: Insufficient documentation

## 2022-04-28 DIAGNOSIS — M4856XA Collapsed vertebra, not elsewhere classified, lumbar region, initial encounter for fracture: Secondary | ICD-10-CM

## 2022-04-28 DIAGNOSIS — S32000A Wedge compression fracture of unspecified lumbar vertebra, initial encounter for closed fracture: Secondary | ICD-10-CM | POA: Diagnosis present

## 2022-04-28 DIAGNOSIS — Z87891 Personal history of nicotine dependence: Secondary | ICD-10-CM

## 2022-04-28 DIAGNOSIS — F418 Other specified anxiety disorders: Secondary | ICD-10-CM | POA: Diagnosis not present

## 2022-04-28 DIAGNOSIS — M8448XA Pathological fracture, other site, initial encounter for fracture: Secondary | ICD-10-CM | POA: Diagnosis present

## 2022-04-28 HISTORY — PX: KYPHOPLASTY: SHX5884

## 2022-04-28 SURGERY — KYPHOPLASTY
Anesthesia: Monitor Anesthesia Care

## 2022-04-28 MED ORDER — POLYETHYLENE GLYCOL 3350 17 G PO PACK: 17.0000 g | PACK | Freq: Every day | ORAL | Status: DC | PRN

## 2022-04-28 MED ORDER — SODIUM CHLORIDE 0.9 % IV SOLN: 250.0000 mL | INTRAVENOUS | Status: DC

## 2022-04-28 MED ORDER — LIDOCAINE 2% (20 MG/ML) 5 ML SYRINGE
INTRAMUSCULAR | Status: AC
  Filled 2022-04-28: qty 5

## 2022-04-28 MED ORDER — LETROZOLE 2.5 MG PO TABS: 2.5000 mg | ORAL_TABLET | Freq: Every day | ORAL | Status: DC

## 2022-04-28 MED ORDER — ONDANSETRON HCL 4 MG/2ML IJ SOLN
INTRAMUSCULAR | Status: AC
  Filled 2022-04-28: qty 2

## 2022-04-28 MED ORDER — SUGAMMADEX SODIUM 200 MG/2ML IV SOLN
INTRAVENOUS | Status: DC | PRN
  Administered 2022-04-28: 217.8 mg via INTRAVENOUS

## 2022-04-28 MED ORDER — ABEMACICLIB 100 MG PO TABS: 100.0000 mg | ORAL_TABLET | Freq: Two times a day (BID) | ORAL | Status: DC

## 2022-04-28 MED ORDER — ONDANSETRON HCL 4 MG PO TABS: 4.0000 mg | ORAL_TABLET | Freq: Four times a day (QID) | ORAL | Status: DC | PRN

## 2022-04-28 MED ORDER — PROPOFOL 10 MG/ML IV BOLUS
INTRAVENOUS | Status: DC | PRN
  Administered 2022-04-28 (×2): 30 mg via INTRAVENOUS

## 2022-04-28 MED ORDER — OXYCODONE HCL 5 MG/5ML PO SOLN: 5.0000 mg | Freq: Once | ORAL | Status: DC | PRN

## 2022-04-28 MED ORDER — FENTANYL CITRATE (PF) 100 MCG/2ML IJ SOLN: 25.0000 ug | INTRAMUSCULAR | Status: DC | PRN

## 2022-04-28 MED ORDER — ONDANSETRON HCL 4 MG PO TABS: 4.0000 mg | ORAL_TABLET | Freq: Three times a day (TID) | ORAL | 0 refills | Status: DC | PRN

## 2022-04-28 MED ORDER — OXYCODONE-ACETAMINOPHEN 10-325 MG PO TABS: 1.0000 | ORAL_TABLET | Freq: Four times a day (QID) | ORAL | 0 refills | Status: AC | PRN

## 2022-04-28 MED ORDER — ACETAMINOPHEN 10 MG/ML IV SOLN: 1000.0000 mg | Freq: Once | INTRAVENOUS | Status: DC | PRN

## 2022-04-28 MED ORDER — PROMETHAZINE HCL 25 MG/ML IJ SOLN: 6.2500 mg | INTRAMUSCULAR | Status: DC | PRN

## 2022-04-28 MED ORDER — SODIUM CHLORIDE 0.9% FLUSH: 3.0000 mL | Freq: Two times a day (BID) | INTRAVENOUS | Status: DC

## 2022-04-28 MED ORDER — SERTRALINE HCL 50 MG PO TABS: 200.0000 mg | ORAL_TABLET | Freq: Every day | ORAL | Status: DC

## 2022-04-28 MED ORDER — CEFAZOLIN SODIUM-DEXTROSE 2-4 GM/100ML-% IV SOLN
2.0000 g | INTRAVENOUS | Status: AC
  Administered 2022-04-28: 2 g via INTRAVENOUS

## 2022-04-28 MED ORDER — OXYCODONE HCL 5 MG PO TABS: 10.0000 mg | ORAL_TABLET | ORAL | Status: DC | PRN

## 2022-04-28 MED ORDER — SUCCINYLCHOLINE CHLORIDE 200 MG/10ML IV SOSY
PREFILLED_SYRINGE | INTRAVENOUS | Status: AC
  Filled 2022-04-28: qty 10

## 2022-04-28 MED ORDER — BUPIVACAINE-EPINEPHRINE (PF) 0.25% -1:200000 IJ SOLN
INTRAMUSCULAR | Status: DC | PRN
  Administered 2022-04-28: 50 mL via INTRAMUSCULAR

## 2022-04-28 MED ORDER — METHOCARBAMOL 500 MG PO TABS
500.0000 mg | ORAL_TABLET | Freq: Four times a day (QID) | ORAL | Status: DC | PRN
  Administered 2022-04-28: 500 mg via ORAL
  Filled 2022-04-28: qty 1

## 2022-04-28 MED ORDER — ORAL CARE MOUTH RINSE: 15.0000 mL | Freq: Once | OROMUCOSAL | Status: AC

## 2022-04-28 MED ORDER — ROCURONIUM BROMIDE 100 MG/10ML IV SOLN
INTRAVENOUS | Status: DC | PRN
  Administered 2022-04-28: 100 mg via INTRAVENOUS

## 2022-04-28 MED ORDER — BUPIVACAINE LIPOSOME 1.3 % IJ SUSP
INTRAMUSCULAR | Status: AC
  Filled 2022-04-28: qty 20

## 2022-04-28 MED ORDER — LACTATED RINGERS IV SOLN: INTRAVENOUS | Status: DC

## 2022-04-28 MED ORDER — ACETAMINOPHEN 650 MG RE SUPP: 650.0000 mg | RECTAL | Status: DC | PRN

## 2022-04-28 MED ORDER — BUSPIRONE HCL 15 MG PO TABS
15.0000 mg | ORAL_TABLET | Freq: Two times a day (BID) | ORAL | Status: DC
  Administered 2022-04-28: 15 mg via ORAL
  Filled 2022-04-28: qty 1

## 2022-04-28 MED ORDER — PHENOL 1.4 % MT LIQD: 1.0000 | OROMUCOSAL | Status: DC | PRN

## 2022-04-28 MED ORDER — KETAMINE HCL 50 MG/5ML IJ SOSY
PREFILLED_SYRINGE | INTRAMUSCULAR | Status: AC
  Filled 2022-04-28: qty 5

## 2022-04-28 MED ORDER — LORATADINE 10 MG PO TABS
10.0000 mg | ORAL_TABLET | Freq: Every evening | ORAL | Status: DC
  Administered 2022-04-28: 10 mg via ORAL
  Filled 2022-04-28: qty 1

## 2022-04-28 MED ORDER — ACETAMINOPHEN 10 MG/ML IV SOLN
INTRAVENOUS | Status: DC | PRN
  Administered 2022-04-28: 1000 mg via INTRAVENOUS

## 2022-04-28 MED ORDER — ACETAMINOPHEN 10 MG/ML IV SOLN
INTRAVENOUS | Status: AC
  Filled 2022-04-28: qty 100

## 2022-04-28 MED ORDER — CHLORHEXIDINE GLUCONATE 0.12 % MT SOLN
OROMUCOSAL | Status: AC
  Administered 2022-04-28: 15 mL via OROMUCOSAL
  Filled 2022-04-28: qty 15

## 2022-04-28 MED ORDER — MIDAZOLAM HCL 2 MG/2ML IJ SOLN
INTRAMUSCULAR | Status: AC
  Filled 2022-04-28: qty 2

## 2022-04-28 MED ORDER — SODIUM CHLORIDE 0.9% FLUSH: 3.0000 mL | INTRAVENOUS | Status: DC | PRN

## 2022-04-28 MED ORDER — ONDANSETRON HCL 4 MG/2ML IJ SOLN
INTRAMUSCULAR | Status: DC | PRN
  Administered 2022-04-28: 4 mg via INTRAVENOUS

## 2022-04-28 MED ORDER — PROPOFOL 500 MG/50ML IV EMUL
INTRAVENOUS | Status: DC | PRN
  Administered 2022-04-28: 75 ug/kg/min via INTRAVENOUS

## 2022-04-28 MED ORDER — AMISULPRIDE (ANTIEMETIC) 5 MG/2ML IV SOLN: 10.0000 mg | Freq: Once | INTRAVENOUS | Status: DC | PRN

## 2022-04-28 MED ORDER — METHOCARBAMOL 1000 MG/10ML IJ SOLN: 500.0000 mg | Freq: Four times a day (QID) | INTRAVENOUS | Status: DC | PRN

## 2022-04-28 MED ORDER — PROPOFOL 1000 MG/100ML IV EMUL
INTRAVENOUS | Status: AC
  Filled 2022-04-28: qty 100

## 2022-04-28 MED ORDER — OXYCODONE HCL 5 MG PO TABS
5.0000 mg | ORAL_TABLET | ORAL | Status: DC | PRN
  Administered 2022-04-28: 5 mg via ORAL
  Filled 2022-04-28: qty 1

## 2022-04-28 MED ORDER — SENNOSIDES-DOCUSATE SODIUM 8.6-50 MG PO TABS
1.0000 | ORAL_TABLET | Freq: Two times a day (BID) | ORAL | Status: DC
  Filled 2022-04-28: qty 1

## 2022-04-28 MED ORDER — KETAMINE HCL 10 MG/ML IJ SOLN
INTRAMUSCULAR | Status: DC | PRN
  Administered 2022-04-28 (×2): 10 mg via INTRAVENOUS

## 2022-04-28 MED ORDER — CHLORHEXIDINE GLUCONATE 0.12 % MT SOLN: 15.0000 mL | Freq: Once | OROMUCOSAL | Status: AC

## 2022-04-28 MED ORDER — IOHEXOL 300 MG/ML  SOLN
INTRAMUSCULAR | Status: DC | PRN
  Administered 2022-04-28: 20 mL

## 2022-04-28 MED ORDER — MIDAZOLAM HCL 2 MG/2ML IJ SOLN
INTRAMUSCULAR | Status: DC | PRN
  Administered 2022-04-28: 2 mg via INTRAVENOUS

## 2022-04-28 MED ORDER — METHOCARBAMOL 500 MG PO TABS: 500.0000 mg | ORAL_TABLET | Freq: Three times a day (TID) | ORAL | 0 refills | Status: AC | PRN

## 2022-04-28 MED ORDER — ACETAMINOPHEN 160 MG/5ML PO SOLN: 325.0000 mg | ORAL | Status: DC | PRN

## 2022-04-28 MED ORDER — MENTHOL 3 MG MT LOZG: 1.0000 | LOZENGE | OROMUCOSAL | Status: DC | PRN

## 2022-04-28 MED ORDER — BUPIVACAINE-EPINEPHRINE (PF) 0.25% -1:200000 IJ SOLN
INTRAMUSCULAR | Status: AC
  Filled 2022-04-28: qty 30

## 2022-04-28 MED ORDER — CEFAZOLIN SODIUM-DEXTROSE 2-4 GM/100ML-% IV SOLN
INTRAVENOUS | Status: AC
  Filled 2022-04-28: qty 100

## 2022-04-28 MED ORDER — CEFAZOLIN SODIUM-DEXTROSE 1-4 GM/50ML-% IV SOLN: 1.0000 g | Freq: Three times a day (TID) | INTRAVENOUS | Status: DC

## 2022-04-28 MED ORDER — MAGNESIUM CITRATE PO SOLN: 1.0000 | Freq: Once | ORAL | Status: DC | PRN

## 2022-04-28 MED ORDER — ACETAMINOPHEN 325 MG PO TABS: 650.0000 mg | ORAL_TABLET | ORAL | Status: DC | PRN

## 2022-04-28 MED ORDER — DEXMEDETOMIDINE HCL IN NACL 80 MCG/20ML IV SOLN
INTRAVENOUS | Status: DC | PRN
  Administered 2022-04-28: 8 ug via BUCCAL
  Administered 2022-04-28: 12 ug via BUCCAL

## 2022-04-28 MED ORDER — ONDANSETRON HCL 4 MG/2ML IJ SOLN: 4.0000 mg | Freq: Four times a day (QID) | INTRAMUSCULAR | Status: DC | PRN

## 2022-04-28 MED ORDER — ACETAMINOPHEN 325 MG PO TABS: 325.0000 mg | ORAL_TABLET | ORAL | Status: DC | PRN

## 2022-04-28 MED ORDER — LIDOCAINE 2% (20 MG/ML) 5 ML SYRINGE
INTRAMUSCULAR | Status: DC | PRN
  Administered 2022-04-28: 40 mg via INTRAVENOUS
  Administered 2022-04-28: 20 mg via INTRAVENOUS

## 2022-04-28 MED ORDER — 0.9 % SODIUM CHLORIDE (POUR BTL) OPTIME
TOPICAL | Status: DC | PRN
  Administered 2022-04-28: 1000 mL

## 2022-04-28 MED ORDER — OXYCODONE HCL 5 MG PO TABS: 5.0000 mg | ORAL_TABLET | Freq: Once | ORAL | Status: DC | PRN

## 2022-04-28 MED ORDER — SUCCINYLCHOLINE CHLORIDE 200 MG/10ML IV SOSY
PREFILLED_SYRINGE | INTRAVENOUS | Status: DC | PRN
  Administered 2022-04-28: 20 mg via INTRAVENOUS

## 2022-04-28 MED ORDER — LEVOCETIRIZINE DIHYDROCHLORIDE 5 MG PO TABS: 5.0000 mg | ORAL_TABLET | Freq: Every evening | ORAL | Status: DC

## 2022-04-28 MED ORDER — DEXAMETHASONE SODIUM PHOSPHATE 10 MG/ML IJ SOLN
INTRAMUSCULAR | Status: DC | PRN
  Administered 2022-04-28: 10 mg via INTRAVENOUS

## 2022-04-28 MED ORDER — FLUTICASONE FUROATE-VILANTEROL 200-25 MCG/ACT IN AEPB: 1.0000 | INHALATION_SPRAY | Freq: Every day | RESPIRATORY_TRACT | Status: DC | PRN

## 2022-04-28 MED ORDER — CEFAZOLIN SODIUM-DEXTROSE 2-3 GM-%(50ML) IV SOLR
INTRAVENOUS | Status: DC | PRN
  Administered 2022-04-28: 2 g via INTRAVENOUS

## 2022-04-28 SURGICAL SUPPLY — 43 items
BAG COUNTER SPONGE SURGICOUNT (BAG) IMPLANT
BLADE SURG 15 STRL LF DISP TIS (BLADE) ×1 IMPLANT
BLADE SURG 15 STRL SS (BLADE) ×1
BNDG ADH 1X3 SHEER STRL LF (GAUZE/BANDAGES/DRESSINGS) ×2 IMPLANT
CEMENT KYPHON CX01A KIT/MIXER (Cement) ×1 IMPLANT
COVER MAYO STAND STRL (DRAPES) ×1 IMPLANT
COVER SURGICAL LIGHT HANDLE (MISCELLANEOUS) ×1 IMPLANT
CURETTE EXPRESS SZ2 7MM (INSTRUMENTS) IMPLANT
CURETTE WEDGE 8.5MM KYPHX (MISCELLANEOUS) IMPLANT
CURRETTE EXPRESS SZ2 7MM (INSTRUMENTS) ×1
DERMABOND ADVANCED .7 DNX12 (GAUZE/BANDAGES/DRESSINGS) ×1 IMPLANT
DERMABOND ADVANCED .7 DNX6 (GAUZE/BANDAGES/DRESSINGS) IMPLANT
DEVICE BIOPSY BONE KYPHX (INSTRUMENTS) IMPLANT
DRAIN CHANNEL 15F RND FF W/TCR (WOUND CARE) IMPLANT
DRAPE C-ARM 42X72 X-RAY (DRAPES) ×2 IMPLANT
DRAPE INCISE IOBAN 66X45 STRL (DRAPES) ×1 IMPLANT
DRAPE LAPAROTOMY T 102X78X121 (DRAPES) ×1 IMPLANT
DRAPE WARM FLUID 44X44 (DRAPES) ×1 IMPLANT
DURAPREP 26ML APPLICATOR (WOUND CARE) ×1 IMPLANT
GLOVE BIO SURGEON STRL SZ 6.5 (GLOVE) ×1 IMPLANT
GLOVE BIOGEL PI IND STRL 6.5 (GLOVE) ×1 IMPLANT
GLOVE BIOGEL PI IND STRL 8.5 (GLOVE) IMPLANT
GLOVE SS BIOGEL STRL SZ 8.5 (GLOVE) ×1 IMPLANT
GOWN STRL REUS W/ TWL LRG LVL3 (GOWN DISPOSABLE) ×2 IMPLANT
GOWN STRL REUS W/TWL 2XL LVL3 (GOWN DISPOSABLE) ×1 IMPLANT
GOWN STRL REUS W/TWL LRG LVL3 (GOWN DISPOSABLE) ×2
INTRODUCER DEVICE OSTEO LEVEL (INTRODUCER) IMPLANT
KIT BASIN OR (CUSTOM PROCEDURE TRAY) ×1 IMPLANT
KIT TURNOVER KIT B (KITS) ×1 IMPLANT
NDL SPNL 22GX3.5 QUINCKE BK (NEEDLE) ×1 IMPLANT
NEEDLE HYPO 22GX1.5 SAFETY (NEEDLE) ×1 IMPLANT
NEEDLE SPNL 22GX3.5 QUINCKE BK (NEEDLE) ×1 IMPLANT
NS IRRIG 1000ML POUR BTL (IV SOLUTION) ×1 IMPLANT
PACK BASIC III (CUSTOM PROCEDURE TRAY) ×1
PACK SRG BSC III STRL LF ECLPS (CUSTOM PROCEDURE TRAY) ×1 IMPLANT
PAD ARMBOARD 7.5X6 YLW CONV (MISCELLANEOUS) ×2 IMPLANT
SPONGE T-LAP 4X18 ~~LOC~~+RFID (SPONGE) ×1 IMPLANT
SUT MNCRL AB 3-0 PS2 18 (SUTURE) ×1 IMPLANT
SYR CONTROL 10ML LL (SYRINGE) ×1 IMPLANT
TOWEL GREEN STERILE (TOWEL DISPOSABLE) ×1 IMPLANT
TRAY KYPHOPAK 15/3 ONESTEP 1ST (MISCELLANEOUS) IMPLANT
TRAY KYPHOPAK 20/3 ONESTEP 1ST (MISCELLANEOUS) IMPLANT
WATER STERILE IRR 1000ML POUR (IV SOLUTION) ×1 IMPLANT

## 2022-04-28 NOTE — Brief Op Note (Signed)
04/28/2022  7:54 PM  PATIENT:  Linda Gutierrez  46 y.o. female  PRE-OPERATIVE DIAGNOSIS:  L4 pathologic compression fracture  POST-OPERATIVE DIAGNOSIS:  L4 pathologic compression fracture  PROCEDURE:  Procedure(s) with comments: L4 BIOPSY AND KYPHOPLASTY (N/A) - 90 mins  SURGEON:  Surgeon(s) and Role:    Melina Schools, MD - Primary  PHYSICIAN ASSISTANT:   ASSISTANTS: none   ANESTHESIA:   general  EBL:  minimal   BLOOD ADMINISTERED:none  DRAINS: none   LOCAL MEDICATIONS USED:  MARCAINE     SPECIMEN:  Source of Specimen:  L4 vertebral body  DISPOSITION OF SPECIMEN:  PATHOLOGY  COUNTS:  YES  TOURNIQUET:  * No tourniquets in log *  DICTATION: .Dragon Dictation  PLAN OF CARE: Admit for overnight observation  PATIENT DISPOSITION:  PACU - hemodynamically stable.

## 2022-04-28 NOTE — Progress Notes (Signed)
Patient wanted to go home, this Probation officer spoke to Dr. Rolena Infante and MD stated that the reason why the patient was admitted for midnight observation is because she vomited at the end of the case and there's a risk of aspiration. This writer went to the patient and explained to her the reason why they want to keep her but she is still adamant with her decision to go home stating " I can't get comfortable in that bed". NT in the room as well. This Probation officer called back MD and stated that the patient will need to sign AMA. AMA form was signed by patient, spouse and mother at bedside. Discharged instructions was discussed. VS stable at the time of discharge.

## 2022-04-28 NOTE — Op Note (Signed)
OPERATIVE REPORT fractional  DATE OF SURGERY: 04/28/2022  PATIENT NAME:  Linda Gutierrez MRN: [id number] DOB: 1976-03-24  PCP: Lafonda Mosses, MD  PRE-OPERATIVE DIAGNOSIS: Pathological L4 compression fracture  POST-OPERATIVE DIAGNOSIS: Same  PROCEDURE:   Biopsy of L4 lesion. L4 kyphoplasty  SURGEON:  Melina Schools, MD  PHYSICIAN ASSISTANT: None  ANESTHESIA:   General  EBL: Minimal ml   Complications:   Attempted procedure under IV sedation by patient being agitated and so had to convert to general anesthesia. 2.  Cement leak was noted on the right side through the superior endplate of L4 into the disc space.  BRIEF HISTORY: Linda Gutierrez is a 46 y.o. female who presented my office with complaints of lower lumbar spine pain.  Patient has a history of breast cancer and imaging confirmed a pathological lesion at L4.  After discussion we elected to move forward with a biopsy and kyphoplasty to help reduce the pain of the lesion.  All appropriate risks, benefits, and alternatives to surgery were discussed with the patient and consent was obtained.  PROCEDURE DETAILS: Patient was brought into the operating room and was properly positioned on the operating room table.  Once the patient was comfortable IV sedation was provided.  The back was then prepped and draped in a standard fashion and a timeout was taken confirming patient procedure and all other important data.  Using fluoroscopy I identified the L4 vertebral body and marked out the lateral aspect of the pedicles on the skin surface.  I then anesthetized the skin with quarter percent Marcaine with epinephrine which caused the patient significant distress.  She became significantly agitated and was moving.  We were able to increase her sedation and quiet her down and reassure her.  I then attempted to anesthetize the right area but again this caused significant agitation.  Once we did anesthetize the skin the patient was educated  on the need to remain still and she indicated she would like to proceed.  Small stab incision was made and the Jamshidi needle was advanced percutaneously to the lateral aspect of the pedicle.  This will additionally cause significant agitation and the patient began coughing quite significantly.  At this point I elected to cancel the attempted IV sedation procedure.  The Jamshidi needle was removed the skin surface is clean and23 0 Monocryl stitch was placed in the skin and a Band-Aid was applied.  The patient was then turned supine onto the stretcher.  At this point time I spoke with the husband and he elected to move forward with proceeding with general anesthesia.  The patient was also counseled by anesthesia prior to surgery that we may have to abandon the IV sedation and move forward with general anesthesia.  After induction with general anesthesia the patient was endotracheally intubated.  A timeout was taken to confirm all important data: including patient, procedure, and the level. Teds, SCD's were applied.   With the back reprepped and draped I then identified the lateral borders of the pedicle.  Small stab incisions were made the Jamshidi needle was advanced percutaneously down to the lateral aspect of the pedicle.  On the right side I advanced into the pedicle and into the vertebral body.  I then placed a biopsy needle and obtained 2 core specimens.  I sent 1 to pathology for initial evaluation.  Dr. Glennon Mac the pathologist called me back and said it was a good specimen of bone.  Since we had a decent specimen I did send  the second 1 this time in formalin.  I then cannulated the left pedicle in the same fashion from a percutaneous stab incision.  Care was taken while advancing using AP and lateral fluoroscopy to guide my trajectory.  With both pedicles cannulated I then inserted the drill and then the curette in order to improve the cement flow.  I then placed the left balloon and inflated and removed  the guidepin.  I then placed a right 1 and slowly began inflating it.  I was using fluoroscopy monitoring the insufflation.  At a very low pressure the balloon did violate the superior cortex through the area of metastatic disease.  At this point I tried to redirect the cannula and advanced so that the balloon would not cause further damage to the superior endplate.  On the left side I removed inflatable tamp and inserted the cement.  I had excellent fill on this left side but unfortunately it would not cross the midline.  On the right side I remove the inflatable bone tamp and then inserted approximately 1 cc of cement.  I then reinserted the balloon and reinflated to create a cavity to seal off the breach in the superior endplate.  I then removed the balloon and inserted some more cement but unfortunately it did migrate out into the L3-4 disc space.  The cement was allowed to harden.  At this point there was no evidence of leak posteriorly or laterally or anteriorly.  3-0 Monocryl sutures were used to close the skin surfaces and then I cleaned the skin.  Dermabond and dry dressing were then applied and the patient was extubated transfer the PACU without incident.  The end of the case all needle sponge counts were correct.  There were no adverse intraoperative events.  Melina Schools, MD 04/28/2022 7:44 PM

## 2022-04-28 NOTE — Anesthesia Postprocedure Evaluation (Signed)
Anesthesia Post Note  Patient: Linda Gutierrez  Procedure(s) Performed: L4 BIOPSY AND KYPHOPLASTY     Patient location during evaluation: PACU Anesthesia Type: General Level of consciousness: sedated Pain management: pain level controlled Vital Signs Assessment: post-procedure vital signs reviewed and stable Respiratory status: spontaneous breathing and respiratory function stable Cardiovascular status: stable Postop Assessment: no apparent nausea or vomiting Anesthetic complications: yes   Encounter Notable Events  Notable Event Outcome Phase Comment  Difficult to intubate - unexpected  Intraprocedure Filed from anesthesia note documentation.    Last Vitals:  Vitals:   04/28/22 2025 04/28/22 2040  BP: 123/76 125/78  Pulse: 78 70  Resp: 14 18  Temp:    SpO2: 97% 98%    Last Pain:  Vitals:   04/28/22 2040  TempSrc:   PainSc: 0-No pain                 Yeilin Zweber DANIEL

## 2022-04-28 NOTE — Anesthesia Procedure Notes (Signed)
Procedure Name: Intubation Date/Time: 04/28/2022 6:23 PM  Performed by: Rande Brunt, CRNAPre-anesthesia Checklist: Patient identified, Emergency Drugs available, Suction available and Patient being monitored Patient Re-evaluated:Patient Re-evaluated prior to induction Oxygen Delivery Method: Circle System Utilized Preoxygenation: Pre-oxygenation with 100% oxygen Induction Type: IV induction Ventilation: Mask ventilation without difficulty Laryngoscope Size: Glidescope and 4 Grade View: Grade I Tube type: Oral Tube size: 7.0 mm Number of attempts: 1 Airway Equipment and Method: Stylet and Oral airway Placement Confirmation: ETT inserted through vocal cords under direct vision, positive ETCO2 and breath sounds checked- equal and bilateral Secured at: 21 cm Tube secured with: Tape Dental Injury: Teeth and Oropharynx as per pre-operative assessment  Difficulty Due To: Difficulty was unanticipated and Difficult Airway-  due to edematous airway Comments: Attempted intubation with Sabra Heck 2 by CRNA Jaquelyn Bitter with esophageal intubation, Dr. Smith Robert attempted with MAC 4 with esophageal intubation. OG placed and then grade 1 view with MAC 4 with glidescope.

## 2022-04-28 NOTE — Transfer of Care (Signed)
Immediate Anesthesia Transfer of Care Note  Patient: Linda Gutierrez  Procedure(s) Performed: L4 BIOPSY AND KYPHOPLASTY  Patient Location: PACU  Anesthesia Type:General  Level of Consciousness: awake, drowsy, and patient cooperative  Airway & Oxygen Therapy: Patient Spontanous Breathing and Patient connected to face mask oxygen  Post-op Assessment: Report given to RN, Post -op Vital signs reviewed and stable, and Patient moving all extremities X 4  Post vital signs: Reviewed and stable  Last Vitals:  Vitals Value Taken Time  BP 144/84 04/28/22 2007  Temp    Pulse 86 04/28/22 2009  Resp 29 04/28/22 2009  SpO2 98 % 04/28/22 2009  Vitals shown include unvalidated device data.  Last Pain:  Vitals:   04/28/22 1333  TempSrc:   PainSc: 0-No pain         Complications:  Encounter Notable Events  Notable Event Outcome Phase Comment  Difficult to intubate - unexpected  Intraprocedure Filed from anesthesia note documentation.

## 2022-04-28 NOTE — Progress Notes (Addendum)
Pt states she had half a bagel at 8:00AM this morning. Dr Smith Robert notified. Pt to be scheduled for later today. Pt states that she wants to leave the hospital, then come back. Pt to come back to hospital at 1:30PM. Pt instructed not to eat or drink anything.

## 2022-04-28 NOTE — H&P (Signed)
History: Linda Gutierrez is a very pleasant 46 year old with a pathological fracture who presents with ongoing severe pain.  At this point time because of the abnormality and pain we have elected to move forward with a biopsy and kyphoplasty of L4 to confirm the diagnosis as well as to treat the pain.  All appropriate risks, benefits, and alternatives to surgery were discussed and the patient consented to surgery. Past Medical History:  Diagnosis Date   Abnormal Pap smear of cervix 2016   HPV   Anxiety    Arthritis    Asthma    Breast cancer ([place]) 2015   rt side   Cancer ([place])    metestatic- on liver   Depression    GERD (gastroesophageal reflux disease)    History of hiatal hernia    Pneumonia    STD (sexually transmitted disease)    HPV   Urinary incontinence     No Known Allergies  No current facility-administered medications on file prior to encounter.   Current Outpatient Medications on File Prior to Encounter  Medication Sig Dispense Refill   BREO ELLIPTA 200-25 MCG/ACT AEPB Inhale 1 puff into the lungs daily as needed (shortness of breath).     busPIRone (BUSPAR) 15 MG tablet Take 15 mg by mouth 2 (two) times daily.     calcium carbonate (OS-CAL - DOSED IN MG OF ELEMENTAL CALCIUM) 1250 (500 Ca) MG tablet Take 1 tablet by mouth 3 (three) times daily with meals.     cholecalciferol (VITAMIN D3) 25 MCG (1000 UNIT) tablet Take 5,000 Units by mouth daily.     CLINPRO 5000 1.1 % PSTE Place 1 application  onto teeth.     letrozole (FEMARA) 2.5 MG tablet Take 1 tablet (2.5 mg total) by mouth daily. 90 tablet 3   levocetirizine (XYZAL) 5 MG tablet Take 5 mg by mouth every evening.     montelukast (SINGULAIR) 10 MG tablet Take 10 mg by mouth at bedtime.     sertraline (ZOLOFT) 100 MG tablet Take 200 mg by mouth daily.      Physical Exam: Vitals:   04/28/22 0940  BP: (!) 149/101  Pulse: 86  Resp: 18  Temp: 98.4 F (36.9 C)  SpO2: 97%   Body mass index is 36.77  kg/m. Clinical exam: Marqueta is a pleasant individual, who appears younger than their stated age.  She is alert and orientated 3.  No shortness of breath, chest pain.  Abdomen is soft and non-tender, negative loss of bowel and bladder control, no rebound tenderness.  Negative: skin lesions abrasions contusions  Peripheral pulses: 2+ dorsalis pedis/posterior tibialis pulses bilaterally. LE compartments are: Soft and nontender.  Gait pattern: Normal  Assistive devices: None  Neuro: 5/5 motor strength in the lower extremity bilaterally. Negative Babinski test, no clonus, negative straight leg raise test. 1+ deep tendon reflexes at the knee and Achilles.  Musculoskeletal: Significant lower thoracic and lumbar pain with palpation and an range of motion. Pain is greatest in the lower lumbar region. Pain radiates into the paraspinal region. Patient has also had pain in the anterior pelvis/groin region.  Thoracic and lumbar MRI's: Multiple T2 hyperintense lesions in the liver measuring up to 2.8 cm in size. Pathological metastatic lesion in the right T11 pars and transverse process but no intraspinal extension. No fracture is noted.  The lumbar MRI demonstrates large pathological lesion at L4 with a compression fracture. The lesion involves the right lateral aspect of the vertebral  body and extends into the right L4 pedicle. There is additional metastatic lesions within the left anterior superior L5 vertebral body but no fracture. No intraspinal lesions noted.   A/P: Summary: Linda Gutierrez is a very pleasant 46 year old woman with a history of breast cancer who presents to me with low back pain. Fortunately she has no signs or symptoms of radiculopathy or myelopathy. She had a CT scan which demonstrated metastatic lesions and so an MRI of her thoracic and lumbar spine was requested. Patient has metastatic lesions at T11 and L4 but the principal concern is the L4 level. She has a pathological fracture  which is most likely the source of her low back pain.   At this point time I think the best course of action is a L4 kyphoplasty. This will allow for biopsy, as well as stabilization of the fracture and decreasing the tumor burden at this level. I do not think the T11 lesion requires surgical intervention at this time. There is no pathological fracture and while it does involve the pedicle she shows no signs of instability or the potential for significant instability.  Risks of kyphoplasty include: Infection, bleeding, nerve damage, death, stroke, paralysis. Leak of cement ongoing or worse pain, need for additional surgery including other fracture levels.

## 2022-04-29 ENCOUNTER — Encounter (HOSPITAL_COMMUNITY): Payer: Self-pay | Admitting: Orthopedic Surgery

## 2022-05-05 NOTE — Discharge Summary (Signed)
Patient ID: Linda Gutierrez MRN: [id number] DOB/AGE: Sep 22, 1975 46 y.o.  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 female who was admitted 04/28/2022 for operative treatment of Lumbar compression fracture ([place]). Patient failed conservative treatments (please see the history and physical for the specifics) and had severe unremitting pain that affects sleep, daily activities and work/hobbies. After pre-op clearance, the patient was taken to the operating room on 04/28/2022 and underwent  Procedure(s): L4 BIOPSY AND KYPHOPLASTY.    Patient was given perioperative antibiotics:  Anti-infectives (From admission, onward)    Start     Dose/Rate Route Frequency Ordered Stop   04/29/22 0000  ceFAZolin (ANCEF) IVPB 1 g/50 mL premix  Status:  Discontinued        1 g 100 mL/hr over 30 Minutes Intravenous Every 8 hours 04/28/22 2056 04/29/22 0416   04/28/22 0925  ceFAZolin (ANCEF) 2-4 GM/100ML-% IVPB       Note to Pharmacy: [place]: cabinet override      04/28/22 0925 04/28/22 1754   04/28/22 0923  ceFAZolin (ANCEF) IVPB 2g/100 mL premix        2 g 200 mL/hr over 30 Minutes Intravenous 30 min pre-op 04/28/22 0923 04/28/22 1739          Recent vital signs: No data found.   Recent laboratory studies: No results for input(s): "WBC", "HGB", "HCT", "PLT", "NA", "K", "CL", "CO2", "BUN", "CREATININE", "GLUCOSE", "INR", "CALCIUM" in the last 72 hours.  Invalid input(s): "PT", "2"   Discharge Medications:   Allergies as of 04/28/2022   No Known Allergies      Medication List     TAKE these medications    abemaciclib 100 MG tablet Commonly known as: VERZENIO Take 1 tablet (100 mg total) by mouth 2 (two) times daily. Swallow tablets whole. Do not chew, crush, or split tablets before swallowing.   Breo Ellipta 200-25 MCG/ACT Aepb Generic drug: fluticasone furoate-vilanterol Inhale 1 puff into the lungs daily as needed (shortness of breath).   busPIRone 15 MG tablet Commonly known as: BUSPAR Take 15 mg by mouth 2 (two) times daily.   calcium carbonate 1250 (500 Ca) MG tablet Commonly known as: OS-CAL - dosed in mg of elemental calcium Take 1 tablet by mouth 3 (three) times daily with meals.   cholecalciferol 25 MCG (1000 UNIT) tablet Commonly known as: VITAMIN D3 Take 5,000 Units by mouth daily.   Clinpro 5000 1.1 % Pste Generic drug: Sodium Fluoride Place 1 application  onto teeth.   letrozole 2.5 MG tablet Commonly known as: FEMARA Take 1 tablet (2.5 mg total) by mouth daily.   levocetirizine 5 MG tablet  Commonly known as: XYZAL Take 5 mg by mouth every evening.   montelukast 10 MG tablet Commonly known as: SINGULAIR Take 10 mg by mouth at bedtime.   ondansetron 4 MG tablet Commonly known as: Zofran Take 1 tablet (4 mg total) by mouth every 8 (eight) hours as needed for nausea or vomiting.   sertraline 100 MG tablet Commonly known as: ZOLOFT Take 200 mg by mouth daily.       ASK your doctor about these medications    methocarbamol 500 MG tablet Commonly known as: ROBAXIN Take 1 tablet (500 mg total) by mouth every 8 (eight) hours as needed for up to 5 days for muscle spasms. Ask  about: Should I take this medication?   oxyCODONE-acetaminophen 10-325 MG tablet Commonly known as: Percocet Take 1 tablet by mouth every 6 (six) hours as needed for up to 5 days for pain. Ask about: Should I take this medication?        Diagnostic Studies: DG Lumbar Spine 2-3 Views  Result Date: 04/28/2022 CLINICAL DATA:  Lumbar surgery. EXAM: LUMBAR SPINE - 2-3 VIEW COMPARISON:  None Available. FINDINGS: Two intraoperative fluoroscopic spot images provided. The total fluoroscopic time is 5 minutes and 1 second. The total air kerma is 325.49 mGy. IMPRESSION: Intraoperative fluoroscopic spot images as above. Electronically Signed   By: Anner Crete M.D.   On: 04/28/2022 20:31   DG C-Arm 1-60 Min-No Report  Result Date: 04/28/2022 Fluoroscopy was utilized by the requesting physician.  No radiographic interpretation.   DG C-Arm 1-60 Min-No Report  Result Date: 04/28/2022 Fluoroscopy was utilized by the requesting physician.  No radiographic interpretation.    Discharge Instructions     Incentive spirometry RT   Complete by: As directed         Follow-up Information     Melina Schools, MD Follow up in 2 week(s).   Specialty: Orthopedic Surgery Why: If symptoms worsen, For suture removal, For wound re-check Contact information: [address] [place] [address] [phone]                 Discharge Plan: Patient left AMA.  It was recommended by anesthesia as she did have emesis at stimulation and she will be observed overnight in the hospital.  This was explained to the patient by the nurse and despite this she elected to leave.   Signed: Dahlia Bailiff for Dr. Melina Schools [facility] [phone] 05/05/2022, 7:39 AM

## 2022-05-06 ENCOUNTER — Encounter: Payer: Self-pay | Admitting: Hematology and Oncology

## 2022-05-06 LAB — SURGICAL PATHOLOGY

## 2022-05-08 ENCOUNTER — Inpatient Hospital Stay: Payer: 59 | Attending: Adult Health | Admitting: Hematology and Oncology

## 2022-05-08 DIAGNOSIS — C7951 Secondary malignant neoplasm of bone: Secondary | ICD-10-CM

## 2022-05-08 NOTE — Progress Notes (Signed)
HEMATOLOGY-ONCOLOGY TELEPHONE VISIT PROGRESS NOTE  I connected with our patient on 05/08/22 at  9:15 AM EST by telephone and verified that I am speaking with the correct person using two identifiers.  I discussed the limitations, risks, security and privacy concerns of performing an evaluation and management service by telephone and the availability of in person appointments.  I also discussed with the patient that there may be a patient responsible charge related to this service. The patient expressed understanding and agreed to proceed.   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 MRI Breast (11/21/13): large geographic areas of the right breast malignancy within the 6 to 8 o'clock position right breast lower outer quadrant extending from the chest wall to the retroareolar area of the right breast. Multiple suspicious nodules within the right axilla. 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    04/18/2014 Surgery   Bilateral mastectomies  Right: DCIS but no residual invasive carcinoma. Margins were uninvolved. Sentinel lymph node had a focus of metastatic carcinoma measuring 0.4 cm surgical stage was Tis N1A. Left: benign     Chemotherapy   Received neoadjuvant chemotherapy with Adriamycin and Cytoxan x4.  She then had surgery and then received adjuvant therapy with Taxol weekly x12        - 11/2018 Anti-estrogen oral therapy   Adjuvant antiestrogen therapy with tamoxifen completed June 2020   12/18/2021 Relapse/Recurrence   Palpable lesion in the right breast.  Mammogram and ultrasound at [facility] revealed recurrent nodules in the reconstructed  right breast along with enlarged lymph nodes.  Biopsy of the breast and the lymph nodes was performed that revealed breast cancer that was ER/PR 100% positive HER2 negative.  Breast MRI revealed 3 masses in the right breast measuring 2.4, 2.2, 2.5 cm multiple enlarged axillary lymph nodes measuring up to 2.4 cm subpectoral lymph node 1.3 cm, liver 2.8 cm, 2.5 cm and 2.8 cm masses     REVIEW OF SYSTEMS:   Constitutional: Denies fevers, chills or abnormal weight loss All other systems were reviewed with the patient and are negative. Observations/Objective:     Assessment Plan:  Cancer, metastatic to bone [facility]) May 2015: Right breast lump, mammogram ultrasound and MRI showed multiple suspicious nodules within the right axilla.  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, lymph node biopsy: Positive, ER 100%, PR 100%, Ki-67 15%, HER2 equivocal 2+, FISH: 12/26/2021: PET/CT: Small hypermetabolic right chest wall nodules and right axillary lymphadenopathy consistent with recurrent breast cancer, numerous large hepatic metastatic lesions, small scattered pulmonary nodules, diffuse lytic/destructive osseous metastatic lesions especially at L4   Current treatment: letrozole and Verzinio started 01/03/2022 (Zoladex discontinued since she had BSO on 02/11/2022)  Oophorectomy Right hip replacement surgery L4 kyphoplasty: Does not show any evidence of breast cancer on the biopsies  We will await the results of the CT scans to see if the liver is responding as well.   I discussed the assessment and treatment  plan with the patient.  The patient was provided an opportunity to ask questions and all were answered. The patient agreed with the plan and demonstrated an understanding of the instructions. The patient was advised to call back or seek an in-person evaluation if the symptoms worsen or if the condition fails to improve as anticipated.   I provided 12 minutes of non-face-to-face time during this encounter.  This includes time for charting and coordination of care   Harriette Ohara, MD

## 2022-05-08 NOTE — Assessment & Plan Note (Signed)
May 2015: Right breast lump, mammogram ultrasound and MRI showed multiple suspicious nodules within the right axilla.  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, lymph node biopsy: Positive, ER 100%, PR 100%, Ki-67 15%, HER2 equivocal 2+, FISH: 12/26/2021: PET/CT: Small hypermetabolic right chest wall nodules and right axillary lymphadenopathy consistent with recurrent breast cancer, numerous large hepatic metastatic lesions, small scattered pulmonary nodules, diffuse lytic/destructive osseous metastatic lesions especially at L4   Current treatment: letrozole and Verzinio started 01/03/2022 (Zoladex discontinued since she had BSO on 02/11/2022)  Oophorectomy Right hip replacement surgery L4 kyphoplasty: Does not show any evidence of breast cancer on the biopsies  We will await the results of the CT scans to see if the liver is responding as well.

## 2022-05-14 ENCOUNTER — Other Ambulatory Visit: Payer: 59

## 2022-05-22 ENCOUNTER — Ambulatory Visit
Admission: RE | Admit: 2022-05-22 | Discharge: 2022-05-22 | Disposition: A | Discharge status: Home / Self Care | Payer: 59 | Source: Ambulatory Visit | Attending: General Surgery | Admitting: General Surgery

## 2022-05-22 DIAGNOSIS — R160 Hepatomegaly, not elsewhere classified: Secondary | ICD-10-CM

## 2022-05-22 DIAGNOSIS — C50411 Malignant neoplasm of upper-outer quadrant of right female breast: Secondary | ICD-10-CM

## 2022-05-22 DIAGNOSIS — C50911 Malignant neoplasm of unspecified site of right female breast: Secondary | ICD-10-CM

## 2022-05-22 MED ORDER — IOPAMIDOL (ISOVUE-300) INJECTION 61%
100.0000 mL | Freq: Once | INTRAVENOUS | Status: AC | PRN
  Administered 2022-05-22: 100 mL via INTRAVENOUS

## 2022-05-27 NOTE — Progress Notes (Signed)
Patient Care Team: Lafonda Mosses, MD as PCP - General (Gynecologic Oncology) Nicholas Lose, MD as Consulting Physician (Hematology and Oncology) Stark Klein, MD as Consulting Physician (General Surgery)  DIAGNOSIS: No diagnosis found.  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 MRI Breast (11/21/13): large geographic areas of the right breast malignancy within the 6 to 8 o'clock position right breast lower outer quadrant extending from the chest wall to the retroareolar area of the right breast. Multiple suspicious nodules within the right axilla. 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    04/18/2014 Surgery   Bilateral mastectomies  Right: DCIS but no residual invasive carcinoma. Margins were uninvolved. Sentinel lymph node had a focus of metastatic carcinoma measuring 0.4 cm surgical stage was Tis N1A. Left: benign     Chemotherapy   Received neoadjuvant chemotherapy with Adriamycin and Cytoxan x4.  She then had surgery and then received adjuvant therapy with Taxol weekly x12        - 11/2018 Anti-estrogen oral therapy   Adjuvant antiestrogen therapy with tamoxifen completed June 2020   12/18/2021 Relapse/Recurrence   Palpable lesion in the right breast.  Mammogram and ultrasound at [facility] revealed recurrent nodules in the reconstructed right breast along with enlarged lymph nodes.  Biopsy of the breast and the lymph nodes was performed that revealed breast cancer that was ER/PR 100% positive HER2 negative.  Breast MRI revealed 3 masses in the right breast measuring 2.4, 2.2, 2.5 cm multiple enlarged axillary lymph nodes measuring up to 2.4 cm subpectoral lymph node 1.3 cm, liver 2.8 cm, 2.5 cm and 2.8 cm  masses     CHIEF COMPLIANT:  Follow-up metastatic breast cancer on Letrozole Verzinio discuss dental clearance  INTERVAL HISTORY: Linda Gutierrez is a 46 y.o. female is here because of a history of CHEK2-related breast cancer. She presents to the clinic today for labs and follow-up.     ALLERGIES:  has No Known Allergies.  MEDICATIONS:  Current Outpatient Medications  Medication Sig Dispense Refill   abemaciclib (VERZENIO) 100 MG tablet Take 1 tablet (100 mg total) by mouth 2 (two) times daily. Swallow tablets whole. Do not chew, crush, or split tablets before swallowing. 56 tablet 3   BREO ELLIPTA 200-25 MCG/ACT AEPB Inhale 1 puff into the lungs daily as needed (shortness of breath).     busPIRone (BUSPAR) 15 MG tablet Take 15 mg by mouth 2 (two) times daily.     calcium carbonate (OS-CAL - DOSED IN MG OF ELEMENTAL CALCIUM) 1250 (500 Ca) MG tablet Take 1 tablet by mouth 3 (three) times daily with meals.     cholecalciferol (VITAMIN D3) 25 MCG (1000 UNIT) tablet Take 5,000 Units by mouth daily.     CLINPRO 5000 1.1 % PSTE Place 1 application  onto teeth.     letrozole (FEMARA) 2.5 MG tablet Take 1 tablet (2.5 mg total) by mouth daily. 90 tablet 3   levocetirizine (XYZAL) 5 MG tablet Take 5 mg by mouth every evening.     montelukast (SINGULAIR) 10 MG tablet Take 10 mg by mouth at bedtime.     ondansetron (ZOFRAN) 4 MG tablet Take 1 tablet (4 mg total) by mouth every 8 (eight) hours as needed for nausea or vomiting. 20 tablet 0  sertraline (ZOLOFT) 100 MG tablet Take 200 mg by mouth daily.     No current facility-administered medications for this visit.    PHYSICAL EXAMINATION: ECOG PERFORMANCE STATUS: {CHL ONC ECOG [phone]}  There were no vitals filed for this visit. There were no vitals filed for this visit.  BREAST:*** No palpable masses or nodules in either right or left breasts. No palpable axillary supraclavicular or infraclavicular adenopathy no breast tenderness or  nipple discharge. (exam performed in the presence of a chaperone)  LABORATORY DATA:  I have reviewed the data as listed    Latest Ref Rng & Units 04/03/2022   12:23 PM 03/05/2022    2:34 PM 02/18/2022    2:50 PM  CMP  Glucose 70 - 99 mg/dL 96  92  133   BUN 6 - 20 mg/dL _0 Creatinine 0.44 - 1.00 mg/dL 0.93  1.04  0.75   Sodium 135 - 145 mmol/L 141  141  139   Potassium 3.5 - 5.1 mmol/L 4.5  4.0  3.7   Chloride 98 - 111 mmol/L 107  108  105   CO2 22 - 32 mmol/L _1 Calcium 8.9 - 10.3 mg/dL 9.2  9.0  8.8   Total Protein 6.5 - 8.1 g/dL 7.7  7.4  7.1   Total Bilirubin 0.3 - 1.2 mg/dL 0.8  0.6  0.8   Alkaline Phos 38 - 126 U/L 56  56  67   AST 15 - 41 U/L _2 ALT 0 - 44 U/L _3 Lab Results  Component Value Date   WBC 5.1 04/03/2022   HGB 13.4 04/03/2022   HCT 36.8 04/03/2022   MCV 98.4 04/03/2022   PLT 239 04/03/2022   NEUTROABS 3.6 04/03/2022    ASSESSMENT & PLAN:  No problem-specific Assessment & Plan notes found for this encounter.    No orders of the defined types were placed in this encounter.  The patient has a good understanding of the overall plan. she agrees with it. she will call with any problems that may develop before the next visit here. Total time spent: 30 mins including face to face time and time spent for planning, charting and co-ordination of care   Suzzette Righter, [place] 05/27/22    I Gardiner Coins am acting as a Education administrator for [employer]  ***

## 2022-05-28 ENCOUNTER — Inpatient Hospital Stay: Payer: 59

## 2022-05-28 ENCOUNTER — Other Ambulatory Visit: Payer: Self-pay

## 2022-05-28 ENCOUNTER — Inpatient Hospital Stay: Payer: 59 | Attending: Adult Health | Admitting: Hematology and Oncology

## 2022-05-28 VITALS — BP 152/89 | HR 82 | Temp 97.5°F | Resp 17 | Wt 244.8 lb

## 2022-05-28 DIAGNOSIS — Z1509 Genetic susceptibility to other malignant neoplasm: Secondary | ICD-10-CM

## 2022-05-28 DIAGNOSIS — Z1502 Genetic susceptibility to malignant neoplasm of ovary: Secondary | ICD-10-CM

## 2022-05-28 DIAGNOSIS — Z79811 Long term (current) use of aromatase inhibitors: Secondary | ICD-10-CM | POA: Insufficient documentation

## 2022-05-28 DIAGNOSIS — Z1589 Genetic susceptibility to other disease: Secondary | ICD-10-CM | POA: Diagnosis not present

## 2022-05-28 DIAGNOSIS — Z170 Estrogen receptor positive status [ER+]: Secondary | ICD-10-CM | POA: Diagnosis not present

## 2022-05-28 DIAGNOSIS — C50811 Malignant neoplasm of overlapping sites of right female breast: Secondary | ICD-10-CM | POA: Insufficient documentation

## 2022-05-28 DIAGNOSIS — C50919 Malignant neoplasm of unspecified site of unspecified female breast: Secondary | ICD-10-CM

## 2022-05-28 DIAGNOSIS — C50911 Malignant neoplasm of unspecified site of right female breast: Secondary | ICD-10-CM

## 2022-05-28 DIAGNOSIS — C7951 Secondary malignant neoplasm of bone: Secondary | ICD-10-CM | POA: Diagnosis not present

## 2022-05-28 LAB — CBC WITH DIFFERENTIAL (CANCER CENTER ONLY)
Abs Immature Granulocytes: 0.02 10*3/uL (ref 0.00–0.07)
Basophils Absolute: 0.1 10*3/uL (ref 0.0–0.1)
Basophils Relative: 2 %
Eosinophils Absolute: 0.2 10*3/uL (ref 0.0–0.5)
Eosinophils Relative: 4 %
HCT: 37.3 % (ref 36.0–46.0)
Hemoglobin: 13.5 g/dL (ref 12.0–15.0)
Immature Granulocytes: 0 %
Lymphocytes Relative: 23 %
Lymphs Abs: 1.3 10*3/uL (ref 0.7–4.0)
MCH: 35.8 pg — ABNORMAL HIGH (ref 26.0–34.0)
MCHC: 36.2 g/dL — ABNORMAL HIGH (ref 30.0–36.0)
MCV: 98.9 fL (ref 80.0–100.0)
Monocytes Absolute: 0.5 10*3/uL (ref 0.1–1.0)
Monocytes Relative: 10 %
Neutro Abs: 3.4 10*3/uL (ref 1.7–7.7)
Neutrophils Relative %: 61 %
Platelet Count: 228 10*3/uL (ref 150–400)
RBC: 3.77 MIL/uL — ABNORMAL LOW (ref 3.87–5.11)
RDW: 12 % (ref 11.5–15.5)
WBC Count: 5.5 10*3/uL (ref 4.0–10.5)
nRBC: 0 % (ref 0.0–0.2)

## 2022-05-28 LAB — CMP (CANCER CENTER ONLY)
ALT: 40 U/L (ref 0–44)
AST: 33 U/L (ref 15–41)
Albumin: 4 g/dL (ref 3.5–5.0)
Alkaline Phosphatase: 56 U/L (ref 38–126)
Anion gap: 3 — ABNORMAL LOW (ref 5–15)
BUN: 15 mg/dL (ref 6–20)
CO2: 30 mmol/L (ref 22–32)
Calcium: 9.4 mg/dL (ref 8.9–10.3)
Chloride: 105 mmol/L (ref 98–111)
Creatinine: 0.97 mg/dL (ref 0.44–1.00)
GFR, Estimated: >60 mL/min (ref >60)
Glucose, Bld: 101 mg/dL — ABNORMAL HIGH (ref 70–99)
Potassium: 4.1 mmol/L (ref 3.5–5.1)
Sodium: 138 mmol/L (ref 135–145)
Total Bilirubin: 0.5 mg/dL (ref 0.3–1.2)
Total Protein: 7.5 g/dL (ref 6.5–8.1)

## 2022-05-28 MED ORDER — ZOLEDRONIC ACID 4 MG/100ML IV SOLN
4.0000 mg | Freq: Once | INTRAVENOUS | Status: AC
  Administered 2022-05-28: 4 mg via INTRAVENOUS
  Filled 2022-05-28: qty 100

## 2022-05-28 MED ORDER — SODIUM CHLORIDE 0.9 % IV SOLN: Freq: Once | INTRAVENOUS | Status: AC

## 2022-05-28 NOTE — Progress Notes (Signed)
Per Dr Lindi Adie, proceed with Zometa infusion today.

## 2022-05-28 NOTE — Assessment & Plan Note (Signed)
May 2015: Right breast lump, mammogram ultrasound and MRI showed multiple suspicious nodules within the right axilla.  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, lymph node biopsy: Positive, ER 100%, PR 100%, Ki-67 15%, HER2 equivocal 2+, FISH: 12/26/2021: PET/CT: Small hypermetabolic right chest wall nodules and right axillary lymphadenopathy consistent with recurrent breast cancer, numerous large hepatic metastatic lesions, small scattered pulmonary nodules, diffuse lytic/destructive osseous metastatic lesions especially at L4   Current treatment: letrozole and Verzinio started 01/03/2022 (Zoladex discontinued since she had BSO on 02/11/2022)   Oophorectomy Right hip replacement surgery L4 kyphoplasty: Does not show any evidence of breast cancer on the biopsies  05/24/2022: CT CAP: Significant partial response to treatment with decrease in size of the hepatic lesions from 5.7 cm to 3.5 cm, sclerotic changes in the previous bone lesions  Continue with the current treatment plan because she is responding to treatment.

## 2022-05-28 NOTE — Patient Instructions (Signed)

## 2022-06-12 ENCOUNTER — Telehealth: Payer: Self-pay | Admitting: Hematology and Oncology

## 2022-06-12 NOTE — Telephone Encounter (Signed)
Scheduled appointment per 12/20 los. Patient is aware. 

## 2022-07-04 ENCOUNTER — Encounter: Payer: Self-pay | Admitting: Hematology and Oncology

## 2022-07-14 ENCOUNTER — Other Ambulatory Visit: Payer: Self-pay | Admitting: Hematology and Oncology

## 2022-07-30 ENCOUNTER — Encounter: Payer: Self-pay | Admitting: Hematology and Oncology

## 2022-07-31 ENCOUNTER — Other Ambulatory Visit: Payer: Self-pay | Admitting: *Deleted

## 2022-07-31 DIAGNOSIS — C50911 Malignant neoplasm of unspecified site of right female breast: Secondary | ICD-10-CM

## 2022-08-01 ENCOUNTER — Telehealth: Payer: Self-pay | Admitting: Hematology and Oncology

## 2022-08-01 NOTE — Telephone Encounter (Signed)
Contacted patient to scheduled appointments. Patient is aware of appointments that are scheduled.   

## 2022-08-15 NOTE — Progress Notes (Signed)
Patient Care Team: Lafonda Mosses, MD as PCP - General (Gynecologic Oncology) Nicholas Lose, MD as Consulting Physician (Hematology and Oncology) Stark Klein, MD as Consulting Physician (General Surgery)  DIAGNOSIS: No diagnosis found.  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 MRI Breast (11/21/13): large geographic areas of the right breast malignancy within the 6 to 8 o'clock position right breast lower outer quadrant extending from the chest wall to the retroareolar area of the right breast. Multiple suspicious nodules within the right axilla. 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    04/18/2014 Surgery   Bilateral mastectomies  Right: DCIS but no residual invasive carcinoma. Margins were uninvolved. Sentinel lymph node had a focus of metastatic carcinoma measuring 0.4 cm surgical stage was Tis N1A. Left: benign     Chemotherapy   Received neoadjuvant chemotherapy with Adriamycin and Cytoxan x4.  She then had surgery and then received adjuvant therapy with Taxol weekly x12        - 11/2018 Anti-estrogen oral therapy   Adjuvant antiestrogen therapy with tamoxifen completed June 2020   12/18/2021 Relapse/Recurrence   Palpable lesion in the right breast.  Mammogram and ultrasound at [facility] revealed recurrent nodules in the reconstructed right breast along with enlarged lymph nodes.  Biopsy of the breast and the lymph nodes was performed that revealed breast cancer that was ER/PR 100% positive HER2 negative.  Breast MRI revealed 3 masses in the right breast measuring 2.4, 2.2, 2.5 cm multiple enlarged axillary lymph nodes measuring up to 2.4 cm subpectoral lymph node 1.3 cm, liver 2.8 cm, 2.5 cm and 2.8 cm  masses     CHIEF COMPLIANT: Follow-up metastatic breast cancer on Letrozole Verzinio    INTERVAL HISTORY: Linda Gutierrez is a 47 y.o. female is here because of a history of CHEK2-related breast cancer. She presents to the clinic today for labs and follow-up.     ALLERGIES:  has No Known Allergies.  MEDICATIONS:  Current Outpatient Medications  Medication Sig Dispense Refill   BREO ELLIPTA 200-25 MCG/ACT AEPB Inhale 1 puff into the lungs daily as needed (shortness of breath).     busPIRone (BUSPAR) 15 MG tablet Take 15 mg by mouth 2 (two) times daily.     calcium carbonate (OS-CAL - DOSED IN MG OF ELEMENTAL CALCIUM) 1250 (500 Ca) MG tablet Take 1 tablet by mouth 3 (three) times daily with meals.     cholecalciferol (VITAMIN D3) 25 MCG (1000 UNIT) tablet Take 5,000 Units by mouth daily.     CLINPRO 5000 1.1 % PSTE Place 1 application  onto teeth.     letrozole (FEMARA) 2.5 MG tablet Take 1 tablet (2.5 mg total) by mouth daily. 90 tablet 3   levocetirizine (XYZAL) 5 MG tablet Take 5 mg by mouth every evening.     methocarbamol (ROBAXIN) 500 MG tablet Take 500 mg by mouth 3 (three) times daily as needed.     montelukast (SINGULAIR) 10 MG tablet Take 10 mg by mouth at bedtime.     ondansetron (ZOFRAN) 4 MG tablet Take 1 tablet (4 mg total) by mouth every 8 (eight) hours as needed for nausea or vomiting. 20 tablet 0   sertraline (ZOLOFT) 100 MG tablet Take 200 mg by mouth daily.  VERZENIO 100 MG tablet TAKE 1 TABLET BY MOUTH TWICE  DAILY 56 tablet 3   No current facility-administered medications for this visit.    PHYSICAL EXAMINATION: ECOG PERFORMANCE STATUS: {CHL ONC ECOG [phone]}  There were no vitals filed for this visit. There were no vitals filed for this visit.  BREAST:*** No palpable masses or nodules in either right or left breasts. No palpable axillary supraclavicular or infraclavicular adenopathy no breast tenderness or nipple discharge. (exam performed in the  presence of a chaperone)  LABORATORY DATA:  I have reviewed the data as listed    Latest Ref Rng & Units 05/28/2022    1:10 PM 04/03/2022   12:23 PM 03/05/2022    2:34 PM  CMP  Glucose 70 - 99 mg/dL 101  96  92   BUN 6 - 20 mg/dL '15  11  12   '$ Creatinine 0.44 - 1.00 mg/dL 0.97  0.93  1.04   Sodium 135 - 145 mmol/L 138  141  141   Potassium 3.5 - 5.1 mmol/L 4.1  4.5  4.0   Chloride 98 - 111 mmol/L 105  107  108   CO2 22 - 32 mmol/L '30  28  28   '$ Calcium 8.9 - 10.3 mg/dL 9.4  9.2  9.0   Total Protein 6.5 - 8.1 g/dL 7.5  7.7  7.4   Total Bilirubin 0.3 - 1.2 mg/dL 0.5  0.8  0.6   Alkaline Phos 38 - 126 U/L 56  56  56   AST 15 - 41 U/L 33  16  19   ALT 0 - 44 U/L 40  15  20     Lab Results  Component Value Date   WBC 5.5 05/28/2022   HGB 13.5 05/28/2022   HCT 37.3 05/28/2022   MCV 98.9 05/28/2022   PLT 228 05/28/2022   NEUTROABS 3.4 05/28/2022    ASSESSMENT & PLAN:  No problem-specific Assessment & Plan notes found for this encounter.    No orders of the defined types were placed in this encounter.  The patient has a good understanding of the overall plan. she agrees with it. she will call with any problems that may develop before the next visit here. Total time spent: 30 mins including face to face time and time spent for planning, charting and co-ordination of care   Suzzette Righter, [place] 08/15/22    I Gardiner Coins am acting as a Education administrator for [employer]  ***

## 2022-08-19 ENCOUNTER — Ambulatory Visit (HOSPITAL_COMMUNITY)
Admission: RE | Admit: 2022-08-19 | Discharge: 2022-08-19 | Disposition: A | Discharge status: Home / Self Care | Payer: 59 | Source: Ambulatory Visit | Attending: Hematology and Oncology | Admitting: Hematology and Oncology

## 2022-08-19 DIAGNOSIS — C50911 Malignant neoplasm of unspecified site of right female breast: Secondary | ICD-10-CM | POA: Diagnosis present

## 2022-08-19 MED ORDER — IOHEXOL 300 MG/ML  SOLN
100.0000 mL | Freq: Once | INTRAMUSCULAR | Status: AC | PRN
  Administered 2022-08-19: 100 mL via INTRAVENOUS

## 2022-08-20 ENCOUNTER — Inpatient Hospital Stay (HOSPITAL_BASED_OUTPATIENT_CLINIC_OR_DEPARTMENT_OTHER): Payer: 59 | Admitting: Hematology and Oncology

## 2022-08-20 ENCOUNTER — Other Ambulatory Visit: Payer: 59

## 2022-08-20 ENCOUNTER — Inpatient Hospital Stay: Payer: 59

## 2022-08-20 ENCOUNTER — Ambulatory Visit: Payer: 59

## 2022-08-20 ENCOUNTER — Ambulatory Visit: Payer: 59 | Admitting: Hematology and Oncology

## 2022-08-20 ENCOUNTER — Inpatient Hospital Stay: Payer: 59 | Attending: Adult Health

## 2022-08-20 ENCOUNTER — Ambulatory Visit: Payer: 59 | Admitting: Pharmacist

## 2022-08-20 VITALS — BP 140/88 | HR 88 | Temp 98.2°F | Resp 18

## 2022-08-20 VITALS — BP 146/101 | HR 89 | Temp 97.9°F | Resp 18 | Ht 68.0 in | Wt 248.9 lb

## 2022-08-20 DIAGNOSIS — Z1502 Genetic susceptibility to malignant neoplasm of ovary: Secondary | ICD-10-CM

## 2022-08-20 DIAGNOSIS — C7951 Secondary malignant neoplasm of bone: Secondary | ICD-10-CM | POA: Insufficient documentation

## 2022-08-20 DIAGNOSIS — C787 Secondary malignant neoplasm of liver and intrahepatic bile duct: Secondary | ICD-10-CM | POA: Insufficient documentation

## 2022-08-20 DIAGNOSIS — C50919 Malignant neoplasm of unspecified site of unspecified female breast: Secondary | ICD-10-CM

## 2022-08-20 DIAGNOSIS — C50811 Malignant neoplasm of overlapping sites of right female breast: Secondary | ICD-10-CM | POA: Insufficient documentation

## 2022-08-20 DIAGNOSIS — Z1589 Genetic susceptibility to other disease: Secondary | ICD-10-CM

## 2022-08-20 DIAGNOSIS — Z170 Estrogen receptor positive status [ER+]: Secondary | ICD-10-CM | POA: Insufficient documentation

## 2022-08-20 DIAGNOSIS — C50911 Malignant neoplasm of unspecified site of right female breast: Secondary | ICD-10-CM

## 2022-08-20 DIAGNOSIS — Z1509 Genetic susceptibility to other malignant neoplasm: Secondary | ICD-10-CM | POA: Diagnosis not present

## 2022-08-20 DIAGNOSIS — C773 Secondary and unspecified malignant neoplasm of axilla and upper limb lymph nodes: Secondary | ICD-10-CM | POA: Diagnosis not present

## 2022-08-20 LAB — CBC WITH DIFFERENTIAL (CANCER CENTER ONLY)
Abs Immature Granulocytes: 0.01 10*3/uL (ref 0.00–0.07)
Basophils Absolute: 0.1 10*3/uL (ref 0.0–0.1)
Basophils Relative: 1 %
Eosinophils Absolute: 0.1 10*3/uL (ref 0.0–0.5)
Eosinophils Relative: 3 %
HCT: 38.3 % (ref 36.0–46.0)
Hemoglobin: 13.8 g/dL (ref 12.0–15.0)
Immature Granulocytes: 0 %
Lymphocytes Relative: 20 %
Lymphs Abs: 0.9 10*3/uL (ref 0.7–4.0)
MCH: 34.1 pg — ABNORMAL HIGH (ref 26.0–34.0)
MCHC: 36 g/dL (ref 30.0–36.0)
MCV: 94.6 fL (ref 80.0–100.0)
Monocytes Absolute: 0.3 10*3/uL (ref 0.1–1.0)
Monocytes Relative: 7 %
Neutro Abs: 3.1 10*3/uL (ref 1.7–7.7)
Neutrophils Relative %: 69 %
Platelet Count: 230 10*3/uL (ref 150–400)
RBC: 4.05 MIL/uL (ref 3.87–5.11)
RDW: 12.5 % (ref 11.5–15.5)
WBC Count: 4.5 10*3/uL (ref 4.0–10.5)
nRBC: 0 % (ref 0.0–0.2)

## 2022-08-20 LAB — CMP (CANCER CENTER ONLY)
ALT: 24 U/L (ref 0–44)
AST: 18 U/L (ref 15–41)
Albumin: 4 g/dL (ref 3.5–5.0)
Alkaline Phosphatase: 50 U/L (ref 38–126)
Anion gap: 8 (ref 5–15)
BUN: 12 mg/dL (ref 6–20)
CO2: 24 mmol/L (ref 22–32)
Calcium: 9.1 mg/dL (ref 8.9–10.3)
Chloride: 107 mmol/L (ref 98–111)
Creatinine: 0.92 mg/dL (ref 0.44–1.00)
GFR, Estimated: >60 mL/min (ref >60)
Glucose, Bld: 150 mg/dL — ABNORMAL HIGH (ref 70–99)
Potassium: 3.7 mmol/L (ref 3.5–5.1)
Sodium: 139 mmol/L (ref 135–145)
Total Bilirubin: 0.7 mg/dL (ref 0.3–1.2)
Total Protein: 7.5 g/dL (ref 6.5–8.1)

## 2022-08-20 MED ORDER — SODIUM CHLORIDE 0.9 % IV SOLN: Freq: Once | INTRAVENOUS | Status: AC

## 2022-08-20 MED ORDER — ZOLEDRONIC ACID 4 MG/100ML IV SOLN
4.0000 mg | Freq: Once | INTRAVENOUS | Status: AC
  Administered 2022-08-20: 4 mg via INTRAVENOUS
  Filled 2022-08-20: qty 100

## 2022-08-20 NOTE — Assessment & Plan Note (Signed)
May 2015: Right breast lump, mammogram ultrasound and MRI showed multiple suspicious nodules within the right axilla.  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, lymph node biopsy: Positive, ER 100%, PR 100%, Ki-67 15%, HER2 equivocal 2+, FISH: 12/26/2021: PET/CT: Small hypermetabolic right chest wall nodules and right axillary lymphadenopathy consistent with recurrent breast cancer, numerous large hepatic metastatic lesions, small scattered pulmonary nodules, diffuse lytic/destructive osseous metastatic lesions especially at L4   Current treatment: letrozole and Verzinio started 01/03/2022 (Zoladex discontinued since she had BSO on 02/11/2022)   Oophorectomy Right hip replacement surgery L4 kyphoplasty: Does not show any evidence of breast cancer on the biopsies   05/24/2022: CT CAP: Significant partial response to treatment with decrease in size of the hepatic lesions from 5.7 cm to 3.5 cm, sclerotic changes in the previous bone lesions 08/19/2022: CT CAP:   Adverse effects: Hypokalemia: Recommended oral potassium replacement therapy 20 mEq daily. Diarrhea: Improved with Imodium Redness of hands and feet: Mild Continue with the current treatment plan because she is responding to treatment.   Scans will again be performed in 6 months.

## 2022-08-20 NOTE — Patient Instructions (Signed)

## 2022-08-26 ENCOUNTER — Telehealth: Payer: Self-pay | Admitting: *Deleted

## 2022-08-26 ENCOUNTER — Encounter: Payer: Self-pay | Admitting: Hematology and Oncology

## 2022-08-26 ENCOUNTER — Telehealth: Payer: Self-pay | Admitting: Hematology and Oncology

## 2022-08-26 NOTE — Telephone Encounter (Signed)
Received message from [facility] team stating pt has been approved for Xgeva r/t poor venous access with Zometa.  Message sent to scheduling team to arrange future injection appt.

## 2022-08-26 NOTE — Telephone Encounter (Signed)
I discussed with the patient that after reviewing her scans I do not feel that there is a significant difference in the right hepatic lobe lesion from before to now.  To my measurement it was relatively the same size.

## 2022-09-02 ENCOUNTER — Telehealth: Payer: Self-pay | Admitting: Hematology and Oncology

## 2022-09-02 NOTE — Telephone Encounter (Signed)
Scheduled appointment per staff message. Patient is aware of the made appointment. 

## 2022-10-10 IMAGING — US US THYROID
1 series · 14 of 25 positions shown · non-contrast
Comparison: August 12, 2019

CLINICAL DATA: Goiter

EXAM:
THYROID ULTRASOUND
TECHNIQUE: Ultrasound examination of the thyroid gland and adjacent soft
tissues was performed.

[Series 1: us thyroid · 14 of 60 slices shown]
[im 1/60]
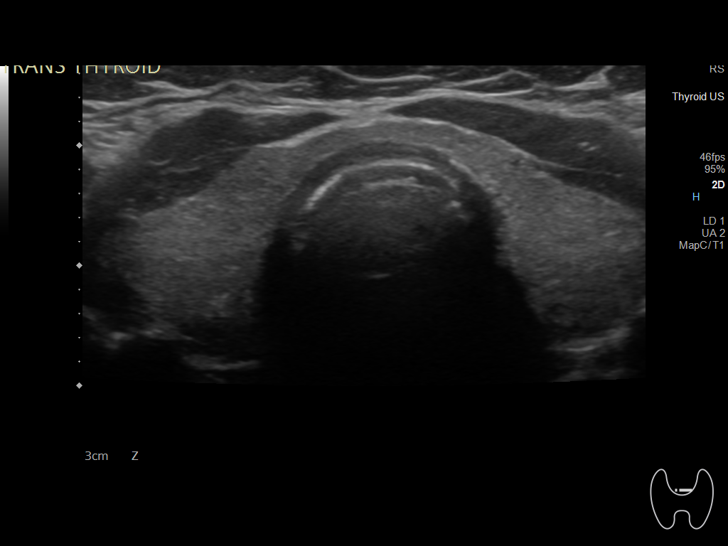
[im 5/60]
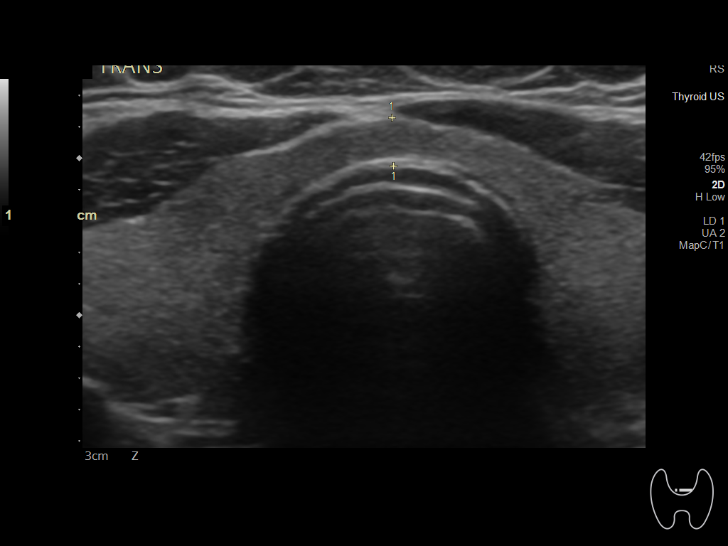
[im 10/60]
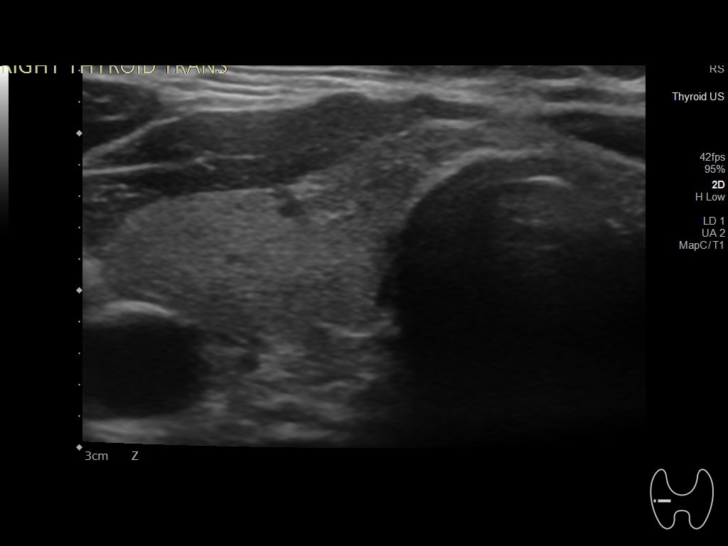
[im 15/60]
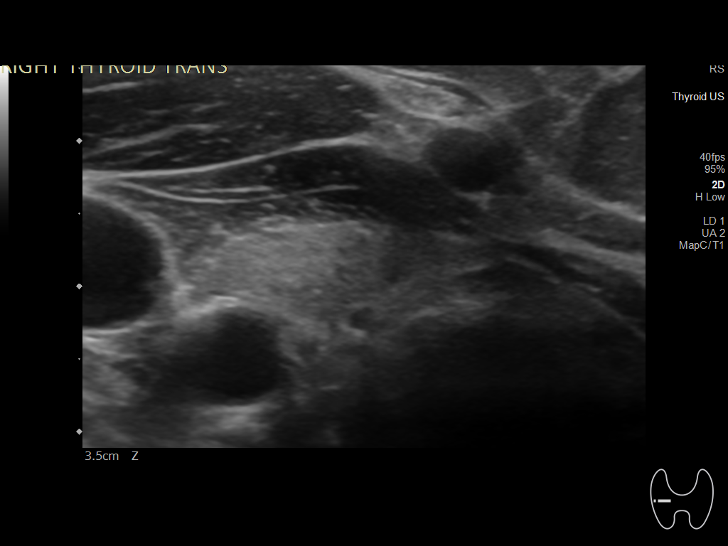
[im 20/60]
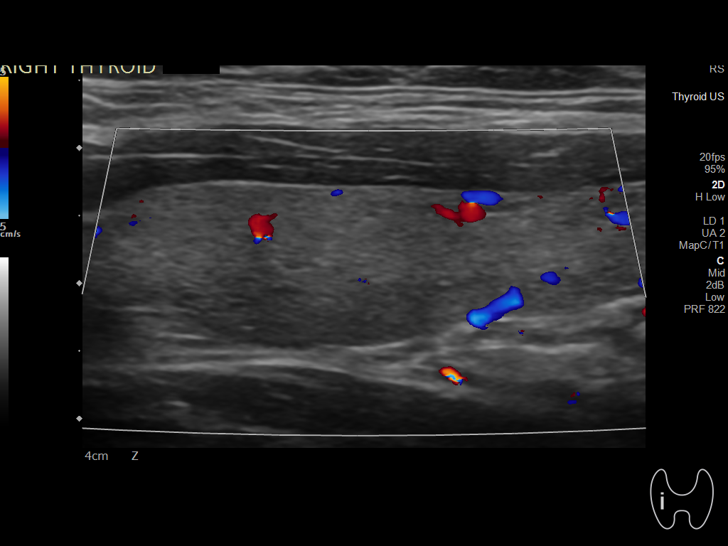
[im 23/60]
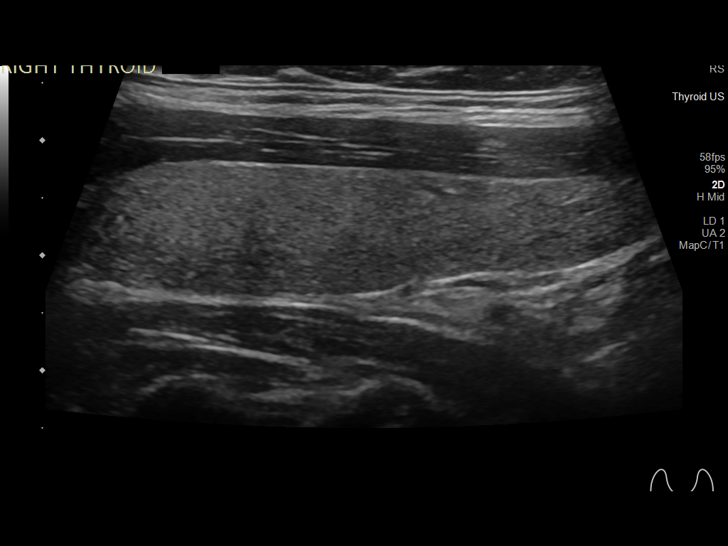
[im 28/60]
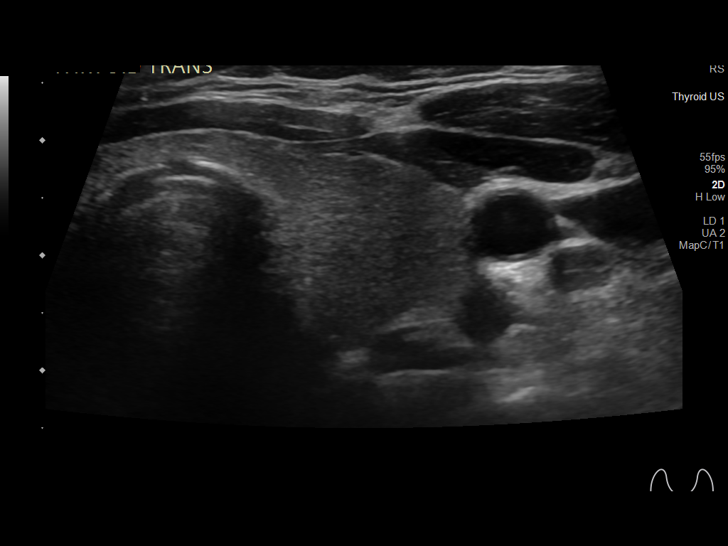
[im 32/60]
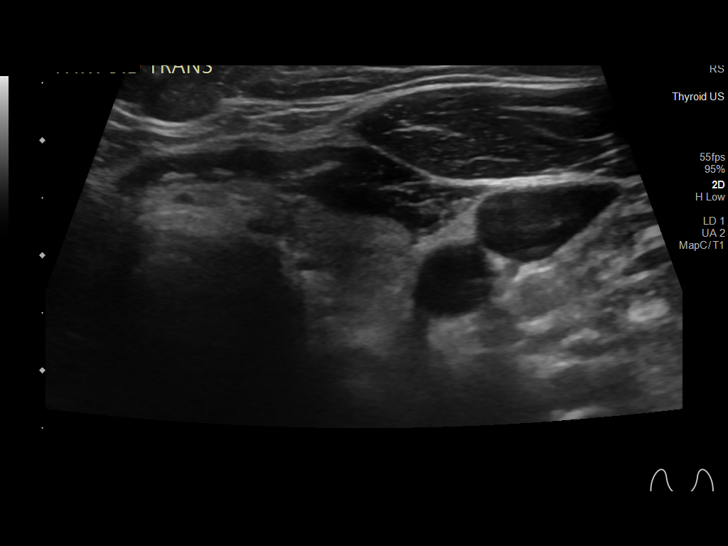
[im 37/60]
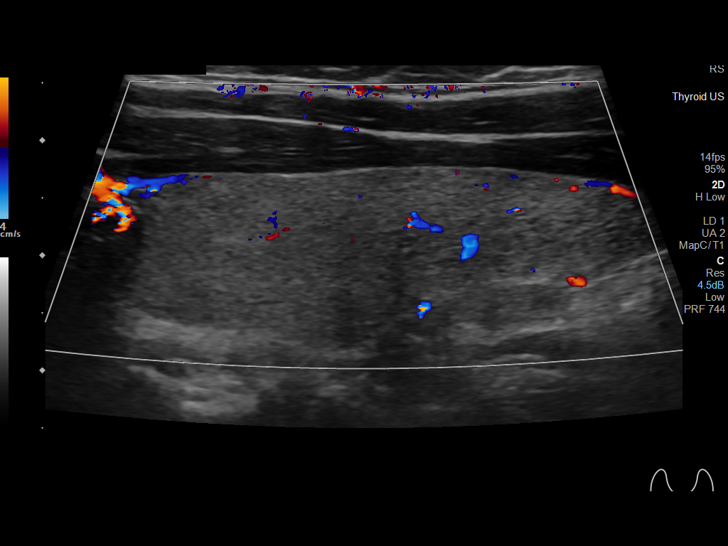
[im 40/60]
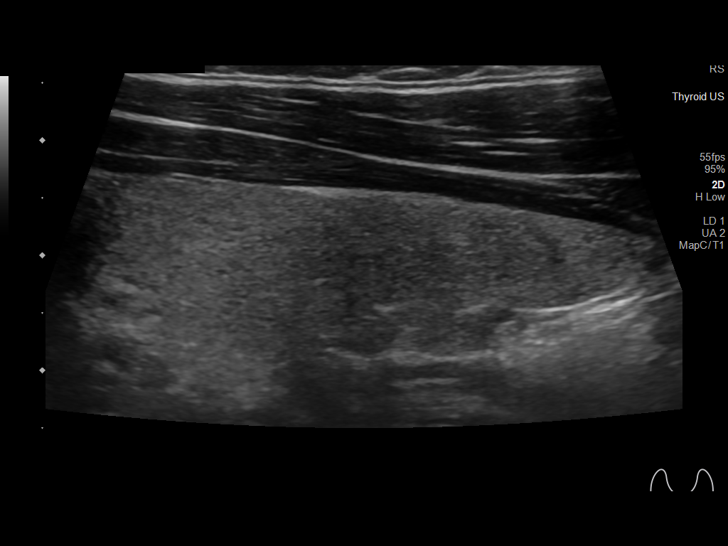
[im 45/60]
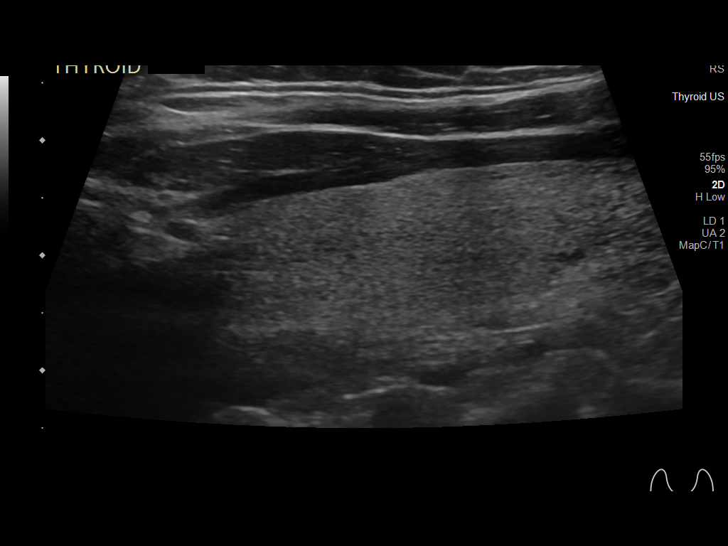
[im 50/60]
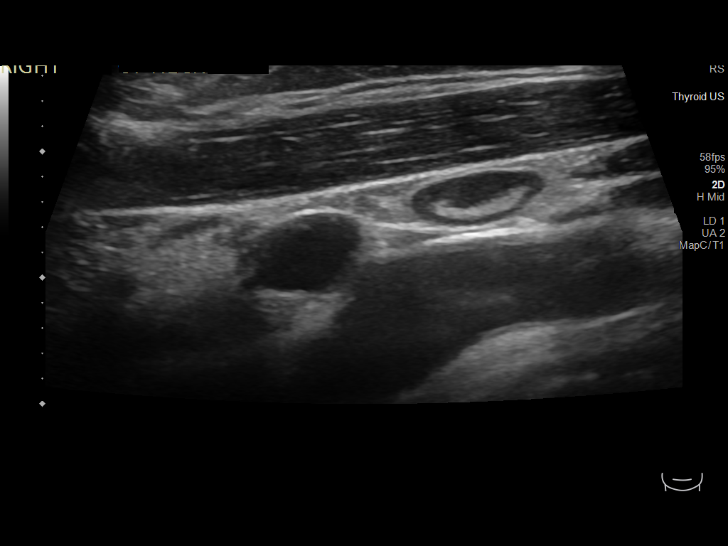
[im 55/60]
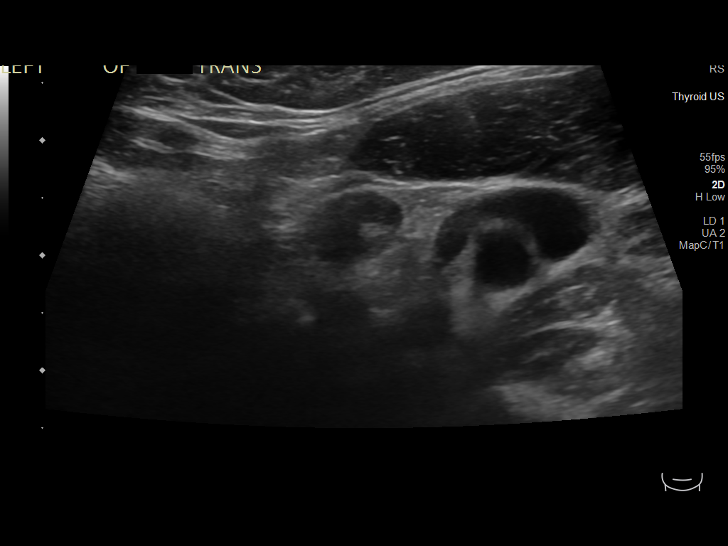
[im 60/60]
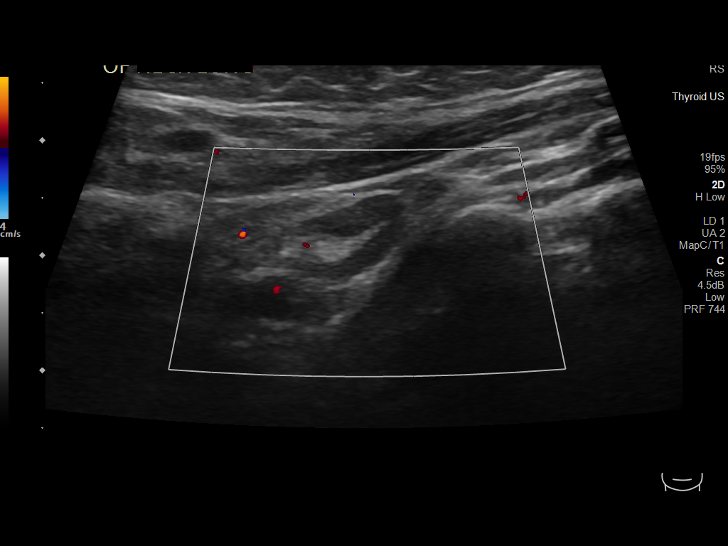

[14 of 25 positions shown; findings below may reference images not displayed]

FINDINGS: Parenchymal Echotexture: Normal

Isthmus: 0.3 cm

Right lobe: 5.2 x 1.1 x 2 cm

Left lobe: 5.1 x 1.4 x 1.7 cm

_________________________________________________________

Estimated total number of nodules >/= 1 cm: 0

Number of spongiform nodules >/=  2 cm not described below (TR1): 0

Number of mixed cystic and solid nodules >/= 1.5 cm not described
below (TR2): 0

_________________________________________________________

No discrete nodules are seen within the thyroid gland.
IMPRESSION: Normal study.

The above is in keeping with the ACR TI-RADS recommendations - [HOSPITAL] 8480;[DATE].

## 2022-10-20 ENCOUNTER — Other Ambulatory Visit: Payer: Self-pay | Admitting: Hematology and Oncology

## 2022-11-11 ENCOUNTER — Telehealth: Payer: Self-pay | Admitting: Hematology and Oncology

## 2022-11-11 NOTE — Telephone Encounter (Signed)
Rescheduled appointments per provider PAL. Left voicemail.  

## 2022-11-19 ENCOUNTER — Inpatient Hospital Stay: Payer: 59 | Attending: Adult Health

## 2022-11-19 ENCOUNTER — Inpatient Hospital Stay: Payer: 59

## 2022-11-19 ENCOUNTER — Inpatient Hospital Stay: Payer: 59 | Admitting: Adult Health

## 2022-11-19 ENCOUNTER — Other Ambulatory Visit: Payer: Self-pay

## 2022-11-19 VITALS — BP 155/96 | HR 86 | Temp 97.2°F | Resp 18 | Ht 68.0 in | Wt 240.8 lb

## 2022-11-19 DIAGNOSIS — C50911 Malignant neoplasm of unspecified site of right female breast: Secondary | ICD-10-CM

## 2022-11-19 DIAGNOSIS — Z1509 Genetic susceptibility to other malignant neoplasm: Secondary | ICD-10-CM

## 2022-11-19 DIAGNOSIS — Z79811 Long term (current) use of aromatase inhibitors: Secondary | ICD-10-CM | POA: Diagnosis not present

## 2022-11-19 DIAGNOSIS — C787 Secondary malignant neoplasm of liver and intrahepatic bile duct: Secondary | ICD-10-CM | POA: Diagnosis not present

## 2022-11-19 DIAGNOSIS — C50811 Malignant neoplasm of overlapping sites of right female breast: Secondary | ICD-10-CM | POA: Diagnosis not present

## 2022-11-19 DIAGNOSIS — Z1589 Genetic susceptibility to other disease: Secondary | ICD-10-CM | POA: Diagnosis not present

## 2022-11-19 DIAGNOSIS — C50919 Malignant neoplasm of unspecified site of unspecified female breast: Secondary | ICD-10-CM

## 2022-11-19 DIAGNOSIS — Z1502 Genetic susceptibility to malignant neoplasm of ovary: Secondary | ICD-10-CM | POA: Diagnosis not present

## 2022-11-19 DIAGNOSIS — Z170 Estrogen receptor positive status [ER+]: Secondary | ICD-10-CM | POA: Insufficient documentation

## 2022-11-19 DIAGNOSIS — C7951 Secondary malignant neoplasm of bone: Secondary | ICD-10-CM | POA: Insufficient documentation

## 2022-11-19 LAB — CBC WITH DIFFERENTIAL (CANCER CENTER ONLY)
Abs Immature Granulocytes: 0.03 10*3/uL (ref 0.00–0.07)
Basophils Absolute: 0.1 10*3/uL (ref 0.0–0.1)
Basophils Relative: 1 %
Eosinophils Absolute: 0.2 10*3/uL (ref 0.0–0.5)
Eosinophils Relative: 3 %
HCT: 40.1 % (ref 36.0–46.0)
Hemoglobin: 14.1 g/dL (ref 12.0–15.0)
Immature Granulocytes: 1 %
Lymphocytes Relative: 22 %
Lymphs Abs: 1 10*3/uL (ref 0.7–4.0)
MCH: 34.5 pg — ABNORMAL HIGH (ref 26.0–34.0)
MCHC: 35.2 g/dL (ref 30.0–36.0)
MCV: 98 fL (ref 80.0–100.0)
Monocytes Absolute: 0.4 10*3/uL (ref 0.1–1.0)
Monocytes Relative: 7 %
Neutro Abs: 3.1 10*3/uL (ref 1.7–7.7)
Neutrophils Relative %: 66 %
Platelet Count: 226 10*3/uL (ref 150–400)
RBC: 4.09 MIL/uL (ref 3.87–5.11)
RDW: 12.5 % (ref 11.5–15.5)
WBC Count: 4.7 10*3/uL (ref 4.0–10.5)
nRBC: 0 % (ref 0.0–0.2)

## 2022-11-19 LAB — CMP (CANCER CENTER ONLY)
ALT: 21 U/L (ref 0–44)
AST: 20 U/L (ref 15–41)
Albumin: 3.9 g/dL (ref 3.5–5.0)
Alkaline Phosphatase: 46 U/L (ref 38–126)
Anion gap: 6 (ref 5–15)
BUN: 9 mg/dL (ref 6–20)
CO2: 27 mmol/L (ref 22–32)
Calcium: 9.3 mg/dL (ref 8.9–10.3)
Chloride: 109 mmol/L (ref 98–111)
Creatinine: 0.74 mg/dL (ref 0.44–1.00)
GFR, Estimated: >60 mL/min (ref >60)
Glucose, Bld: 99 mg/dL (ref 70–99)
Potassium: 4.2 mmol/L (ref 3.5–5.1)
Sodium: 142 mmol/L (ref 135–145)
Total Bilirubin: 0.8 mg/dL (ref 0.3–1.2)
Total Protein: 7.7 g/dL (ref 6.5–8.1)

## 2022-11-19 MED ORDER — METHOCARBAMOL 500 MG PO TABS: 500.0000 mg | ORAL_TABLET | Freq: Three times a day (TID) | ORAL | 0 refills | Status: DC | PRN

## 2022-11-19 MED ORDER — DENOSUMAB 120 MG/1.7ML ~~LOC~~ SOLN
120.0000 mg | Freq: Once | SUBCUTANEOUS | Status: AC
  Administered 2022-11-19: 120 mg via SUBCUTANEOUS
  Filled 2022-11-19: qty 1.7

## 2022-11-19 MED ORDER — TRAMADOL HCL 50 MG PO TABS: 50.0000 mg | ORAL_TABLET | Freq: Four times a day (QID) | ORAL | 0 refills | Status: DC | PRN

## 2022-11-19 NOTE — Progress Notes (Signed)
[facility] Cancer Follow up:    Linda Reichmann, DO [address] [place] [address] [place] [address]   DIAGNOSIS:  Cancer Staging  No matching staging information was found for the patient.  SUMMARY OF ONCOLOGIC HISTORY: Oncology History  CHEK2-related breast cancer (HCC)  11/25/2013 Initial Diagnosis   CHEK2-related breast cancer [facility])  May 2015 she noted a lump in the right breast at 2 o'clock. MRI Breast (11/21/13): large geographic areas of the right breast malignancy within the 6 to 8 o'clock position right breast lower outer quadrant extending from the chest wall to the retroareolar area of the right breast. Multiple suspicious nodules within the right axilla. 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    04/18/2014 Surgery   Bilateral mastectomies  Right: DCIS but no residual invasive carcinoma. Margins were uninvolved. Sentinel lymph node had a focus of metastatic carcinoma measuring 0.4 cm surgical stage was Tis N1A. Left: benign     Chemotherapy   Received neoadjuvant chemotherapy with Adriamycin and Cytoxan x4.  She then had surgery and then received adjuvant therapy with Taxol weekly x12        - 11/2018 Anti-estrogen oral therapy   Adjuvant antiestrogen therapy with tamoxifen completed June 2020   12/18/2021 Relapse/Recurrence   Palpable lesion in the right breast.  Mammogram and ultrasound at [facility] revealed recurrent nodules in the reconstructed right breast along with enlarged lymph nodes.  Biopsy of the breast and the lymph nodes was performed that revealed breast cancer that was ER/PR 100% positive HER2 negative.  Breast MRI revealed 3 masses in the right breast measuring 2.4, 2.2, 2.5 cm multiple enlarged axillary lymph nodes measuring up to 2.4 cm subpectoral lymph node 1.3 cm, liver 2.8 cm, 2.5 cm and 2.8 cm masses     CURRENT THERAPY:  letrozole/verzenio  INTERVAL HISTORY: Linda Gutierrez 47 y.o. female returns for follow-up and evaluation while taking letrozole and Verzenio.  She is tolerating treatment well and denies any significant issues.  She has intermittent loose bowel movements but these are managed well.  She does not feel like they are causing her any difficulty.  Her most recent restaging scans in March 2024 demonstrated interval decrease in hepatic metastasis no new metastatic lesions in her liver, stable skeletal metastasis, no new or progressive disease in the abdomen pelvis, and no evidence of thoracic metastasis.   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  MEDICAL HISTORY: Past Medical History:  Diagnosis Date   Abnormal Pap smear of cervix 2016   HPV   Anxiety    Arthritis    Asthma    Breast cancer (HCC) 2015   rt side   Cancer (HCC)    metestatic- on liver   Depression    GERD (gastroesophageal reflux disease)    History of hiatal hernia    Pneumonia    STD (sexually transmitted  disease)    HPV   Urinary incontinence     SURGICAL HISTORY: Past Surgical History:  Procedure Laterality Date   BREAST RECONSTRUCTION     BREAST RECONSTRUCTION     with implants   CERVICAL BIOPSY  W/ LOOP ELECTRODE EXCISION     COLPOSCOPY     KYPHOPLASTY N/A 04/28/2022   Procedure: L4 BIOPSY AND KYPHOPLASTY;  Surgeon: Venita Lick, MD;  Location: [facility]  OR;  Service: Orthopedics;  Laterality: N/A;  90 mins   MASTECTOMY Bilateral 2017   ROBOTIC ASSISTED BILATERAL SALPINGO OOPHERECTOMY Bilateral 02/11/2022   Procedure: XI ROBOTIC ASSISTED BILATERAL SALPINGO OOPHORECTOMY;  Surgeon: Carver Fila, MD;  Location: [facility] ORS;  Service: Gynecology;  Laterality: Bilateral;   TOTAL VAGINAL HYSTERECTOMY      SOCIAL HISTORY: Social History   Socioeconomic History   Marital status: Married    Spouse name: Not on file   Number of children: Not on file   Years of education: Not on file   Highest education level: Not on file  Occupational History   Occupation: works as a Dietitian  Tobacco Use   Smoking status: Former    Packs/day: 0.50    Years: 10.00    Additional pack years: 0.00    Total pack years: 5.00    Types: Cigarettes    Quit date: 2015    Years since quitting: 9.4   Smokeless tobacco: Never  Vaping Use   Vaping Use: Never used  Substance and Sexual Activity   Alcohol use: Not Currently    Alcohol/week: 6.0 standard drinks of alcohol    Types: 6 Standard drinks or equivalent per week    Comment: "occasionally" less than 6 drinks/week   Drug use: Never   Sexual activity: Not Currently    Partners: Male    Birth control/protection: Surgical    Comment: hysterectomy 2017  Other Topics Concern   Not on file  Social History Narrative   Not on file   Social Determinants of Health   Financial Resource Strain: Not on file  Food Insecurity: Not on file  Transportation Needs: Not on file  Physical Activity: Not on file  Stress: Not on file  Social Connections: Not on file  Intimate Partner Violence: Not on file    FAMILY HISTORY: Family History  Problem Relation Age of Onset   Thyroid disease Mother    Uterine cancer Mother    Diabetes Father    Hypertension Father    Lupus Father    Diabetes Maternal Grandmother    Heart disease Maternal Grandmother    Colon cancer Neg Hx    Breast cancer Neg Hx     Ovarian cancer Neg Hx    Endometrial cancer Neg Hx    Pancreatic cancer Neg Hx    Prostate cancer Neg Hx     Review of Systems  Constitutional:  Positive for fatigue (Mild). Negative for appetite change, chills, fever and unexpected weight change.  HENT:   Negative for hearing loss, lump/mass and trouble swallowing.   Eyes:  Negative for eye problems and icterus.  Respiratory:  Negative for chest tightness, cough and shortness of breath.   Cardiovascular:  Negative for chest pain, leg swelling and palpitations.  Gastrointestinal:  Negative for abdominal distention, abdominal pain, constipation, diarrhea, nausea and vomiting.  Endocrine: Negative for hot flashes.  Genitourinary:  Negative for difficulty urinating.   Musculoskeletal:  Negative for arthralgias.  Skin:  Negative for itching and rash.  Neurological:  Negative for dizziness, extremity weakness, headaches and numbness.  Hematological:  Negative for adenopathy. Does not bruise/bleed easily.  Psychiatric/Behavioral:  Negative for depression. The patient is not nervous/anxious.       PHYSICAL EXAMINATION   Onc Performance Status - 11/19/22 1100       ECOG Perf Status   ECOG Perf Status Fully active, able to carry on all pre-disease performance without restriction      KPS SCALE   KPS % SCORE Normal, no compliants, no evidence of disease             Vitals:   11/19/22 1143  BP: (!) 155/96  Pulse: 86  Resp: 18  Temp: (!) 97.2 F (36.2 C)  SpO2: 98%    Physical Exam Constitutional:      General: She is not in acute distress.    Appearance: Normal appearance. She is not toxic-appearing.  HENT:     Head: Normocephalic and atraumatic.     Mouth/Throat:     Mouth: Mucous membranes are moist.     Pharynx: Oropharynx is clear. No oropharyngeal exudate or posterior oropharyngeal erythema.  Eyes:     General: No scleral icterus. Cardiovascular:     Rate and Rhythm: Normal rate and regular rhythm.     Pulses:  Normal pulses.     Heart sounds: Normal heart sounds.  Pulmonary:     Effort: Pulmonary effort is normal.     Breath sounds: Normal breath sounds.  Abdominal:     General: Abdomen is flat. Bowel sounds are normal. There is no distension.     Palpations: Abdomen is soft.     Tenderness: There is no abdominal tenderness.  Musculoskeletal:        General: No swelling.     Cervical back: Neck supple.  Lymphadenopathy:     Cervical: No cervical adenopathy.  Skin:    General: Skin is warm and dry.     Findings: No rash.  Neurological:     General: No focal deficit present.     Mental Status: She is alert.  Psychiatric:        Mood and Affect: Mood normal.        Behavior: Behavior normal.     LABORATORY DATA:  CBC    Component Value Date/Time   WBC 4.7 11/19/2022 1056   WBC 3.7 (L) 02/05/2022 1123   RBC 4.09 11/19/2022 1056   HGB 14.1 11/19/2022 1056   HCT 40.1 11/19/2022 1056   PLT 226 11/19/2022 1056   MCV 98.0 11/19/2022 1056   MCH 34.5 (H) 11/19/2022 1056   MCHC 35.2 11/19/2022 1056   RDW 12.5 11/19/2022 1056   LYMPHSABS 1.0 11/19/2022 1056   MONOABS 0.4 11/19/2022 1056   EOSABS 0.2 11/19/2022 1056   BASOSABS 0.1 11/19/2022 1056    CMP     Component Value Date/Time   NA 142 11/19/2022 1056   K 4.2 11/19/2022 1056   CL 109 11/19/2022 1056   CO2 27 11/19/2022 1056   GLUCOSE 99 11/19/2022 1056   BUN 9 11/19/2022 1056   CREATININE 0.74 11/19/2022 1056   CALCIUM 9.3 11/19/2022 1056   PROT 7.7 11/19/2022 1056   ALBUMIN 3.9 11/19/2022 1056   AST 20 11/19/2022 1056   ALT 21 11/19/2022 1056   ALKPHOS 46 11/19/2022 1056   BILITOT 0.8 11/19/2022 1056   GFRNONAA >60 11/19/2022 1056          ASSESSMENT and THERAPY PLAN:   CHEK2-related breast cancer (  [facility]) May 2015: Right breast lump, mammogram ultrasound and MRI showed multiple suspicious nodules within the right axilla.  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, lymph node biopsy: Positive, ER 100%, PR 100%, Ki-67 15%, HER2 equivocal 2+, FISH: 12/26/2021: PET/CT: Small hypermetabolic right chest wall nodules and right axillary lymphadenopathy consistent with recurrent breast cancer, numerous large hepatic metastatic lesions, small scattered pulmonary nodules, diffuse lytic/destructive osseous metastatic lesions especially at L4   Current treatment: letrozole and Verzinio started 01/03/2022 (Zoladex discontinued since she had BSO on 02/11/2022)   Oophorectomy Right hip replacement surgery L4 kyphoplasty: Does not show any evidence of breast cancer on the biopsies   05/24/2022: CT CAP: Significant partial response to treatment with decrease in size of the hepatic lesions from 5.7 cm to 3.5 cm, sclerotic changes in the previous bone lesions 08/19/2022: CT CAP: Improvement in liver metastasis.  Stable bone metastasis.  No evidence of progression otherwise in chest abdomen pelvis noted.   Adverse effects: Diarrhea: Managed with Imodium if needed. Redness of hands and feet: Mild 3.   Fatigue: Managed with energy conservation.  She will receive Xgeva today.  We reviewed this in detail and she is ready to proceed.  Restaging CT chest abdomen pelvis ordered for approximately 12 weeks from now.  She will return in 12 weeks for labs, follow-up, and Xgeva.  All questions were answered. The patient knows to call the clinic with any problems, questions or concerns. We can certainly see the patient much sooner if necessary.  Total encounter time:30 minutes*in face-to-face visit time, chart review, lab review, care coordination,  order entry, and documentation of the encounter time.    Lillard Anes, NP 11/20/22 11:52 AM Medical Oncology and Hematology [facility] [address] [place], [place] [address] Tel. [phone]    Fax. [phone]  *Total Encounter Time as defined by the Centers for Medicare and Medicaid Services includes, in addition to the face-to-face time of a patient visit (documented in the note above) non-face-to-face time: obtaining and reviewing outside history, ordering and reviewing medications, tests or procedures, care coordination (communications with other health care professionals or caregivers) and documentation in the medical record.

## 2022-11-19 NOTE — Progress Notes (Signed)
Proceed with Xgeva today. DC Zometa orders.  Anola Gurney [place], [place], BCPS, BCOP 11/19/2022 12:48 PM

## 2022-11-19 NOTE — Patient Instructions (Signed)
Denosumab Injection (Oncology) What is this medication? DENOSUMAB (den oh SUE mab) prevents weakened bones caused by cancer. It may also be used to treat noncancerous bone tumors that cannot be removed by surgery. It can also be used to treat high calcium levels in the blood caused by cancer. It works by blocking a protein that causes bones to break down quickly. This slows down the release of calcium from bones, which lowers calcium levels in your blood. It also makes your bones stronger and less likely to break (fracture). This medicine may be used for other purposes; ask your health care provider or pharmacist if you have questions. COMMON BRAND NAME(S): XGEVA What should I tell my care team before I take this medication? They need to know if you have any of these conditions: Dental disease Having surgery or tooth extraction Infection Kidney disease Low levels of calcium or vitamin D in the blood Malnutrition On hemodialysis Skin conditions or sensitivity Thyroid or parathyroid disease An unusual reaction to denosumab, other medications, foods, dyes, or preservatives Pregnant or trying to get pregnant Breast-feeding How should I use this medication? This medication is for injection under the skin. It is given by your care team in a hospital or clinic setting. A special MedGuide will be given to you before each treatment. Be sure to read this information carefully each time. Talk to your care team about the use of this medication in children. While it may be prescribed for children as young as 13 years for selected conditions, precautions do apply. Overdosage: If you think you have taken too much of this medicine contact a poison control center or emergency room at once. NOTE: This medicine is only for you. Do not share this medicine with others. What if I miss a dose? Keep appointments for follow-up doses. It is important not to miss your dose. Call your care team if you are unable to  keep an appointment. What may interact with this medication? Do not take this medication with any of the following: Other medications containing denosumab This medication may also interact with the following: Medications that lower your chance of fighting infection Steroid medications, such as prednisone or cortisone This list may not describe all possible interactions. Give your health care provider a list of all the medicines, herbs, non-prescription drugs, or dietary supplements you use. Also tell them if you smoke, drink alcohol, or use illegal drugs. Some items may interact with your medicine. What should I watch for while using this medication? Your condition will be monitored carefully while you are receiving this medication. You may need blood work while taking this medication. This medication may increase your risk of getting an infection. Call your care team for advice if you get a fever, chills, sore throat, or other symptoms of a cold or flu. Do not treat yourself. Try to avoid being around people who are sick. You should make sure you get enough calcium and vitamin D while you are taking this medication, unless your care team tells you not to. Discuss the foods you eat and the vitamins you take with your care team. Some people who take this medication have severe bone, joint, or muscle pain. This medication may also increase your risk for jaw problems or a broken thigh bone. Tell your care team right away if you have severe pain in your jaw, bones, joints, or muscles. Tell your care team if you have any pain that does not go away or that gets worse. Talk   to your care team if you may be pregnant. Serious birth defects can occur if you take this medication during pregnancy and for 5 months after the last dose. You will need a negative pregnancy test before starting this medication. Contraception is recommended while taking this medication and for 5 months after the last dose. Your care team  can help you find the option that works for you. What side effects may I notice from receiving this medication? Side effects that you should report to your care team as soon as possible: Allergic reactions--skin rash, itching, hives, swelling of the face, lips, tongue, or throat Bone, joint, or muscle pain Low calcium level--muscle pain or cramps, confusion, tingling, or numbness in the hands or feet Osteonecrosis of the jaw--pain, swelling, or redness in the mouth, numbness of the jaw, poor healing after dental work, unusual discharge from the mouth, visible bones in the mouth Side effects that usually do not require medical attention (report to your care team if they continue or are bothersome): Cough Diarrhea Fatigue Headache Nausea This list may not describe all possible side effects. Call your doctor for medical advice about side effects. You may report side effects to FDA at 1-800-FDA-1088. Where should I keep my medication? This medication is given in a hospital or clinic. It will not be stored at home. NOTE: This sheet is a summary. It may not cover all possible information. If you have questions about this medicine, talk to your doctor, pharmacist, or health care provider.  2024 Elsevier/Gold Standard (2021-10-16 00:00:00)  

## 2022-11-20 ENCOUNTER — Other Ambulatory Visit: Payer: 59

## 2022-11-20 ENCOUNTER — Ambulatory Visit: Payer: 59 | Admitting: Hematology and Oncology

## 2022-11-20 ENCOUNTER — Ambulatory Visit: Payer: 59

## 2022-11-20 ENCOUNTER — Encounter: Payer: Self-pay | Admitting: Hematology and Oncology

## 2022-11-20 NOTE — Assessment & Plan Note (Signed)
May 2015: Right breast lump, mammogram ultrasound and MRI showed multiple suspicious nodules within the right axilla.  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, lymph node biopsy: Positive, ER 100%, PR 100%, Ki-67 15%, HER2 equivocal 2+, FISH: 12/26/2021: PET/CT: Small hypermetabolic right chest wall nodules and right axillary lymphadenopathy consistent with recurrent breast cancer, numerous large hepatic metastatic lesions, small scattered pulmonary nodules, diffuse lytic/destructive osseous metastatic lesions especially at L4   Current treatment: letrozole and Verzinio started 01/03/2022 (Zoladex discontinued since she had BSO on 02/11/2022)   Oophorectomy Right hip replacement surgery L4 kyphoplasty: Does not show any evidence of breast cancer on the biopsies   05/24/2022: CT CAP: Significant partial response to treatment with decrease in size of the hepatic lesions from 5.7 cm to 3.5 cm, sclerotic changes in the previous bone lesions 08/19/2022: CT CAP: Improvement in liver metastasis.  Stable bone metastasis.  No evidence of progression otherwise in chest abdomen pelvis noted.   Adverse effects: Diarrhea: Managed with Imodium if needed. Redness of hands and feet: Mild 3.   Fatigue: Managed with energy conservation.  She will receive Xgeva today.  We reviewed this in detail and she is ready to proceed.  Restaging CT chest abdomen pelvis ordered for approximately 12 weeks from now.  She will return in 12 weeks for labs, follow-up, and Xgeva.

## 2022-12-17 ENCOUNTER — Other Ambulatory Visit (HOSPITAL_COMMUNITY): Payer: Self-pay

## 2022-12-17 ENCOUNTER — Telehealth: Payer: Self-pay | Admitting: Pharmacy Technician

## 2022-12-17 NOTE — Telephone Encounter (Signed)
Oral Oncology Patient Advocate Encounter   Received notification that prior authorization for Verzenio is due for renewal.   PA submitted on 12/17/22 Key [id number] Request completed via phone [phone]  Status is pending     Jinger Neighbors, CPhT-Adv Oncology Pharmacy Patient Advocate [facility] Direct Number: [phone]  Fax: [phone]

## 2022-12-18 NOTE — Telephone Encounter (Signed)
Oral Oncology Patient Advocate Encounter  Prior Authorization for Linda Gutierrez has been approved.    PA# [id number] Effective dates: 12/18/22 through 12/18/23  Patient must continue to fill at [facility].    Jinger Neighbors, CPhT-Adv Oncology Pharmacy Patient Advocate [facility] Direct Number: [phone]  Fax: [phone]

## 2023-01-28 ENCOUNTER — Ambulatory Visit (HOSPITAL_COMMUNITY)
Admission: RE | Admit: 2023-01-28 | Discharge: 2023-01-28 | Disposition: A | Discharge status: Home / Self Care | Payer: 59 | Source: Ambulatory Visit | Attending: Adult Health | Admitting: Adult Health

## 2023-01-28 DIAGNOSIS — C50911 Malignant neoplasm of unspecified site of right female breast: Secondary | ICD-10-CM | POA: Diagnosis present

## 2023-01-28 MED ORDER — IOHEXOL 300 MG/ML  SOLN
100.0000 mL | Freq: Once | INTRAMUSCULAR | Status: AC | PRN
  Administered 2023-01-28: 100 mL via INTRAVENOUS

## 2023-02-02 ENCOUNTER — Encounter: Payer: Self-pay | Admitting: Hematology and Oncology

## 2023-02-10 ENCOUNTER — Other Ambulatory Visit: Payer: Self-pay | Admitting: *Deleted

## 2023-02-10 DIAGNOSIS — C50911 Malignant neoplasm of unspecified site of right female breast: Secondary | ICD-10-CM

## 2023-02-10 NOTE — Progress Notes (Signed)
Patient Care Team: Irena Reichmann, DO as PCP - General (Family Medicine) Serena Croissant, MD as Consulting Physician (Hematology and Oncology) Almond Lint, MD as Consulting Physician (General Surgery)  DIAGNOSIS:  Encounter Diagnosis  Name Primary?   CHEK2-related breast cancer (HCC) Yes    SUMMARY OF ONCOLOGIC HISTORY: Oncology History  CHEK2-related breast cancer (HCC)  11/25/2013 Initial Diagnosis   CHEK2-related breast cancer [facility])  May 2015 she noted a lump in the right breast at 2 o'clock. MRI Breast (11/21/13): large geographic areas of the right breast malignancy within the 6 to 8 o'clock position right breast lower outer quadrant extending from the chest wall to the retroareolar area of the right breast. Multiple suspicious nodules within the right axilla. 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    04/18/2014 Surgery   Bilateral mastectomies  Right: DCIS but no residual invasive carcinoma. Margins were uninvolved. Sentinel lymph node had a focus of metastatic carcinoma measuring 0.4 cm surgical stage was Tis N1A. Left: benign     Chemotherapy   Received neoadjuvant chemotherapy with Adriamycin and Cytoxan x4.  She then had surgery and then received adjuvant therapy with Taxol weekly x12        - 11/2018 Anti-estrogen oral therapy   Adjuvant antiestrogen therapy with tamoxifen completed June 2020   12/18/2021 Relapse/Recurrence   Palpable lesion in the right breast.  Mammogram and ultrasound at [facility] revealed recurrent nodules in the reconstructed right breast along with enlarged lymph nodes.  Biopsy of the breast and the lymph nodes was performed that revealed breast cancer that was ER/PR 100% positive HER2 negative.  Breast MRI revealed 3 masses in the right breast measuring 2.4, 2.2, 2.5 cm multiple enlarged axillary lymph nodes measuring up to 2.4 cm subpectoral lymph  node 1.3 cm, liver 2.8 cm, 2.5 cm and 2.8 cm masses     CHIEF COMPLIANT: Follow-up metastatic breast cancer on Letrozole Verzinio   INTERVAL HISTORY: Bedelia Ferrare is a 47 y.o. female is here because of a history of CHEK2-related breast cancer. She presents to the clinic today for labs and follow-up.  She reports that the Verzinio is no longer causing her diarrhea.  Energy levels are also excellent.  She is tolerating the drug extremely well.  She had a CT scan and is here today to discuss results.  Overall the scan shows stable disease.  There was surprising a soft tissue nodule at the L4 vertebral body area and an MRI was recommended.   ALLERGIES:  has No Known Allergies.  MEDICATIONS:  Current Outpatient Medications  Medication Sig Dispense Refill   BREO ELLIPTA 200-25 MCG/ACT AEPB Inhale 1 puff into the lungs daily as needed (shortness of breath).     busPIRone (BUSPAR) 15 MG tablet Take 15 mg by mouth 2 (two) times daily.     calcium carbonate (OS-CAL - DOSED IN MG OF ELEMENTAL CALCIUM) 1250 (500 Ca) MG tablet Take 1 tablet by mouth 3 (three) times daily with meals.     cholecalciferol (VITAMIN D3) 25 MCG (1000 UNIT) tablet Take 5,000 Units by mouth daily.     letrozole (FEMARA) 2.5 MG tablet Take 1 tablet (2.5 mg total) by mouth daily. 90 tablet 3   levocetirizine (XYZAL) 5 MG tablet Take 5 mg by mouth every evening.     methocarbamol (ROBAXIN) 500 MG tablet Take 1 tablet (500 mg total) by mouth 3 (three) times daily as needed. 20  tablet 0   montelukast (SINGULAIR) 10 MG tablet Take 10 mg by mouth at bedtime.     ondansetron (ZOFRAN) 4 MG tablet Take 1 tablet (4 mg total) by mouth every 8 (eight) hours as needed for nausea or vomiting. 20 tablet 0   sertraline (ZOLOFT) 100 MG tablet Take 200 mg by mouth daily.     traMADol (ULTRAM) 50 MG tablet Take 1 tablet (50 mg total) by mouth every 6 (six) hours as needed. 15 tablet 0   VERZENIO 100 MG tablet TAKE 1 TABLET BY MOUTH TWICE  DAILY  56 tablet 3   No current facility-administered medications for this visit.   Facility-Administered Medications Ordered in Other Visits  Medication Dose Route Frequency Provider Last Rate Last Admin   denosumab (XGEVA) injection 120 mg  120 mg Subcutaneous Once Serena Croissant, MD        PHYSICAL EXAMINATION: ECOG PERFORMANCE STATUS: 1 - Symptomatic but completely ambulatory  Vitals:   02/11/23 1134  BP: (!) 148/92  Pulse: 80  Resp: 18  Temp: 97.6 F (36.4 C)  SpO2: 100%   Filed Weights   02/11/23 1134  Weight: 239 lb 11.2 oz (108.7 kg)      LABORATORY DATA:  I have reviewed the data as listed    Latest Ref Rng & Units 02/11/2023   11:19 AM 11/19/2022   10:56 AM 08/20/2022   11:13 AM  CMP  Glucose 70 - 99 mg/dL 161  99  096   BUN 6 - 20 mg/dL 11  9  12    Creatinine 0.44 - 1.00 mg/dL 0.45  4.09  8.11   Sodium 135 - 145 mmol/L 140  142  139   Potassium 3.5 - 5.1 mmol/L 4.6  4.2  3.7   Chloride 98 - 111 mmol/L 106  109  107   CO2 22 - 32 mmol/L 29  27  24    Calcium 8.9 - 10.3 mg/dL 9.1  9.3  9.1   Total Protein 6.5 - 8.1 g/dL 7.5  7.7  7.5   Total Bilirubin 0.3 - 1.2 mg/dL 0.8  0.8  0.7   Alkaline Phos 38 - 126 U/L 50  46  50   AST 15 - 41 U/L 16  20  18    ALT 0 - 44 U/L 16  21  24      Lab Results  Component Value Date   WBC 4.6 02/11/2023   HGB 14.0 02/11/2023   HCT 39.3 02/11/2023   MCV 98.3 02/11/2023   PLT 208 02/11/2023   NEUTROABS 2.9 02/11/2023    ASSESSMENT & PLAN:  CHEK2-related breast cancer [facility]) May 2015: Right breast lump, mammogram ultrasound and MRI showed multiple suspicious nodules within the right axilla.  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 December 2015: Adjuvant Taxol x12 Adjuvant radiation 2015-2020: Adjuvant  tamoxifen ------------------------------------------------------------- 12/12/2021: Recurrence of breast cancer: Grade 3 IDC, lymph node biopsy: Positive, ER 100%, PR 100%, Ki-67 15%, HER2 equivocal 2+, FISH: 12/26/2021: PET/CT: Small hypermetabolic right chest wall nodules and right axillary lymphadenopathy consistent with recurrent breast cancer, numerous large hepatic metastatic lesions, small scattered pulmonary nodules, diffuse lytic/destructive osseous metastatic lesions especially at L4   Current treatment: letrozole and Verzinio started 01/03/2022 (Zoladex discontinued since she had BSO on 02/11/2022)   Oophorectomy Right hip replacement surgery L4 kyphoplasty: Does not show any evidence  of breast cancer on the biopsies   05/24/2022: CT CAP: Significant partial response to treatment with decrease in size of the hepatic lesions from 5.7 cm to 3.5 cm, sclerotic changes in the previous bone lesions 08/19/2022: CT CAP: Improvement in liver metastasis.  Stable bone metastasis.  No evidence of progression otherwise in chest abdomen pelvis noted. 02/04/2023: CT CAP: Stable liver and bone mets, ST nodule L4 (needs MRI)   Adverse effects: Diarrhea: Resolved Redness of hands and feet: Mild 3.   Fatigue: Markedly better  Based on stable CT findings,we will continue current therapy  We will obtain an MRI of the lumbar spine to evaluate the soft tissue nodule at T4.  Otherwise scans will be every 6 months.  I will see her back in 3 months with labs injections and follow-up.    Orders Placed This Encounter  Procedures   MR Lumbar Spine W Contrast    Standing Status:   Future    Standing Expiration Date:   02/11/2024    Order Specific Question:   If indicated for the ordered procedure, I authorize the administration of contrast media per Radiology protocol    Answer:   Yes    Order Specific Question:   What is the patient's sedation requirement?    Answer:   No Sedation    Order Specific  Question:   Does the patient have a pacemaker or implanted devices?    Answer:   No    Order Specific Question:   Preferred imaging location?    Answer:   [facility] (table limit - 550 lbs)   The patient has a good understanding of the overall plan. she agrees with it. she will call with any problems that may develop before the next visit here. Total time spent: 30 mins including face to face time and time spent for planning, charting and co-ordination of care   Tamsen Meek, MD 02/11/23    I Janan Ridge am acting as a Neurosurgeon for [employer]  I have reviewed the above documentation for accuracy and completeness, and I agree with the above.

## 2023-02-11 ENCOUNTER — Inpatient Hospital Stay: Payer: 59

## 2023-02-11 ENCOUNTER — Inpatient Hospital Stay: Payer: 59 | Admitting: Hematology and Oncology

## 2023-02-11 ENCOUNTER — Inpatient Hospital Stay: Payer: 59 | Attending: Adult Health

## 2023-02-11 VITALS — BP 148/92 | HR 80 | Temp 97.6°F | Resp 18 | Ht 68.0 in | Wt 239.7 lb

## 2023-02-11 DIAGNOSIS — Z79811 Long term (current) use of aromatase inhibitors: Secondary | ICD-10-CM | POA: Diagnosis not present

## 2023-02-11 DIAGNOSIS — C50919 Malignant neoplasm of unspecified site of unspecified female breast: Secondary | ICD-10-CM

## 2023-02-11 DIAGNOSIS — C50912 Malignant neoplasm of unspecified site of left female breast: Secondary | ICD-10-CM | POA: Insufficient documentation

## 2023-02-11 DIAGNOSIS — C7951 Secondary malignant neoplasm of bone: Secondary | ICD-10-CM | POA: Insufficient documentation

## 2023-02-11 DIAGNOSIS — Z1589 Genetic susceptibility to other disease: Secondary | ICD-10-CM

## 2023-02-11 DIAGNOSIS — C50911 Malignant neoplasm of unspecified site of right female breast: Secondary | ICD-10-CM

## 2023-02-11 DIAGNOSIS — Z170 Estrogen receptor positive status [ER+]: Secondary | ICD-10-CM | POA: Diagnosis not present

## 2023-02-11 DIAGNOSIS — Z1509 Genetic susceptibility to other malignant neoplasm: Secondary | ICD-10-CM | POA: Diagnosis not present

## 2023-02-11 DIAGNOSIS — C773 Secondary and unspecified malignant neoplasm of axilla and upper limb lymph nodes: Secondary | ICD-10-CM | POA: Diagnosis not present

## 2023-02-11 DIAGNOSIS — Z1502 Genetic susceptibility to malignant neoplasm of ovary: Secondary | ICD-10-CM

## 2023-02-11 LAB — CBC WITH DIFFERENTIAL (CANCER CENTER ONLY)
Abs Immature Granulocytes: 0.02 10*3/uL (ref 0.00–0.07)
Basophils Absolute: 0.1 10*3/uL (ref 0.0–0.1)
Basophils Relative: 1 %
Eosinophils Absolute: 0.1 10*3/uL (ref 0.0–0.5)
Eosinophils Relative: 3 %
HCT: 39.3 % (ref 36.0–46.0)
Hemoglobin: 14 g/dL (ref 12.0–15.0)
Immature Granulocytes: 0 %
Lymphocytes Relative: 25 %
Lymphs Abs: 1.1 10*3/uL (ref 0.7–4.0)
MCH: 35 pg — ABNORMAL HIGH (ref 26.0–34.0)
MCHC: 35.6 g/dL (ref 30.0–36.0)
MCV: 98.3 fL (ref 80.0–100.0)
Monocytes Absolute: 0.4 10*3/uL (ref 0.1–1.0)
Monocytes Relative: 8 %
Neutro Abs: 2.9 10*3/uL (ref 1.7–7.7)
Neutrophils Relative %: 63 %
Platelet Count: 208 10*3/uL (ref 150–400)
RBC: 4 MIL/uL (ref 3.87–5.11)
RDW: 12.3 % (ref 11.5–15.5)
WBC Count: 4.6 10*3/uL (ref 4.0–10.5)
nRBC: 0 % (ref 0.0–0.2)

## 2023-02-11 LAB — CMP (CANCER CENTER ONLY)
ALT: 16 U/L (ref 0–44)
AST: 16 U/L (ref 15–41)
Albumin: 3.9 g/dL (ref 3.5–5.0)
Alkaline Phosphatase: 50 U/L (ref 38–126)
Anion gap: 5 (ref 5–15)
BUN: 11 mg/dL (ref 6–20)
CO2: 29 mmol/L (ref 22–32)
Calcium: 9.1 mg/dL (ref 8.9–10.3)
Chloride: 106 mmol/L (ref 98–111)
Creatinine: 0.83 mg/dL (ref 0.44–1.00)
GFR, Estimated: >60 mL/min (ref >60)
Glucose, Bld: 105 mg/dL — ABNORMAL HIGH (ref 70–99)
Potassium: 4.6 mmol/L (ref 3.5–5.1)
Sodium: 140 mmol/L (ref 135–145)
Total Bilirubin: 0.8 mg/dL (ref 0.3–1.2)
Total Protein: 7.5 g/dL (ref 6.5–8.1)

## 2023-02-11 MED ORDER — DENOSUMAB 120 MG/1.7ML ~~LOC~~ SOLN
120.0000 mg | Freq: Once | SUBCUTANEOUS | Status: AC
  Administered 2023-02-11: 120 mg via SUBCUTANEOUS
  Filled 2023-02-11: qty 1.7

## 2023-02-11 NOTE — Assessment & Plan Note (Addendum)
May 2015: Right breast lump, mammogram ultrasound and MRI showed multiple suspicious nodules within the right axilla.  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, lymph node biopsy: Positive, ER 100%, PR 100%, Ki-67 15%, HER2 equivocal 2+, FISH: 12/26/2021: PET/CT: Small hypermetabolic right chest wall nodules and right axillary lymphadenopathy consistent with recurrent breast cancer, numerous large hepatic metastatic lesions, small scattered pulmonary nodules, diffuse lytic/destructive osseous metastatic lesions especially at L4   Current treatment: letrozole and Verzinio started 01/03/2022 (Zoladex discontinued since she had BSO on 02/11/2022)   Oophorectomy Right hip replacement surgery L4 kyphoplasty: Does not show any evidence of breast cancer on the biopsies   05/24/2022: CT CAP: Significant partial response to treatment with decrease in size of the hepatic lesions from 5.7 cm to 3.5 cm, sclerotic changes in the previous bone lesions 08/19/2022: CT CAP: Improvement in liver metastasis.  Stable bone metastasis.  No evidence of progression otherwise in chest abdomen pelvis noted. 02/04/2023: CT CAP: Stable liver and bone mets, ST nodule L4 (needs MRI)   Adverse effects: Diarrhea: Resolved Redness of hands and feet: Mild 3.   Fatigue: Markedly better  Based on stable CT findings,we will continue current therapy  We will obtain an MRI of the lumbar spine to evaluate the soft tissue nodule at T4.  Otherwise scans will be every 6 months.  I will see her back in 3 months with labs injections and follow-up.

## 2023-02-16 ENCOUNTER — Other Ambulatory Visit: Payer: Self-pay | Admitting: Hematology and Oncology

## 2023-02-20 ENCOUNTER — Ambulatory Visit (HOSPITAL_COMMUNITY)
Admission: RE | Admit: 2023-02-20 | Discharge: 2023-02-20 | Disposition: A | Discharge status: Home / Self Care | Payer: 59 | Source: Ambulatory Visit | Attending: Hematology and Oncology | Admitting: Hematology and Oncology

## 2023-02-20 DIAGNOSIS — Z1509 Genetic susceptibility to other malignant neoplasm: Secondary | ICD-10-CM | POA: Diagnosis present

## 2023-02-20 DIAGNOSIS — Z1589 Genetic susceptibility to other disease: Secondary | ICD-10-CM | POA: Diagnosis present

## 2023-02-20 DIAGNOSIS — C50919 Malignant neoplasm of unspecified site of unspecified female breast: Secondary | ICD-10-CM | POA: Diagnosis present

## 2023-02-20 DIAGNOSIS — Z1502 Genetic susceptibility to malignant neoplasm of ovary: Secondary | ICD-10-CM | POA: Insufficient documentation

## 2023-02-20 MED ORDER — GADOBUTROL 1 MMOL/ML IV SOLN
10.0000 mL | Freq: Once | INTRAVENOUS | Status: AC | PRN
  Administered 2023-02-20: 10 mL via INTRAVENOUS

## 2023-03-09 ENCOUNTER — Encounter: Payer: Self-pay | Admitting: Hematology and Oncology

## 2023-04-03 ENCOUNTER — Ambulatory Visit: Payer: 59

## 2023-04-30 ENCOUNTER — Other Ambulatory Visit: Payer: Self-pay | Admitting: Hematology and Oncology

## 2023-05-05 ENCOUNTER — Other Ambulatory Visit: Payer: Self-pay

## 2023-05-05 DIAGNOSIS — C50911 Malignant neoplasm of unspecified site of right female breast: Secondary | ICD-10-CM

## 2023-05-06 ENCOUNTER — Inpatient Hospital Stay: Payer: 59

## 2023-05-06 ENCOUNTER — Inpatient Hospital Stay (HOSPITAL_BASED_OUTPATIENT_CLINIC_OR_DEPARTMENT_OTHER): Payer: 59 | Admitting: Hematology and Oncology

## 2023-05-06 ENCOUNTER — Inpatient Hospital Stay: Payer: 59 | Attending: Adult Health

## 2023-05-06 VITALS — BP 120/70 | HR 86 | Temp 97.4°F | Resp 16 | Wt 242.6 lb

## 2023-05-06 DIAGNOSIS — C50911 Malignant neoplasm of unspecified site of right female breast: Secondary | ICD-10-CM | POA: Diagnosis not present

## 2023-05-06 DIAGNOSIS — C7951 Secondary malignant neoplasm of bone: Secondary | ICD-10-CM | POA: Diagnosis present

## 2023-05-06 DIAGNOSIS — C50919 Malignant neoplasm of unspecified site of unspecified female breast: Secondary | ICD-10-CM | POA: Diagnosis not present

## 2023-05-06 DIAGNOSIS — C773 Secondary and unspecified malignant neoplasm of axilla and upper limb lymph nodes: Secondary | ICD-10-CM | POA: Insufficient documentation

## 2023-05-06 DIAGNOSIS — Z1502 Genetic susceptibility to malignant neoplasm of ovary: Secondary | ICD-10-CM | POA: Diagnosis not present

## 2023-05-06 DIAGNOSIS — Z79811 Long term (current) use of aromatase inhibitors: Secondary | ICD-10-CM | POA: Diagnosis not present

## 2023-05-06 DIAGNOSIS — Z170 Estrogen receptor positive status [ER+]: Secondary | ICD-10-CM | POA: Diagnosis not present

## 2023-05-06 DIAGNOSIS — Z1589 Genetic susceptibility to other disease: Secondary | ICD-10-CM

## 2023-05-06 DIAGNOSIS — Z1509 Genetic susceptibility to other malignant neoplasm: Secondary | ICD-10-CM

## 2023-05-06 LAB — CMP (CANCER CENTER ONLY)
ALT: 20 U/L (ref 0–44)
AST: 18 U/L (ref 15–41)
Albumin: 4 g/dL (ref 3.5–5.0)
Alkaline Phosphatase: 55 U/L (ref 38–126)
Anion gap: 6 (ref 5–15)
BUN: 11 mg/dL (ref 6–20)
CO2: 28 mmol/L (ref 22–32)
Calcium: 9.3 mg/dL (ref 8.9–10.3)
Chloride: 105 mmol/L (ref 98–111)
Creatinine: 0.8 mg/dL (ref 0.44–1.00)
GFR, Estimated: >60 mL/min (ref >60)
Glucose, Bld: 107 mg/dL — ABNORMAL HIGH (ref 70–99)
Potassium: 4.3 mmol/L (ref 3.5–5.1)
Sodium: 139 mmol/L (ref 135–145)
Total Bilirubin: 0.8 mg/dL (ref <1.2)
Total Protein: 7.9 g/dL (ref 6.5–8.1)

## 2023-05-06 LAB — CBC WITH DIFFERENTIAL (CANCER CENTER ONLY)
Abs Immature Granulocytes: 0.02 10*3/uL (ref 0.00–0.07)
Basophils Absolute: 0.1 10*3/uL (ref 0.0–0.1)
Basophils Relative: 1 %
Eosinophils Absolute: 0.2 10*3/uL (ref 0.0–0.5)
Eosinophils Relative: 3 %
HCT: 40.6 % (ref 36.0–46.0)
Hemoglobin: 14.5 g/dL (ref 12.0–15.0)
Immature Granulocytes: 0 %
Lymphocytes Relative: 21 %
Lymphs Abs: 1.2 10*3/uL (ref 0.7–4.0)
MCH: 35 pg — ABNORMAL HIGH (ref 26.0–34.0)
MCHC: 35.7 g/dL (ref 30.0–36.0)
MCV: 98.1 fL (ref 80.0–100.0)
Monocytes Absolute: 0.5 10*3/uL (ref 0.1–1.0)
Monocytes Relative: 8 %
Neutro Abs: 3.9 10*3/uL (ref 1.7–7.7)
Neutrophils Relative %: 67 %
Platelet Count: 235 10*3/uL (ref 150–400)
RBC: 4.14 MIL/uL (ref 3.87–5.11)
RDW: 12.1 % (ref 11.5–15.5)
WBC Count: 5.9 10*3/uL (ref 4.0–10.5)
nRBC: 0 % (ref 0.0–0.2)

## 2023-05-06 MED ORDER — DENOSUMAB 120 MG/1.7ML ~~LOC~~ SOLN
120.0000 mg | Freq: Once | SUBCUTANEOUS | Status: AC
Start: 2023-05-06 — End: 2023-05-06
  Administered 2023-05-06: 120 mg via SUBCUTANEOUS
  Filled 2023-05-06: qty 1.7

## 2023-05-06 NOTE — Progress Notes (Signed)
Patient Care Team: Irena Reichmann, DO as PCP - General (Family Medicine) Serena Croissant, MD as Consulting Physician (Hematology and Oncology) Almond Lint, MD as Consulting Physician (General Surgery)  DIAGNOSIS:  Encounter Diagnoses  Name Primary?   CHEK2-related breast cancer (HCC) Yes   Cancer, metastatic to bone (HCC)     SUMMARY OF ONCOLOGIC HISTORY: Oncology History  CHEK2-related breast cancer (HCC)  11/25/2013 Initial Diagnosis   CHEK2-related breast cancer [facility])  May 2015 she noted a lump in the right breast at 2 o'clock. MRI Breast (11/21/13): large geographic areas of the right breast malignancy within the 6 to 8 o'clock position right breast lower outer quadrant extending from the chest wall to the retroareolar area of the right breast. Multiple suspicious nodules within the right axilla. 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    04/18/2014 Surgery   Bilateral mastectomies  Right: DCIS but no residual invasive carcinoma. Margins were uninvolved. Sentinel lymph node had a focus of metastatic carcinoma measuring 0.4 cm surgical stage was Tis N1A. Left: benign     Chemotherapy   Received neoadjuvant chemotherapy with Adriamycin and Cytoxan x4.  She then had surgery and then received adjuvant therapy with Taxol weekly x12        - 11/2018 Anti-estrogen oral therapy   Adjuvant antiestrogen therapy with tamoxifen completed June 2020   12/18/2021 Relapse/Recurrence   Palpable lesion in the right breast.  Mammogram and ultrasound at [facility] revealed recurrent nodules in the reconstructed right breast along with enlarged lymph nodes.  Biopsy of the breast and the lymph nodes was performed that revealed breast cancer that was ER/PR 100% positive HER2 negative.  Breast MRI revealed 3 masses in the right breast measuring 2.4, 2.2, 2.5 cm multiple enlarged axillary lymph nodes  measuring up to 2.4 cm subpectoral lymph node 1.3 cm, liver 2.8 cm, 2.5 cm and 2.8 cm masses     CHIEF COMPLIANT: Follow-up on Verzinio and letrozole  HISTORY OF PRESENT ILLNESS:  History of Present Illness   The patient, with a history of cancer, presents for a follow-up visit. She reports that the [facility] treatment is going well and the previously troublesome diarrhea is now manageable. She does not need to take Imodium regularly. She also reports that she no longer experiences sudden urges to use the restroom while in public.  The patient is also on Xgeva therapy and inquired about the duration of this treatment. She reports no side effects from the [place] and finds it easier than previous infusion treatments.  The patient also mentions a concern about her blood pressure readings. She noticed that when her arm is elevated during the reading, the blood pressure appears normal. She plans to continue this practice for future readings.         ALLERGIES:  has No Known Allergies.  MEDICATIONS:  Current Outpatient Medications  Medication Sig Dispense Refill   BREO ELLIPTA 200-25 MCG/ACT AEPB Inhale 1 puff into the lungs daily as needed (shortness of breath).     busPIRone (BUSPAR) 15 MG tablet Take 15 mg by mouth 2 (two) times daily.     calcium carbonate (OS-CAL - DOSED IN MG OF ELEMENTAL CALCIUM) 1250 (500 Ca) MG tablet Take 1 tablet by mouth 3 (three) times daily with meals.     cholecalciferol (VITAMIN D3) 25 MCG (1000 UNIT) tablet Take 5,000 Units by mouth daily.     letrozole (FEMARA) 2.5 MG  tablet Take 1 tablet by mouth daily. 90 tablet 3   levocetirizine (XYZAL) 5 MG tablet Take 5 mg by mouth every evening.     methocarbamol (ROBAXIN) 500 MG tablet Take 1 tablet (500 mg total) by mouth 3 (three) times daily as needed. 20 tablet 0   montelukast (SINGULAIR) 10 MG tablet Take 10 mg by mouth at bedtime.     ondansetron (ZOFRAN) 4 MG tablet Take 1 tablet (4 mg total) by mouth every 8  (eight) hours as needed for nausea or vomiting. 20 tablet 0   sertraline (ZOLOFT) 100 MG tablet Take 200 mg by mouth daily.     traMADol (ULTRAM) 50 MG tablet Take 1 tablet (50 mg total) by mouth every 6 (six) hours as needed. 15 tablet 0   VERZENIO 100 MG tablet TAKE 1 TABLET BY MOUTH TWICE  DAILY 56 tablet 3   No current facility-administered medications for this visit.   Facility-Administered Medications Ordered in Other Visits  Medication Dose Route Frequency Provider Last Rate Last Admin   denosumab (XGEVA) injection 120 mg  120 mg Subcutaneous Once Serena Croissant, MD        PHYSICAL EXAMINATION: ECOG PERFORMANCE STATUS: 1 - Symptomatic but completely ambulatory  Vitals:   05/06/23 1112  BP: 120/70  Pulse: 86  Resp: 16  Temp: (!) 97.4 F (36.3 C)  SpO2: 99%   Filed Weights   05/06/23 1112  Weight: 242 lb 9.6 oz (110 kg)    Physical Exam   VITALS: Blood pressure normal with arm positioned at heart level.      (exam performed in the presence of a chaperone)  LABORATORY DATA:  I have reviewed the data as listed    Latest Ref Rng & Units 05/06/2023   10:55 AM 02/11/2023   11:19 AM 11/19/2022   10:56 AM  CMP  Glucose 70 - 99 mg/dL 409  811  99   BUN 6 - 20 mg/dL 11  11  9    Creatinine 0.44 - 1.00 mg/dL 9.14  7.82  9.56   Sodium 135 - 145 mmol/L 139  140  142   Potassium 3.5 - 5.1 mmol/L 4.3  4.6  4.2   Chloride 98 - 111 mmol/L 105  106  109   CO2 22 - 32 mmol/L 28  29  27    Calcium 8.9 - 10.3 mg/dL 9.3  9.1  9.3   Total Protein 6.5 - 8.1 g/dL 7.9  7.5  7.7   Total Bilirubin <1.2 mg/dL 0.8  0.8  0.8   Alkaline Phos 38 - 126 U/L 55  50  46   AST 15 - 41 U/L 18  16  20    ALT 0 - 44 U/L 20  16  21      Lab Results  Component Value Date   WBC 5.9 05/06/2023   HGB 14.5 05/06/2023   HCT 40.6 05/06/2023   MCV 98.1 05/06/2023   PLT 235 05/06/2023   NEUTROABS 3.9 05/06/2023    ASSESSMENT & PLAN:  CHEK2-related breast cancer [facility]) May 2015: Right breast lump,  mammogram ultrasound and MRI showed multiple suspicious nodules within the right axilla.  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, lymph node biopsy: Positive, ER 100%, PR  100%, Ki-67 15%, HER2 equivocal 2+, FISH: 12/26/2021: PET/CT: Small hypermetabolic right chest wall nodules and right axillary lymphadenopathy consistent with recurrent breast cancer, numerous large hepatic metastatic lesions, small scattered pulmonary nodules, diffuse lytic/destructive osseous metastatic lesions especially at L4   Current treatment: letrozole and Verzinio started 01/03/2022 (Zoladex discontinued since she had BSO on 02/11/2022)   Oophorectomy Right hip replacement surgery L4 kyphoplasty: Does not show any evidence of breast cancer on the biopsies   05/24/2022: CT CAP: Significant partial response to treatment with decrease in size of the hepatic lesions from 5.7 cm to 3.5 cm, sclerotic changes in the previous bone lesions 08/19/2022: CT CAP: Improvement in liver metastasis.  Stable bone metastasis.  No evidence of progression otherwise in chest abdomen pelvis noted. 02/04/2023: CT CAP: Stable liver and bone mets, ST nodule L4  03/10/2023: MRI  L Spine: no mets, stable kyphoplasty at L4, deg disc changes   Adverse effects: Diarrhea: Resolved Redness of hands and feet: Mild 3.   Fatigue: Markedly better   Based on stable CT findings,we will continue current therapy Bone metastasis: Xgeva scans will be every 6 months.  F/U in 3 months with scans and for injection and labs     Orders Placed This Encounter  Procedures   CT CHEST  ABDOMEN PELVIS W CONTRAST    Standing Status:   Future    Standing Expiration Date:   05/05/2024    Order Specific Question:   If indicated for the ordered procedure, I authorize the administration of contrast media per Radiology protocol    Answer:   Yes    Order Specific Question:   Does the patient have a contrast media/X-ray dye allergy?    Answer:   No    Order Specific Question:   Is patient pregnant?    Answer:   No    Order Specific Question:   Preferred imaging location?    Answer:   [facility]    Order Specific Question:   Release to patient    Answer:   Immediate    Order Specific Question:   If indicated for the ordered procedure, I authorize the administration of oral contrast media per Radiology protocol    Answer:   Yes   The patient has a good understanding of the overall plan. she agrees with it. she will call with any problems that may develop before the next visit here. Total time spent: 30 mins including face to face time and time spent for planning, charting and co-ordination of care   Tamsen Meek, MD 05/06/23

## 2023-05-06 NOTE — Assessment & Plan Note (Addendum)
May 2015: Right breast lump, mammogram ultrasound and MRI showed multiple suspicious nodules within the right axilla.  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, lymph node biopsy: Positive, ER 100%, PR 100%, Ki-67 15%, HER2 equivocal 2+, FISH: 12/26/2021: PET/CT: Small hypermetabolic right chest wall nodules and right axillary lymphadenopathy consistent with recurrent breast cancer, numerous large hepatic metastatic lesions, small scattered pulmonary nodules, diffuse lytic/destructive osseous metastatic lesions especially at L4   Current treatment: letrozole and Verzinio started 01/03/2022 (Zoladex discontinued since she had BSO on 02/11/2022)   Oophorectomy Right hip replacement surgery L4 kyphoplasty: Does not show any evidence of breast cancer on the biopsies   05/24/2022: CT CAP: Significant partial response to treatment with decrease in size of the hepatic lesions from 5.7 cm to 3.5 cm, sclerotic changes in the previous bone lesions 08/19/2022: CT CAP: Improvement in liver metastasis.  Stable bone metastasis.  No evidence of progression otherwise in chest abdomen pelvis noted. 02/04/2023: CT CAP: Stable liver and bone mets, ST nodule L4  03/10/2023: MRI  L Spine: no mets, stable kyphoplasty at L4, deg disc changes   Adverse effects: Diarrhea: Resolved Redness of hands and feet: Mild 3.   Fatigue: Markedly better   Based on stable CT findings,we will continue current therapy  scans will be every 6 months.  F/U in 3 months with scans and for injection and labs

## 2023-07-14 ENCOUNTER — Telehealth: Payer: Self-pay | Admitting: Hematology and Oncology

## 2023-07-14 NOTE — Telephone Encounter (Signed)
Patient called to schedule 3 month f/u visit; lab, md visit and injection.

## 2023-08-06 ENCOUNTER — Encounter (HOSPITAL_COMMUNITY): Payer: Self-pay

## 2023-08-06 ENCOUNTER — Ambulatory Visit (HOSPITAL_COMMUNITY): Payer: 59

## 2023-08-08 ENCOUNTER — Ambulatory Visit (HOSPITAL_COMMUNITY)
Admission: RE | Admit: 2023-08-08 | Discharge: 2023-08-08 | Disposition: A | Discharge status: Home / Self Care | Payer: 59 | Source: Ambulatory Visit | Attending: Hematology and Oncology

## 2023-08-08 DIAGNOSIS — Z1502 Genetic susceptibility to malignant neoplasm of ovary: Secondary | ICD-10-CM | POA: Insufficient documentation

## 2023-08-08 DIAGNOSIS — C50919 Malignant neoplasm of unspecified site of unspecified female breast: Secondary | ICD-10-CM | POA: Diagnosis present

## 2023-08-08 DIAGNOSIS — Z1589 Genetic susceptibility to other disease: Secondary | ICD-10-CM | POA: Insufficient documentation

## 2023-08-08 DIAGNOSIS — C7951 Secondary malignant neoplasm of bone: Secondary | ICD-10-CM | POA: Insufficient documentation

## 2023-08-08 DIAGNOSIS — Z1509 Genetic susceptibility to other malignant neoplasm: Secondary | ICD-10-CM | POA: Insufficient documentation

## 2023-08-08 MED ORDER — IOHEXOL 300 MG/ML  SOLN
100.0000 mL | Freq: Once | INTRAMUSCULAR | Status: AC | PRN
  Administered 2023-08-08: 100 mL via INTRAVENOUS

## 2023-08-12 ENCOUNTER — Other Ambulatory Visit: Payer: Self-pay | Admitting: *Deleted

## 2023-08-12 DIAGNOSIS — C50911 Malignant neoplasm of unspecified site of right female breast: Secondary | ICD-10-CM

## 2023-08-13 ENCOUNTER — Inpatient Hospital Stay: Payer: 59 | Attending: Hematology and Oncology

## 2023-08-13 ENCOUNTER — Inpatient Hospital Stay: Payer: 59 | Admitting: Hematology and Oncology

## 2023-08-13 ENCOUNTER — Inpatient Hospital Stay: Payer: 59

## 2023-08-13 VITALS — BP 123/69 | HR 77 | Temp 98.3°F | Resp 17 | Ht 68.0 in | Wt 256.5 lb

## 2023-08-13 DIAGNOSIS — C50919 Malignant neoplasm of unspecified site of unspecified female breast: Secondary | ICD-10-CM | POA: Diagnosis not present

## 2023-08-13 DIAGNOSIS — C7951 Secondary malignant neoplasm of bone: Secondary | ICD-10-CM | POA: Diagnosis present

## 2023-08-13 DIAGNOSIS — Z853 Personal history of malignant neoplasm of breast: Secondary | ICD-10-CM | POA: Insufficient documentation

## 2023-08-13 DIAGNOSIS — Z1589 Genetic susceptibility to other disease: Secondary | ICD-10-CM

## 2023-08-13 DIAGNOSIS — C50911 Malignant neoplasm of unspecified site of right female breast: Secondary | ICD-10-CM

## 2023-08-13 DIAGNOSIS — Z1509 Genetic susceptibility to other malignant neoplasm: Secondary | ICD-10-CM | POA: Diagnosis not present

## 2023-08-13 DIAGNOSIS — Z1502 Genetic susceptibility to malignant neoplasm of ovary: Secondary | ICD-10-CM

## 2023-08-13 LAB — CMP (CANCER CENTER ONLY)
ALT: 35 U/L (ref 0–44)
AST: 30 U/L (ref 15–41)
Albumin: 4 g/dL (ref 3.5–5.0)
Alkaline Phosphatase: 46 U/L (ref 38–126)
Anion gap: 3 — ABNORMAL LOW (ref 5–15)
BUN: 12 mg/dL (ref 6–20)
CO2: 28 mmol/L (ref 22–32)
Calcium: 8.7 mg/dL — ABNORMAL LOW (ref 8.9–10.3)
Chloride: 106 mmol/L (ref 98–111)
Creatinine: 0.83 mg/dL (ref 0.44–1.00)
GFR, Estimated: >60 mL/min (ref >60)
Glucose, Bld: 110 mg/dL — ABNORMAL HIGH (ref 70–99)
Potassium: 4.4 mmol/L (ref 3.5–5.1)
Sodium: 137 mmol/L (ref 135–145)
Total Bilirubin: 0.8 mg/dL (ref 0.0–1.2)
Total Protein: 7.4 g/dL (ref 6.5–8.1)

## 2023-08-13 LAB — CBC WITH DIFFERENTIAL (CANCER CENTER ONLY)
Abs Immature Granulocytes: 0.04 10*3/uL (ref 0.00–0.07)
Basophils Absolute: 0.1 10*3/uL (ref 0.0–0.1)
Basophils Relative: 1 %
Eosinophils Absolute: 0.2 10*3/uL (ref 0.0–0.5)
Eosinophils Relative: 4 %
HCT: 37.7 % (ref 36.0–46.0)
Hemoglobin: 13.3 g/dL (ref 12.0–15.0)
Immature Granulocytes: 1 %
Lymphocytes Relative: 22 %
Lymphs Abs: 1.2 10*3/uL (ref 0.7–4.0)
MCH: 33.9 pg (ref 26.0–34.0)
MCHC: 35.3 g/dL (ref 30.0–36.0)
MCV: 96.2 fL (ref 80.0–100.0)
Monocytes Absolute: 0.5 10*3/uL (ref 0.1–1.0)
Monocytes Relative: 9 %
Neutro Abs: 3.5 10*3/uL (ref 1.7–7.7)
Neutrophils Relative %: 63 %
Platelet Count: 228 10*3/uL (ref 150–400)
RBC: 3.92 MIL/uL (ref 3.87–5.11)
RDW: 12.3 % (ref 11.5–15.5)
WBC Count: 5.6 10*3/uL (ref 4.0–10.5)
nRBC: 0 % (ref 0.0–0.2)

## 2023-08-13 MED ORDER — DENOSUMAB 120 MG/1.7ML ~~LOC~~ SOLN
120.0000 mg | Freq: Once | SUBCUTANEOUS | Status: AC
  Administered 2023-08-13: 120 mg via SUBCUTANEOUS
  Filled 2023-08-13: qty 1.7

## 2023-08-13 NOTE — Progress Notes (Signed)
 Patient Care Team: Irena Reichmann, DO as PCP - General (Family Medicine) Serena Croissant, MD as Consulting Physician (Hematology and Oncology) Almond Lint, MD as Consulting Physician (General Surgery)  DIAGNOSIS:  Encounter Diagnosis  Name Primary?   CHEK2-related breast cancer (HCC) Yes    SUMMARY OF ONCOLOGIC HISTORY: Oncology History  CHEK2-related breast cancer (HCC)  11/25/2013 Initial Diagnosis   CHEK2-related breast cancer [facility])  May 2015 she noted a lump in the right breast at 2 o'clock. MRI Breast (11/21/13): large geographic areas of the right breast malignancy within the 6 to 8 o'clock position right breast lower outer quadrant extending from the chest wall to the retroareolar area of the right breast. Multiple suspicious nodules within the right axilla. 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    04/18/2014 Surgery   Bilateral mastectomies  Right: DCIS but no residual invasive carcinoma. Margins were uninvolved. Sentinel lymph node had a focus of metastatic carcinoma measuring 0.4 cm surgical stage was Tis N1A. Left: benign     Chemotherapy   Received neoadjuvant chemotherapy with Adriamycin and Cytoxan x4.  She then had surgery and then received adjuvant therapy with Taxol weekly x12        - 11/2018 Anti-estrogen oral therapy   Adjuvant antiestrogen therapy with tamoxifen completed June 2020   12/18/2021 Relapse/Recurrence   Palpable lesion in the right breast.  Mammogram and ultrasound at [facility] revealed recurrent nodules in the reconstructed right breast along with enlarged lymph nodes.  Biopsy of the breast and the lymph nodes was performed that revealed breast cancer that was ER/PR 100% positive HER2 negative.  Breast MRI revealed 3 masses in the right breast measuring 2.4, 2.2, 2.5 cm multiple enlarged axillary lymph nodes measuring up to 2.4 cm subpectoral lymph  node 1.3 cm, liver 2.8 cm, 2.5 cm and 2.8 cm masses     CHIEF COMPLIANT: Follow-up to review scans  HISTORY OF PRESENT ILLNESS:   History of Present Illness The patient, with a history of multiple spots in the liver and bones, presents for a routine follow-up. The patient reports that the spots have remained stable, with no new spots identified. The patient also reports experiencing fatigue, which she attributes to her current treatment regimen. Despite the fatigue, the patient has been engaging in physical activities such as water fitness classes and strength training. The patient also reports gastrointestinal issues, which she believes may be due to her gut health. She is considering starting a probiotic to address these issues. The patient is currently on Verzenio and another unspecified medication, which she reports no problems with.     ALLERGIES:  has no known allergies.  MEDICATIONS:  Current Outpatient Medications  Medication Sig Dispense Refill   BREO ELLIPTA 200-25 MCG/ACT AEPB Inhale 1 puff into the lungs daily as needed (shortness of breath).     busPIRone (BUSPAR) 15 MG tablet Take 15 mg by mouth 2 (two) times daily.     letrozole (FEMARA) 2.5 MG tablet Take 1 tablet by mouth daily. 90 tablet 3   montelukast (SINGULAIR) 10 MG tablet Take 10 mg by mouth at bedtime.     sertraline (ZOLOFT) 100 MG tablet Take 200 mg by mouth daily.     VERZENIO 100 MG tablet TAKE 1 TABLET BY MOUTH TWICE  DAILY 56 tablet 3   cholecalciferol (VITAMIN D3) 25 MCG (1000 UNIT) tablet Take 5,000 Units by mouth daily. (Patient not taking: Reported  on 08/13/2023)     ondansetron (ZOFRAN) 4 MG tablet Take 1 tablet (4 mg total) by mouth every 8 (eight) hours as needed for nausea or vomiting. (Patient not taking: Reported on 08/13/2023) 20 tablet 0   No current facility-administered medications for this visit.    PHYSICAL EXAMINATION: ECOG PERFORMANCE STATUS: 1 - Symptomatic but completely  ambulatory  Vitals:   08/13/23 1038  BP: 123/69  Pulse: 77  Resp: 17  Temp: 98.3 F (36.8 C)  SpO2: 97%   Filed Weights   08/13/23 1038  Weight: 256 lb 8 oz (116.3 kg)      LABORATORY DATA:  I have reviewed the data as listed    Latest Ref Rng & Units 08/13/2023   10:21 AM 05/06/2023   10:55 AM 02/11/2023   11:19 AM  CMP  Glucose 70 - 99 mg/dL 161  096  045   BUN 6 - 20 mg/dL 12  11  11    Creatinine 0.44 - 1.00 mg/dL 4.09  8.11  9.14   Sodium 135 - 145 mmol/L 137  139  140   Potassium 3.5 - 5.1 mmol/L 4.4  4.3  4.6   Chloride 98 - 111 mmol/L 106  105  106   CO2 22 - 32 mmol/L 28  28  29    Calcium 8.9 - 10.3 mg/dL 8.7  9.3  9.1   Total Protein 6.5 - 8.1 g/dL 7.4  7.9  7.5   Total Bilirubin 0.0 - 1.2 mg/dL 0.8  0.8  0.8   Alkaline Phos 38 - 126 U/L 46  55  50   AST 15 - 41 U/L 30  18  16    ALT 0 - 44 U/L 35  20  16     Lab Results  Component Value Date   WBC 5.6 08/13/2023   HGB 13.3 08/13/2023   HCT 37.7 08/13/2023   MCV 96.2 08/13/2023   PLT 228 08/13/2023   NEUTROABS 3.5 08/13/2023    ASSESSMENT & PLAN:  CHEK2-related breast cancer [facility]) May 2015: Right breast lump, mammogram ultrasound and MRI showed multiple suspicious nodules within the right axilla.  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, lymph node biopsy: Positive, ER 100%, PR 100%, Ki-67 15%, HER2 equivocal 2+, FISH: 12/26/2021: PET/CT: Small hypermetabolic right chest wall nodules and right axillary lymphadenopathy consistent with recurrent breast cancer, numerous large hepatic metastatic lesions, small scattered pulmonary  nodules, diffuse lytic/destructive osseous metastatic lesions especially at L4   Current treatment: letrozole and Verzinio started 01/03/2022 (Zoladex discontinued since she had BSO on 02/11/2022)   Oophorectomy Right hip replacement surgery L4 kyphoplasty: Does not show any evidence of breast cancer on the biopsies   05/24/2022: CT CAP: Significant partial response to treatment with decrease in size of the hepatic lesions from 5.7 cm to 3.5 cm, sclerotic changes in the previous bone lesions 08/19/2022: CT CAP: Improvement in liver metastasis.  Stable bone metastasis.  No evidence of progression otherwise in chest abdomen pelvis noted. 02/04/2023: CT CAP: Stable liver and bone mets, ST nodule L4  03/10/2023: MRI  L Spine: no mets, stable kyphoplasty at L4, deg disc changes 08/08/2023: CT CAP: Stable liver metastases, stable bone metastases   Adverse effects: Diarrhea: Resolved Redness of  hands and feet: Mild 3.   Fatigue: Markedly better   Based on stable CT findings,we will continue current therapy Bone metastasis: Xgeva scans will be every 6 months.  F/U in 3 months with for injection and labs ------------------------------------- Assessment and Plan Assessment & Plan Metastatic Cancer (Liver and Bone) Stable disease on imaging with slight decrease in size of liver lesions. No new spots identified. Bone lesions stable with treatment-related changes. -Continue current treatment regimen. -Next imaging in 6 months.  Fatigue Patient reports fatigue, possibly related to treatment. Despite fatigue, patient is engaging in regular exercise including water fitness classes and strength training. -Encourage continuation of exercise regimen as tolerated. -Consider probiotic for potential gut health benefits.  Calcium Slightly low at 8.7 (normal 8.9), patient reports running out of supplements. -Resume calcium supplements.  Follow-up -Continue routine injections and blood work every 3  months. -Check labs in 3 months.      No orders of the defined types were placed in this encounter.  The patient has a good understanding of the overall plan. she agrees with it. she will call with any problems that may develop before the next visit here. Total time spent: 30 mins including face to face time and time spent for planning, charting and co-ordination of care   Tamsen Meek, MD 08/13/23

## 2023-08-13 NOTE — Assessment & Plan Note (Signed)
 May 2015: Right breast lump, mammogram ultrasound and MRI showed multiple suspicious nodules within the right axilla.  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, lymph node biopsy: Positive, ER 100%, PR 100%, Ki-67 15%, HER2 equivocal 2+, FISH: 12/26/2021: PET/CT: Small hypermetabolic right chest wall nodules and right axillary lymphadenopathy consistent with recurrent breast cancer, numerous large hepatic metastatic lesions, small scattered pulmonary nodules, diffuse lytic/destructive osseous metastatic lesions especially at L4   Current treatment: letrozole and Verzinio started 01/03/2022 (Zoladex discontinued since she had BSO on 02/11/2022)   Oophorectomy Right hip replacement surgery L4 kyphoplasty: Does not show any evidence of breast cancer on the biopsies   05/24/2022: CT CAP: Significant partial response to treatment with decrease in size of the hepatic lesions from 5.7 cm to 3.5 cm, sclerotic changes in the previous bone lesions 08/19/2022: CT CAP: Improvement in liver metastasis.  Stable bone metastasis.  No evidence of progression otherwise in chest abdomen pelvis noted. 02/04/2023: CT CAP: Stable liver and bone mets, ST nodule L4  03/10/2023: MRI  L Spine: no mets, stable kyphoplasty at L4, deg disc changes 08/08/2023: CT CAP:   Adverse effects: Diarrhea: Resolved Redness of hands and feet: Mild 3.   Fatigue: Markedly better   Based on stable CT findings,we will continue current therapy Bone metastasis: Xgeva scans will be every 6 months.  F/U in 3 months with for injection and labs

## 2023-08-30 ENCOUNTER — Other Ambulatory Visit: Payer: Self-pay | Admitting: Hematology and Oncology

## 2023-10-13 ENCOUNTER — Other Ambulatory Visit: Payer: Self-pay | Admitting: Hematology and Oncology

## 2023-10-14 ENCOUNTER — Encounter: Payer: Self-pay | Admitting: Hematology and Oncology

## 2023-10-26 ENCOUNTER — Other Ambulatory Visit: Payer: Self-pay | Admitting: *Deleted

## 2023-10-26 ENCOUNTER — Encounter: Payer: Self-pay | Admitting: Hematology and Oncology

## 2023-10-26 MED ORDER — BUSPIRONE HCL 15 MG PO TABS: 15.0000 mg | ORAL_TABLET | Freq: Two times a day (BID) | ORAL | 1 refills | Status: AC

## 2023-10-26 MED ORDER — SERTRALINE HCL 100 MG PO TABS: 200.0000 mg | ORAL_TABLET | Freq: Every day | ORAL | 1 refills | Status: AC

## 2023-10-26 MED ORDER — MONTELUKAST SODIUM 10 MG PO TABS: 10.0000 mg | ORAL_TABLET | Freq: Every day | ORAL | 1 refills | Status: AC

## 2023-11-04 ENCOUNTER — Other Ambulatory Visit: Payer: Self-pay | Admitting: Hematology and Oncology

## 2023-11-12 ENCOUNTER — Other Ambulatory Visit

## 2023-11-12 ENCOUNTER — Ambulatory Visit: Admitting: Hematology and Oncology

## 2023-11-12 ENCOUNTER — Ambulatory Visit

## 2023-11-17 ENCOUNTER — Other Ambulatory Visit: Payer: Self-pay | Admitting: *Deleted

## 2023-11-17 DIAGNOSIS — C50911 Malignant neoplasm of unspecified site of right female breast: Secondary | ICD-10-CM

## 2023-11-18 ENCOUNTER — Inpatient Hospital Stay (HOSPITAL_BASED_OUTPATIENT_CLINIC_OR_DEPARTMENT_OTHER): Admitting: Hematology and Oncology

## 2023-11-18 ENCOUNTER — Inpatient Hospital Stay: Attending: Hematology and Oncology

## 2023-11-18 ENCOUNTER — Inpatient Hospital Stay

## 2023-11-18 ENCOUNTER — Encounter: Payer: Self-pay | Admitting: Hematology and Oncology

## 2023-11-18 VITALS — BP 146/89 | HR 85 | Temp 98.2°F | Resp 17 | Wt 244.8 lb

## 2023-11-18 DIAGNOSIS — C50911 Malignant neoplasm of unspecified site of right female breast: Secondary | ICD-10-CM | POA: Insufficient documentation

## 2023-11-18 DIAGNOSIS — Z1589 Genetic susceptibility to other disease: Secondary | ICD-10-CM

## 2023-11-18 DIAGNOSIS — Z170 Estrogen receptor positive status [ER+]: Secondary | ICD-10-CM | POA: Insufficient documentation

## 2023-11-18 DIAGNOSIS — Z1509 Genetic susceptibility to other malignant neoplasm: Secondary | ICD-10-CM

## 2023-11-18 DIAGNOSIS — Z1502 Genetic susceptibility to malignant neoplasm of ovary: Secondary | ICD-10-CM

## 2023-11-18 DIAGNOSIS — C7951 Secondary malignant neoplasm of bone: Secondary | ICD-10-CM | POA: Insufficient documentation

## 2023-11-18 DIAGNOSIS — C50919 Malignant neoplasm of unspecified site of unspecified female breast: Secondary | ICD-10-CM | POA: Diagnosis not present

## 2023-11-18 DIAGNOSIS — Z79811 Long term (current) use of aromatase inhibitors: Secondary | ICD-10-CM | POA: Insufficient documentation

## 2023-11-18 LAB — CBC WITH DIFFERENTIAL (CANCER CENTER ONLY)
Abs Immature Granulocytes: 0.02 10*3/uL (ref 0.00–0.07)
Basophils Absolute: 0.1 10*3/uL (ref 0.0–0.1)
Basophils Relative: 1 %
Eosinophils Absolute: 0.3 10*3/uL (ref 0.0–0.5)
Eosinophils Relative: 5 %
HCT: 39.7 % (ref 36.0–46.0)
Hemoglobin: 14 g/dL (ref 12.0–15.0)
Immature Granulocytes: 0 %
Lymphocytes Relative: 22 %
Lymphs Abs: 1.3 10*3/uL (ref 0.7–4.0)
MCH: 33.5 pg (ref 26.0–34.0)
MCHC: 35.3 g/dL (ref 30.0–36.0)
MCV: 95 fL (ref 80.0–100.0)
Monocytes Absolute: 0.5 10*3/uL (ref 0.1–1.0)
Monocytes Relative: 8 %
Neutro Abs: 3.7 10*3/uL (ref 1.7–7.7)
Neutrophils Relative %: 64 %
Platelet Count: 246 10*3/uL (ref 150–400)
RBC: 4.18 MIL/uL (ref 3.87–5.11)
RDW: 12.2 % (ref 11.5–15.5)
WBC Count: 5.8 10*3/uL (ref 4.0–10.5)
nRBC: 0 % (ref 0.0–0.2)

## 2023-11-18 LAB — CMP (CANCER CENTER ONLY)
ALT: 13 U/L (ref 0–44)
AST: 14 U/L — ABNORMAL LOW (ref 15–41)
Albumin: 4 g/dL (ref 3.5–5.0)
Alkaline Phosphatase: 47 U/L (ref 38–126)
Anion gap: 6 (ref 5–15)
BUN: 13 mg/dL (ref 6–20)
CO2: 29 mmol/L (ref 22–32)
Calcium: 9.7 mg/dL (ref 8.9–10.3)
Chloride: 104 mmol/L (ref 98–111)
Creatinine: 0.95 mg/dL (ref 0.44–1.00)
GFR, Estimated: >60 mL/min (ref >60)
Glucose, Bld: 95 mg/dL (ref 70–99)
Potassium: 4.7 mmol/L (ref 3.5–5.1)
Sodium: 139 mmol/L (ref 135–145)
Total Bilirubin: 0.8 mg/dL (ref 0.0–1.2)
Total Protein: 7.6 g/dL (ref 6.5–8.1)

## 2023-11-18 MED ORDER — DENOSUMAB 120 MG/1.7ML ~~LOC~~ SOLN
120.0000 mg | Freq: Once | SUBCUTANEOUS | Status: AC
  Administered 2023-11-18: 120 mg via SUBCUTANEOUS
  Filled 2023-11-18: qty 1.7

## 2023-11-18 NOTE — Progress Notes (Signed)
 Patient Care Team: Pete Brand, DO as PCP - General (Family Medicine) Cameron Cea, MD as Consulting Physician (Hematology and Oncology) Lockie Rima, MD as Consulting Physician (General Surgery)  DIAGNOSIS:  Encounter Diagnoses  Name Primary?   CHEK2-related breast cancer (HCC) Yes   Recurrent breast cancer, right (HCC)    Cancer, metastatic to bone (HCC)     SUMMARY OF ONCOLOGIC HISTORY: Oncology History  CHEK2-related breast cancer (HCC)  11/25/2013 Initial Diagnosis   CHEK2-related breast cancer [facility])  May 2015 she noted a lump in the right breast at 2 o'clock. MRI Breast (11/21/13): large geographic areas of the right breast malignancy within the 6 to 8 o'clock position right breast lower outer quadrant extending from the chest wall to the retroareolar area of the right breast. Multiple suspicious nodules within the right axilla. 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    04/18/2014 Surgery   Bilateral mastectomies  Right: DCIS but no residual invasive carcinoma. Margins were uninvolved. Sentinel lymph node had a focus of metastatic carcinoma measuring 0.4 cm surgical stage was Tis N1A. Left: benign     Chemotherapy   Received neoadjuvant chemotherapy with Adriamycin and Cytoxan x4.  She then had surgery and then received adjuvant therapy with Taxol weekly x12        - 11/2018 Anti-estrogen oral therapy   Adjuvant antiestrogen therapy with tamoxifen  completed June 2020   12/18/2021 Relapse/Recurrence   Palpable lesion in the right breast.  Mammogram and ultrasound at [facility] revealed recurrent nodules in the reconstructed right breast along with enlarged lymph nodes.  Biopsy of the breast and the lymph nodes was performed that revealed breast cancer that was ER/PR 100% positive HER2 negative.  Breast MRI revealed 3 masses in the right breast measuring 2.4, 2.2, 2.5 cm  multiple enlarged axillary lymph nodes measuring up to 2.4 cm subpectoral lymph node 1.3 cm, liver 2.8 cm, 2.5 cm and 2.8 cm masses     CHIEF COMPLIANT: F/U of Met breast cancer  HISTORY OF PRESENT ILLNESS: History of Present Illness Linda Gutierrez is a 48 year old female who presents with fatigue.  She experiences significant fatigue, which affects her mental health. She feels optimistic upon waking but becomes exhausted within an hour. She takes a medication dose of 100 mg in the morning but occasionally misses her evening dose twice a week. She recently had a scalp infection from eczema, leading to hives, which was treated successfully with antibiotics. Her diarrhea has resolved. No rashes on her hands or feet. No pain or other issues aside from fatigue.     ALLERGIES:  has no known allergies.  MEDICATIONS:  Current Outpatient Medications  Medication Sig Dispense Refill   BREO ELLIPTA  200-25 MCG/ACT AEPB Inhale 1 puff into the lungs daily as needed (shortness of breath).     busPIRone  (BUSPAR ) 15 MG tablet Take 1 tablet (15 mg total) by mouth 2 (two) times daily. 30 tablet 1   cholecalciferol (VITAMIN D3) 25 MCG (1000 UNIT) tablet Take 5,000 Units by mouth daily. (Patient not taking: Reported on 08/13/2023)     letrozole  (FEMARA ) 2.5 MG tablet Take 1 tablet by mouth daily. 90 tablet 3   montelukast  (SINGULAIR ) 10 MG tablet Take 1 tablet (10 mg total) by mouth at bedtime. 30 tablet 1   ondansetron  (ZOFRAN ) 4 MG tablet Take 1 tablet (4 mg total) by mouth every 8 (eight) hours as needed for nausea  or vomiting. (Patient not taking: Reported on 08/13/2023) 20 tablet 0   sertraline  (ZOLOFT ) 100 MG tablet Take 2 tablets (200 mg total) by mouth daily. 60 tablet 1   VERZENIO  100 MG tablet TAKE 1 TABLET BY MOUTH TWICE  DAILY 56 tablet 3   No current facility-administered medications for this visit.    PHYSICAL EXAMINATION: ECOG PERFORMANCE STATUS: 1 - Symptomatic but completely  ambulatory  Vitals:   11/18/23 1407  BP: (!) 146/89  Pulse: 85  Resp: 17  Temp: 98.2 F (36.8 C)  SpO2: 98%   Filed Weights   11/18/23 1407  Weight: 244 lb 12.8 oz (111 kg)      LABORATORY DATA:  I have reviewed the data as listed    Latest Ref Rng & Units 08/13/2023   10:21 AM 05/06/2023   10:55 AM 02/11/2023   11:19 AM  CMP  Glucose 70 - 99 mg/dL 161  096  045   BUN 6 - 20 mg/dL 12  11  11    Creatinine 0.44 - 1.00 mg/dL 4.09  8.11  9.14   Sodium 135 - 145 mmol/L 137  139  140   Potassium 3.5 - 5.1 mmol/L 4.4  4.3  4.6   Chloride 98 - 111 mmol/L 106  105  106   CO2 22 - 32 mmol/L 28  28  29    Calcium 8.9 - 10.3 mg/dL 8.7  9.3  9.1   Total Protein 6.5 - 8.1 g/dL 7.4  7.9  7.5   Total Bilirubin 0.0 - 1.2 mg/dL 0.8  0.8  0.8   Alkaline Phos 38 - 126 U/L 46  55  50   AST 15 - 41 U/L 30  18  16    ALT 0 - 44 U/L 35  20  16     Lab Results  Component Value Date   WBC 5.8 11/18/2023   HGB 14.0 11/18/2023   HCT 39.7 11/18/2023   MCV 95.0 11/18/2023   PLT 246 11/18/2023   NEUTROABS 3.7 11/18/2023    ASSESSMENT & PLAN:  CHEK2-related breast cancer [facility]) May 2015: Right breast lump, mammogram ultrasound and MRI showed multiple suspicious nodules within the right axilla.  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 December 2015: Adjuvant Taxol x12 Adjuvant radiation 2015-2020: Adjuvant tamoxifen  ------------------------------------------------------------- 12/12/2021: Recurrence of breast cancer: Grade 3 IDC, lymph node biopsy: Positive, ER 100%, PR 100%, Ki-67 15%, HER2 equivocal 2+, FISH: 12/26/2021: PET/CT: Small hypermetabolic right chest wall nodules and right axillary lymphadenopathy consistent with recurrent breast cancer, numerous large hepatic metastatic lesions, small scattered  pulmonary nodules, diffuse lytic/destructive osseous metastatic lesions especially at L4   Current treatment: letrozole  and Verzinio started 01/03/2022 (Zoladex  discontinued since she had BSO on 02/11/2022)   Oophorectomy Right hip replacement surgery L4 kyphoplasty: Does not show any evidence of breast cancer on the biopsies   05/24/2022: CT CAP: Significant partial response to treatment with decrease in size of the hepatic lesions from 5.7 cm to 3.5 cm, sclerotic changes in the previous bone lesions 08/19/2022: CT CAP: Improvement in liver metastasis.  Stable bone metastasis.  No evidence of progression otherwise in chest abdomen pelvis noted. 02/04/2023: CT CAP: Stable liver and bone mets, ST nodule L4  03/10/2023: MRI  L Spine: no mets, stable kyphoplasty at L4, deg disc changes 08/08/2023: CT CAP: Stable liver metastases, stable  bone metastases   Adverse effects: Diarrhea: Resolved Redness of hands and feet: Mild 3.   Fatigue: continues to be an issue   Based on stable CT findings,we will continue current therapy Bone metastasis: Xgeva  scans will be every 6 months.  F/U in 3 months with for scans, injection and labs  Assessment & Plan Fatigue due to cancer treatment Severe fatigue persists, attributed to Verzenio . Hemoglobin normal, excluding anemia. Dose reduction considered risky. - Continue Verzenio  100 mg oral bid. - Encourage adherence to medication schedule.  Calcium deficiency Calcium levels pending, previous levels acceptable. - Monitor calcium levels. - Administer Xgeva  injection today.      Orders Placed This Encounter  Procedures   CT CHEST ABDOMEN PELVIS W CONTRAST    Standing Status:   Future    Expected Date:   02/18/2024    Expiration Date:   11/17/2024    If indicated for the ordered procedure, I authorize the administration of contrast media per Radiology protocol:   Yes    Does the patient have a contrast media/X-ray dye allergy?:   No    Is patient  pregnant?:   No    Preferred imaging location?:   [facility]    Release to patient:   Immediate    If indicated for the ordered procedure, I authorize the administration of oral contrast media per Radiology protocol:   Yes   The patient has a good understanding of the overall plan. she agrees with it. she will call with any problems that may develop before the next visit here. Total time spent: 30 mins including face to face time and time spent for planning, charting and co-ordination of care   Viinay K Juan Kissoon, MD 11/18/23

## 2023-11-18 NOTE — Assessment & Plan Note (Signed)
 May 2015: Right breast lump, mammogram ultrasound and MRI showed multiple suspicious nodules within the right axilla.  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 December 2015: Adjuvant Taxol x12 Adjuvant radiation 2015-2020: Adjuvant tamoxifen  ------------------------------------------------------------- 12/12/2021: Recurrence of breast cancer: Grade 3 IDC, lymph node biopsy: Positive, ER 100%, PR 100%, Ki-67 15%, HER2 equivocal 2+, FISH: 12/26/2021: PET/CT: Small hypermetabolic right chest wall nodules and right axillary lymphadenopathy consistent with recurrent breast cancer, numerous large hepatic metastatic lesions, small scattered pulmonary nodules, diffuse lytic/destructive osseous metastatic lesions especially at L4   Current treatment: letrozole  and Verzinio started 01/03/2022 (Zoladex  discontinued since she had BSO on 02/11/2022)   Oophorectomy Right hip replacement surgery L4 kyphoplasty: Does not show any evidence of breast cancer on the biopsies   05/24/2022: CT CAP: Significant partial response to treatment with decrease in size of the hepatic lesions from 5.7 cm to 3.5 cm, sclerotic changes in the previous bone lesions 08/19/2022: CT CAP: Improvement in liver metastasis.  Stable bone metastasis.  No evidence of progression otherwise in chest abdomen pelvis noted. 02/04/2023: CT CAP: Stable liver and bone mets, ST nodule L4  03/10/2023: MRI  L Spine: no mets, stable kyphoplasty at L4, deg disc changes 08/08/2023: CT CAP: Stable liver metastases, stable bone metastases   Adverse effects: Diarrhea: Resolved Redness of hands and feet: Mild 3.   Fatigue: Markedly better   Based on stable CT findings,we will continue current therapy Bone metastasis: Xgeva  scans will be every 6 months.  F/U in 3 months with for  scans, injection and labs

## 2023-11-23 ENCOUNTER — Other Ambulatory Visit: Payer: Self-pay | Admitting: Hematology and Oncology

## 2023-11-26 ENCOUNTER — Encounter: Payer: Self-pay | Admitting: Hematology and Oncology

## 2023-11-26 ENCOUNTER — Other Ambulatory Visit (HOSPITAL_COMMUNITY): Payer: Self-pay

## 2023-11-26 ENCOUNTER — Other Ambulatory Visit: Payer: Self-pay | Admitting: Pharmacist

## 2023-11-26 MED ORDER — ABEMACICLIB 100 MG PO TABS
100.0000 mg | ORAL_TABLET | Freq: Two times a day (BID) | ORAL | 3 refills | Status: AC
Start: 2023-11-26 — End: ?

## 2023-11-27 ENCOUNTER — Telehealth: Payer: Self-pay

## 2023-11-27 NOTE — Telephone Encounter (Signed)
 Received notification that patient's insurance changed to [employer]. Verzenio  must now be filled through [facility]. Script has been sent to [facility] for processing and fulfillment.    Hansel Ley, CPhT Supervisor Pharmacy Patient Advocate [facility] [phone] (Ph) 11/27/2023 8:30 AM

## 2023-12-18 ENCOUNTER — Telehealth: Payer: Self-pay

## 2023-12-18 ENCOUNTER — Other Ambulatory Visit (HOSPITAL_COMMUNITY): Payer: Self-pay

## 2023-12-18 NOTE — Telephone Encounter (Signed)
 Oral Oncology Patient Advocate Encounter   Received notification that prior authorization for Verzenio  is required.   PA submitted on 12/18/23 Key [id number] Status is pending      Charlott Hamilton,  CPhT-Adv  she/her/hers [facility]  [facility] Pharmacy Technician Patient Advocate [facility] Phone: [phone]  Fax: [phone] [email or web address]

## 2023-12-18 NOTE — Telephone Encounter (Signed)
 Oral Oncology Patient Advocate Encounter  Prior Authorization for Verzenio  has been approved.    PA# [id number] Effective dates: 12/18/23 through 12/17/24  Patient must fill at [facility].    Charlott Hamilton,  CPhT-Adv  she/her/hers [facility]  [facility] Pharmacy Technician Patient Advocate [facility] Phone: [phone]  Fax: [phone] [email or web address]

## 2024-01-26 ENCOUNTER — Other Ambulatory Visit: Payer: Self-pay | Admitting: *Deleted

## 2024-01-26 ENCOUNTER — Encounter: Payer: Self-pay | Admitting: Hematology and Oncology

## 2024-01-26 MED ORDER — PROCHLORPERAZINE MALEATE 10 MG PO TABS: 10.0000 mg | ORAL_TABLET | Freq: Four times a day (QID) | ORAL | 0 refills | Status: AC | PRN

## 2024-01-26 NOTE — Telephone Encounter (Signed)
 Received message from pt requesting refill on Compazine  10 mg tablet PRN for nausea.  Verbal orders received and placed per MD request.

## 2024-01-28 ENCOUNTER — Other Ambulatory Visit: Payer: Self-pay | Admitting: Hematology and Oncology

## 2024-01-29 NOTE — Telephone Encounter (Signed)
 Saw Dr. Gudena 11/2023

## 2024-02-10 ENCOUNTER — Encounter: Payer: Self-pay | Admitting: Hematology and Oncology

## 2024-02-16 ENCOUNTER — Other Ambulatory Visit: Payer: Self-pay | Admitting: *Deleted

## 2024-02-16 DIAGNOSIS — C50911 Malignant neoplasm of unspecified site of right female breast: Secondary | ICD-10-CM

## 2024-02-17 ENCOUNTER — Ambulatory Visit (HOSPITAL_COMMUNITY)
Admission: RE | Admit: 2024-02-17 | Discharge: 2024-02-17 | Disposition: A | Discharge status: Home / Self Care | Source: Ambulatory Visit | Attending: Hematology and Oncology | Admitting: Hematology and Oncology

## 2024-02-17 ENCOUNTER — Inpatient Hospital Stay: Attending: Hematology and Oncology

## 2024-02-17 DIAGNOSIS — C7951 Secondary malignant neoplasm of bone: Secondary | ICD-10-CM | POA: Insufficient documentation

## 2024-02-17 DIAGNOSIS — C50911 Malignant neoplasm of unspecified site of right female breast: Secondary | ICD-10-CM | POA: Diagnosis present

## 2024-02-17 DIAGNOSIS — C50811 Malignant neoplasm of overlapping sites of right female breast: Secondary | ICD-10-CM | POA: Diagnosis not present

## 2024-02-17 DIAGNOSIS — Z170 Estrogen receptor positive status [ER+]: Secondary | ICD-10-CM | POA: Insufficient documentation

## 2024-02-17 LAB — CMP (CANCER CENTER ONLY)
ALT: 13 U/L (ref 0–44)
AST: 15 U/L (ref 15–41)
Albumin: 4.3 g/dL (ref 3.5–5.0)
Alkaline Phosphatase: 55 U/L (ref 38–126)
Anion gap: 6 (ref 5–15)
BUN: 9 mg/dL (ref 6–20)
CO2: 27 mmol/L (ref 22–32)
Calcium: 9.5 mg/dL (ref 8.9–10.3)
Chloride: 106 mmol/L (ref 98–111)
Creatinine: 0.86 mg/dL (ref 0.44–1.00)
GFR, Estimated: >60 mL/min (ref >60)
Glucose, Bld: 91 mg/dL (ref 70–99)
Potassium: 4.8 mmol/L (ref 3.5–5.1)
Sodium: 139 mmol/L (ref 135–145)
Total Bilirubin: 1 mg/dL (ref 0.0–1.2)
Total Protein: 8.2 g/dL — ABNORMAL HIGH (ref 6.5–8.1)

## 2024-02-17 LAB — CBC WITH DIFFERENTIAL (CANCER CENTER ONLY)
Abs Immature Granulocytes: 0.02 K/uL (ref 0.00–0.07)
Basophils Absolute: 0.1 K/uL (ref 0.0–0.1)
Basophils Relative: 2 %
Eosinophils Absolute: 0.2 K/uL (ref 0.0–0.5)
Eosinophils Relative: 3 %
HCT: 40.6 % (ref 36.0–46.0)
Hemoglobin: 14.3 g/dL (ref 12.0–15.0)
Immature Granulocytes: 0 %
Lymphocytes Relative: 25 %
Lymphs Abs: 1.2 K/uL (ref 0.7–4.0)
MCH: 33.9 pg (ref 26.0–34.0)
MCHC: 35.2 g/dL (ref 30.0–36.0)
MCV: 96.2 fL (ref 80.0–100.0)
Monocytes Absolute: 0.4 K/uL (ref 0.1–1.0)
Monocytes Relative: 9 %
Neutro Abs: 3 K/uL (ref 1.7–7.7)
Neutrophils Relative %: 61 %
Platelet Count: 234 K/uL (ref 150–400)
RBC: 4.22 MIL/uL (ref 3.87–5.11)
RDW: 13.2 % (ref 11.5–15.5)
WBC Count: 4.9 K/uL (ref 4.0–10.5)
nRBC: 0 % (ref 0.0–0.2)

## 2024-02-17 MED ORDER — IOHEXOL 300 MG/ML  SOLN
100.0000 mL | Freq: Once | INTRAMUSCULAR | Status: AC | PRN
Start: 2024-02-17 — End: 2024-02-17
  Administered 2024-02-17: 100 mL via INTRAVENOUS

## 2024-02-23 ENCOUNTER — Inpatient Hospital Stay

## 2024-02-23 ENCOUNTER — Inpatient Hospital Stay: Admitting: Hematology and Oncology

## 2024-02-23 VITALS — BP 121/85 | HR 88 | Temp 97.6°F | Resp 16 | Wt 235.5 lb

## 2024-02-23 DIAGNOSIS — C50911 Malignant neoplasm of unspecified site of right female breast: Secondary | ICD-10-CM

## 2024-02-23 DIAGNOSIS — Z1502 Genetic susceptibility to malignant neoplasm of ovary: Secondary | ICD-10-CM

## 2024-02-23 DIAGNOSIS — C7951 Secondary malignant neoplasm of bone: Secondary | ICD-10-CM | POA: Diagnosis not present

## 2024-02-23 DIAGNOSIS — Z1509 Genetic susceptibility to other malignant neoplasm: Secondary | ICD-10-CM

## 2024-02-23 DIAGNOSIS — C50919 Malignant neoplasm of unspecified site of unspecified female breast: Secondary | ICD-10-CM | POA: Diagnosis not present

## 2024-02-23 DIAGNOSIS — Z1589 Genetic susceptibility to other disease: Secondary | ICD-10-CM | POA: Diagnosis not present

## 2024-02-23 MED ORDER — DENOSUMAB 120 MG/1.7ML ~~LOC~~ SOLN
120.0000 mg | Freq: Once | SUBCUTANEOUS | Status: AC
  Administered 2024-02-23: 120 mg via SUBCUTANEOUS
  Filled 2024-02-23: qty 1.7

## 2024-02-23 NOTE — Progress Notes (Signed)
 Patient Care Team: Gerome Brunet, DO as PCP - General (Family Medicine) Odean Potts, MD as Consulting Physician (Hematology and Oncology) Aron Shoulders, MD as Consulting Physician (General Surgery)  DIAGNOSIS:  Encounter Diagnosis  Name Primary?   CHEK2-related breast cancer (HCC) Yes    SUMMARY OF ONCOLOGIC HISTORY: Oncology History  CHEK2-related breast cancer (HCC)  11/25/2013 Initial Diagnosis   CHEK2-related breast cancer [facility])  May 2015 she noted a lump in the right breast at 2 o'clock. MRI Breast (11/21/13): large geographic areas of the right breast malignancy within the 6 to 8 o'clock position right breast lower outer quadrant extending from the chest wall to the retroareolar area of the right breast. Multiple suspicious nodules within the right axilla. 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    04/18/2014 Surgery   Bilateral mastectomies  Right: DCIS but no residual invasive carcinoma. Margins were uninvolved. Sentinel lymph node had a focus of metastatic carcinoma measuring 0.4 cm surgical stage was Tis N1A. Left: benign     Chemotherapy   Received neoadjuvant chemotherapy with Adriamycin and Cytoxan x4.  She then had surgery and then received adjuvant therapy with Taxol weekly x12        - 11/2018 Anti-estrogen oral therapy   Adjuvant antiestrogen therapy with tamoxifen  completed June 2020   12/18/2021 Relapse/Recurrence   Palpable lesion in the right breast.  Mammogram and ultrasound at [facility] revealed recurrent nodules in the reconstructed right breast along with enlarged lymph nodes.  Biopsy of the breast and the lymph nodes was performed that revealed breast cancer that was ER/PR 100% positive HER2 negative.  Breast MRI revealed 3 masses in the right breast measuring 2.4, 2.2, 2.5 cm multiple enlarged axillary lymph nodes measuring up to 2.4 cm subpectoral lymph  node 1.3 cm, liver 2.8 cm, 2.5 cm and 2.8 cm masses     CHIEF COMPLIANT: Follow-up to discuss results of recent CT scan  HISTORY OF PRESENT ILLNESS: History of Present Illness Linda Gutierrez is a 48 year old female with metastatic breast cancer who presents for routine follow-up.  She has undergone vertebral cement augmentation at L4 and has a small spot in the right ileum. Her current treatment regimen has been ongoing for several years and is well-tolerated, with some diarrhea managed effectively with minimal Imodium use. Bowel movements have decreased in frequency. She experiences difficulty staying asleep, often waking between 3 and 5 AM, and takes magnesium  glycinate to aid sleep. Daytime naps are frequent due to insufficient nighttime sleep.     ALLERGIES:  has no known allergies.  MEDICATIONS:  Current Outpatient Medications  Medication Sig Dispense Refill   abemaciclib  (VERZENIO ) 100 MG tablet Take 1 tablet (100 mg total) by mouth 2 (two) times daily. 56 tablet 3   BREO ELLIPTA  200-25 MCG/ACT AEPB Inhale 1 puff into the lungs daily as needed (shortness of breath).     busPIRone  (BUSPAR ) 15 MG tablet Take 1 tablet (15 mg total) by mouth 2 (two) times daily. 30 tablet 1   cholecalciferol (VITAMIN D3) 25 MCG (1000 UNIT) tablet Take 5,000 Units by mouth daily. (Patient not taking: Reported on 08/13/2023)     letrozole  (FEMARA ) 2.5 MG tablet Take 1 tablet by mouth daily. 90 tablet 2   montelukast  (SINGULAIR ) 10 MG tablet Take 1 tablet (10 mg total) by mouth at bedtime. 30 tablet 1   ondansetron  (ZOFRAN ) 4 MG tablet Take 1 tablet (4 mg  total) by mouth every 8 (eight) hours as needed for nausea or vomiting. (Patient not taking: Reported on 08/13/2023) 20 tablet 0   prochlorperazine  (COMPAZINE ) 10 MG tablet Take 1 tablet (10 mg total) by mouth every 6 (six) hours as needed for nausea or vomiting. 30 tablet 0   sertraline  (ZOLOFT ) 100 MG tablet Take 2 tablets (200 mg total) by mouth daily. 60  tablet 1   No current facility-administered medications for this visit.    PHYSICAL EXAMINATION: ECOG PERFORMANCE STATUS: 1 - Symptomatic but completely ambulatory  Vitals:   02/23/24 1413  BP: 121/85  Pulse: 88  Resp: 16  Temp: 97.6 F (36.4 C)  SpO2: 98%   Filed Weights   02/23/24 1413  Weight: 235 lb 8 oz (106.8 kg)     LABORATORY DATA:  I have reviewed the data as listed    Latest Ref Rng & Units 02/17/2024    1:05 PM 11/18/2023    1:46 PM 08/13/2023   10:21 AM  CMP  Glucose 70 - 99 mg/dL 91  95  889   BUN 6 - 20 mg/dL 9  13  12    Creatinine 0.44 - 1.00 mg/dL 9.13  9.04  9.16   Sodium 135 - 145 mmol/L 139  139  137   Potassium 3.5 - 5.1 mmol/L 4.8  4.7  4.4   Chloride 98 - 111 mmol/L 106  104  106   CO2 22 - 32 mmol/L 27  29  28    Calcium 8.9 - 10.3 mg/dL 9.5  9.7  8.7   Total Protein 6.5 - 8.1 g/dL 8.2  7.6  7.4   Total Bilirubin 0.0 - 1.2 mg/dL 1.0  0.8  0.8   Alkaline Phos 38 - 126 U/L 55  47  46   AST 15 - 41 U/L 15  14  30    ALT 0 - 44 U/L 13  13  35     Lab Results  Component Value Date   WBC 4.9 02/17/2024   HGB 14.3 02/17/2024   HCT 40.6 02/17/2024   MCV 96.2 02/17/2024   PLT 234 02/17/2024   NEUTROABS 3.0 02/17/2024    ASSESSMENT & PLAN:  CHEK2-related breast cancer [facility]) May 2015: Right breast lump, mammogram ultrasound and MRI showed multiple suspicious nodules within the right axilla.  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 December 2015: Adjuvant Taxol x12 Adjuvant radiation 2015-2020: Adjuvant tamoxifen  ------------------------------------------------------------- 12/12/2021: Recurrence of breast cancer: Grade 3 IDC, lymph node biopsy: Positive, ER 100%, PR 100%, Ki-67 15%, HER2 equivocal 2+, FISH: 12/26/2021: PET/CT: Small hypermetabolic right chest wall  nodules and right axillary lymphadenopathy consistent with recurrent breast cancer, numerous large hepatic metastatic lesions, small scattered pulmonary nodules, diffuse lytic/destructive osseous metastatic lesions especially at L4   Current treatment: letrozole  and Verzinio started 01/03/2022 (Zoladex  discontinued since she had BSO on 02/11/2022)   Oophorectomy Right hip replacement surgery L4 kyphoplasty: Does not show any evidence of breast cancer on the biopsies   05/24/2022: CT CAP: Significant partial response to treatment with decrease in size of the hepatic lesions from 5.7 cm to 3.5 cm, sclerotic changes in the previous bone lesions 08/19/2022: CT CAP: Improvement in liver metastasis.  Stable bone metastasis.  No evidence of progression otherwise in chest abdomen pelvis noted. 02/04/2023: CT CAP: Stable liver and bone mets, ST nodule L4  03/10/2023: MRI  L Spine: no mets, stable kyphoplasty at L4, deg disc changes 08/08/2023: CT CAP: Stable liver metastases, stable bone metastases 02/17/2024: CT CAP: Stable liver metastases, stable bone metastases L4 vertebral cement augmentation, small sclerotic lesion right ilium   Adverse effects: Diarrhea: Resolved Redness of hands and feet: Mild 3.   Fatigue: continues to be an issue 4.  Difficulty staying asleep: We did briefly discussed different techniques to stay asleep.   Based on stable CT findings,we will continue current therapy Bone metastasis: Xgeva  scans will be every 6 months.  F/U in 3 months with for injection and labs and MD visit  Assessment & Plan Metastatic breast cancer with bone and liver involvement Well-managed with stable liver and bone metastases. Current treatment effective and well-tolerated. - Continue current treatment regimen indefinitely. - Order body scan every six months. - Administer bone treatment every three months.  Fatigue due to cancer treatment Fatigue likely related to treatment. Symptoms  managed.  Insomnia Intermittent insomnia with difficulty staying asleep, possibly due to daytime napping. Non-pharmacological interventions in use. - Encourage avoiding daytime naps to improve nighttime sleep. - Advise spending time outdoors and engaging in activities to reduce daytime sleepiness.      No orders of the defined types were placed in this encounter.  The patient has a good understanding of the overall plan. she agrees with it. she will call with any problems that may develop before the next visit here. Total time spent: 30 mins including face to face time and time spent for planning, charting and co-ordination of care   Naomi MARLA Chad, MD 02/23/24

## 2024-02-23 NOTE — Assessment & Plan Note (Signed)
 May 2015: Right breast lump, mammogram ultrasound and MRI showed multiple suspicious nodules within the right axilla.  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 December 2015: Adjuvant Taxol x12 Adjuvant radiation 2015-2020: Adjuvant tamoxifen  ------------------------------------------------------------- 12/12/2021: Recurrence of breast cancer: Grade 3 IDC, lymph node biopsy: Positive, ER 100%, PR 100%, Ki-67 15%, HER2 equivocal 2+, FISH: 12/26/2021: PET/CT: Small hypermetabolic right chest wall nodules and right axillary lymphadenopathy consistent with recurrent breast cancer, numerous large hepatic metastatic lesions, small scattered pulmonary nodules, diffuse lytic/destructive osseous metastatic lesions especially at L4   Current treatment: letrozole  and Verzinio started 01/03/2022 (Zoladex  discontinued since she had BSO on 02/11/2022)   Oophorectomy Right hip replacement surgery L4 kyphoplasty: Does not show any evidence of breast cancer on the biopsies   05/24/2022: CT CAP: Significant partial response to treatment with decrease in size of the hepatic lesions from 5.7 cm to 3.5 cm, sclerotic changes in the previous bone lesions 08/19/2022: CT CAP: Improvement in liver metastasis.  Stable bone metastasis.  No evidence of progression otherwise in chest abdomen pelvis noted. 02/04/2023: CT CAP: Stable liver and bone mets, ST nodule L4  03/10/2023: MRI  L Spine: no mets, stable kyphoplasty at L4, deg disc changes 08/08/2023: CT CAP: Stable liver metastases, stable bone metastases 02/17/2024: CT CAP:   Adverse effects: Diarrhea: Resolved Redness of hands and feet: Mild 3.   Fatigue: continues to be an issue   Based on stable CT findings,we will continue current therapy Bone metastasis: Xgeva  scans will be every 6  months.  F/U in 3 months with for injection and labs and MD visit

## 2024-05-24 ENCOUNTER — Inpatient Hospital Stay: Admitting: Hematology and Oncology

## 2024-05-24 ENCOUNTER — Inpatient Hospital Stay

## 2024-05-24 ENCOUNTER — Inpatient Hospital Stay: Attending: Hematology and Oncology

## 2024-05-24 VITALS — BP 135/83 | HR 84 | Temp 97.8°F | Resp 18 | Wt 234.7 lb

## 2024-05-24 DIAGNOSIS — C773 Secondary and unspecified malignant neoplasm of axilla and upper limb lymph nodes: Secondary | ICD-10-CM | POA: Diagnosis not present

## 2024-05-24 DIAGNOSIS — Z1589 Genetic susceptibility to other disease: Secondary | ICD-10-CM | POA: Diagnosis not present

## 2024-05-24 DIAGNOSIS — C787 Secondary malignant neoplasm of liver and intrahepatic bile duct: Secondary | ICD-10-CM | POA: Insufficient documentation

## 2024-05-24 DIAGNOSIS — Z1501 Genetic susceptibility to malignant neoplasm of breast: Secondary | ICD-10-CM | POA: Diagnosis not present

## 2024-05-24 DIAGNOSIS — Z170 Estrogen receptor positive status [ER+]: Secondary | ICD-10-CM | POA: Insufficient documentation

## 2024-05-24 DIAGNOSIS — C50511 Malignant neoplasm of lower-outer quadrant of right female breast: Secondary | ICD-10-CM | POA: Insufficient documentation

## 2024-05-24 DIAGNOSIS — C50919 Malignant neoplasm of unspecified site of unspecified female breast: Secondary | ICD-10-CM

## 2024-05-24 DIAGNOSIS — C7951 Secondary malignant neoplasm of bone: Secondary | ICD-10-CM | POA: Insufficient documentation

## 2024-05-24 DIAGNOSIS — C50011 Malignant neoplasm of nipple and areola, right female breast: Secondary | ICD-10-CM | POA: Diagnosis present

## 2024-05-24 DIAGNOSIS — C50911 Malignant neoplasm of unspecified site of right female breast: Secondary | ICD-10-CM

## 2024-05-24 LAB — CBC WITH DIFFERENTIAL (CANCER CENTER ONLY)
Abs Immature Granulocytes: 0.01 K/uL (ref 0.00–0.07)
Basophils Absolute: 0.1 K/uL (ref 0.0–0.1)
Basophils Relative: 1 %
Eosinophils Absolute: 0.1 K/uL (ref 0.0–0.5)
Eosinophils Relative: 2 %
HCT: 39.5 % (ref 36.0–46.0)
Hemoglobin: 14.4 g/dL (ref 12.0–15.0)
Immature Granulocytes: 0 %
Lymphocytes Relative: 24 %
Lymphs Abs: 1.3 K/uL (ref 0.7–4.0)
MCH: 35.2 pg — ABNORMAL HIGH (ref 26.0–34.0)
MCHC: 36.5 g/dL — ABNORMAL HIGH (ref 30.0–36.0)
MCV: 96.6 fL (ref 80.0–100.0)
Monocytes Absolute: 0.4 K/uL (ref 0.1–1.0)
Monocytes Relative: 6 %
Neutro Abs: 3.6 K/uL (ref 1.7–7.7)
Neutrophils Relative %: 67 %
Platelet Count: 225 K/uL (ref 150–400)
RBC: 4.09 MIL/uL (ref 3.87–5.11)
RDW: 12.4 % (ref 11.5–15.5)
WBC Count: 5.4 K/uL (ref 4.0–10.5)
nRBC: 0 % (ref 0.0–0.2)

## 2024-05-24 LAB — CMP (CANCER CENTER ONLY)
ALT: 17 U/L (ref 0–44)
AST: 21 U/L (ref 15–41)
Albumin: 4.2 g/dL (ref 3.5–5.0)
Alkaline Phosphatase: 58 U/L (ref 38–126)
Anion gap: 11 (ref 5–15)
BUN: 7 mg/dL (ref 6–20)
CO2: 25 mmol/L (ref 22–32)
Calcium: 9.7 mg/dL (ref 8.9–10.3)
Chloride: 102 mmol/L (ref 98–111)
Creatinine: 0.9 mg/dL (ref 0.44–1.00)
GFR, Estimated: >60 mL/min (ref >60)
Glucose, Bld: 132 mg/dL — ABNORMAL HIGH (ref 70–99)
Potassium: 4.1 mmol/L (ref 3.5–5.1)
Sodium: 138 mmol/L (ref 135–145)
Total Bilirubin: 0.9 mg/dL (ref 0.0–1.2)
Total Protein: 8.1 g/dL (ref 6.5–8.1)

## 2024-05-24 MED ORDER — ONDANSETRON HCL 4 MG PO TABS: 4.0000 mg | ORAL_TABLET | Freq: Three times a day (TID) | ORAL | 3 refills | Status: AC | PRN

## 2024-05-24 MED ORDER — DENOSUMAB 120 MG/1.7ML ~~LOC~~ SOLN
120.0000 mg | Freq: Once | SUBCUTANEOUS | Status: AC
  Administered 2024-05-24: 15:00:00 120 mg via SUBCUTANEOUS
  Filled 2024-05-24: qty 1.7

## 2024-05-24 MED ORDER — BREO ELLIPTA 200-25 MCG/ACT IN AEPB: 1.0000 | INHALATION_SPRAY | Freq: Every day | RESPIRATORY_TRACT | 3 refills | Status: AC | PRN

## 2024-05-24 NOTE — Progress Notes (Signed)
 Patient Care Team: Gerome Brunet, DO as PCP - General (Family Medicine) Odean Potts, MD as Consulting Physician (Hematology and Oncology) Aron Shoulders, MD as Consulting Physician (General Surgery)  DIAGNOSIS:  Encounter Diagnosis  Name Primary?   CHEK2-related breast cancer (HCC) Yes    SUMMARY OF ONCOLOGIC HISTORY: Oncology History  CHEK2-related breast cancer (HCC)  11/25/2013 Initial Diagnosis   CHEK2-related breast cancer [facility])  May 2015 Linda Gutierrez noted a lump in the right breast at 2 o'clock. MRI Breast (11/21/13): large geographic areas of the right breast malignancy within the 6 to 8 o'clock position right breast lower outer quadrant extending from the chest wall to the retroareolar area of the right breast. Multiple suspicious nodules within the right axilla. 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    04/18/2014 Surgery   Bilateral mastectomies  Right: DCIS but no residual invasive carcinoma. Margins were uninvolved. Sentinel lymph node had a focus of metastatic carcinoma measuring 0.4 cm surgical stage was Tis N1A. Left: benign     Chemotherapy   Received neoadjuvant chemotherapy with Adriamycin and Cytoxan x4.  Linda Gutierrez then had surgery and then received adjuvant therapy with Taxol weekly x12        - 11/2018 Anti-estrogen oral therapy   Adjuvant antiestrogen therapy with tamoxifen  completed June 2020   12/18/2021 Relapse/Recurrence   Palpable lesion in the right breast.  Mammogram and ultrasound at [facility] revealed recurrent nodules in the reconstructed right breast along with enlarged lymph nodes.  Biopsy of the breast and the lymph nodes was performed that revealed breast cancer that was ER/PR 100% positive HER2 negative.  Breast MRI revealed 3 masses in the right breast measuring 2.4, 2.2, 2.5 cm multiple enlarged axillary lymph nodes measuring up to 2.4 cm subpectoral lymph  node 1.3 cm, liver 2.8 cm, 2.5 cm and 2.8 cm masses     CHIEF COMPLIANT: Follow-up on Verzinio and letrozole   HISTORY OF PRESENT ILLNESS:  History of Present Illness Linda Gutierrez is a 48 year old female with metastatic CHEK2-related breast cancer who presents for routine oncology follow-up and management of treatment-related side effects.  Linda Gutierrez is on letrozole  and abemaciclib  for metastatic disease with bone-directed therapy every three months. Linda Gutierrez previously completed neoadjuvant and adjuvant chemotherapy, bilateral mastectomies, radiation, and endocrine therapy, with recurrence in July 2023 and partial response by December 2023.  Her most significant treatment-related side effect is persistent, dose-dependent fatigue.  Linda Gutierrez has intermittent, manageable diarrhea that is not daily and does not require antidiarrheal medication. Bowel movements are otherwise regular. Linda Gutierrez denies changes in taste or appetite, significant alopecia, abnormal bleeding, bruising, or excessive pain.  Linda Gutierrez has no issues obtaining medications and adheres to her regimen. Linda Gutierrez requested refills for Breo inhaler and Zofran , noting occasional inadequate nausea control. Zoloft  is managed by her primary care provider.      ALLERGIES:  has no known allergies.  MEDICATIONS:  Current Outpatient Medications  Medication Sig Dispense Refill   abemaciclib  (VERZENIO ) 100 MG tablet Take 1 tablet (100 mg total) by mouth 2 (two) times daily. 56 tablet 3   BREO ELLIPTA  200-25 MCG/ACT AEPB Inhale 1 puff into the lungs daily as needed (shortness of breath).     busPIRone  (BUSPAR ) 15 MG tablet Take 1 tablet (15 mg total) by mouth 2 (two) times daily. 30 tablet 1   cholecalciferol (VITAMIN D3) 25 MCG (1000 UNIT) tablet Take 5,000 Units by mouth daily. (Patient not taking: Reported on  08/13/2023)     letrozole  (FEMARA ) 2.5 MG tablet Take 1 tablet by mouth daily. 90 tablet 2   montelukast  (SINGULAIR ) 10 MG tablet Take 1 tablet (10 mg  total) by mouth at bedtime. 30 tablet 1   ondansetron  (ZOFRAN ) 4 MG tablet Take 1 tablet (4 mg total) by mouth every 8 (eight) hours as needed for nausea or vomiting. (Patient not taking: Reported on 08/13/2023) 20 tablet 0   prochlorperazine  (COMPAZINE ) 10 MG tablet Take 1 tablet (10 mg total) by mouth every 6 (six) hours as needed for nausea or vomiting. 30 tablet 0   sertraline  (ZOLOFT ) 100 MG tablet Take 2 tablets (200 mg total) by mouth daily. 60 tablet 1   No current facility-administered medications for this visit.    PHYSICAL EXAMINATION: ECOG PERFORMANCE STATUS: 1 - Symptomatic but completely ambulatory  Vitals:   05/24/24 1403  BP: 135/83  Pulse: 84  Resp: 18  Temp: 97.8 F (36.6 C)  SpO2: 99%   Filed Weights   05/24/24 1403  Weight: 234 lb 11.2 oz (106.5 kg)      LABORATORY DATA:  I have reviewed the data as listed    Latest Ref Rng & Units 02/17/2024    1:05 PM 11/18/2023    1:46 PM 08/13/2023   10:21 AM  CMP  Glucose 70 - 99 mg/dL 91  95  889   BUN 6 - 20 mg/dL 9  13  12    Creatinine 0.44 - 1.00 mg/dL 9.13  9.04  9.16   Sodium 135 - 145 mmol/L 139  139  137   Potassium 3.5 - 5.1 mmol/L 4.8  4.7  4.4   Chloride 98 - 111 mmol/L 106  104  106   CO2 22 - 32 mmol/L 27  29  28    Calcium 8.9 - 10.3 mg/dL 9.5  9.7  8.7   Total Protein 6.5 - 8.1 g/dL 8.2  7.6  7.4   Total Bilirubin 0.0 - 1.2 mg/dL 1.0  0.8  0.8   Alkaline Phos 38 - 126 U/L 55  47  46   AST 15 - 41 U/L 15  14  30    ALT 0 - 44 U/L 13  13  35     Lab Results  Component Value Date   WBC 5.4 05/24/2024   HGB 14.4 05/24/2024   HCT 39.5 05/24/2024   MCV 96.6 05/24/2024   PLT 225 05/24/2024   NEUTROABS 3.6 05/24/2024    ASSESSMENT & PLAN:  CHEK2-related breast cancer [facility])   May 2015: Right breast lump, mammogram ultrasound and MRI showed multiple suspicious nodules within the right axilla.  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, Ki-67 12% July 2015: Neoadjuvant  chemotherapy with Adriamycin and Cytoxan x4 04/18/2014: Bilateral mastectomies: Right breast: DCIS no residual invasive cancer December 2015: Adjuvant Taxol x12 Adjuvant radiation 2015-2020: Adjuvant tamoxifen  ------------------------------------------------------------- 12/12/2021: Recurrence of breast cancer: Grade 3 IDC, lymph node biopsy: Positive, ER 100%, PR 100%, Ki-67 15%, HER2 equivocal 2+, FISH: 12/26/2021: PET/CT: Small hypermetabolic right chest wall nodules and right axillary lymphadenopathy consistent with recurrent breast cancer, numerous large hepatic metastatic lesions, small scattered pulmonary nodules, diffuse lytic/destructive osseous metastatic lesions especially at L4   Current treatment: letrozole  and Verzinio started 01/03/2022 (Zoladex  discontinued since Linda Gutierrez had BSO on 02/11/2022)   Oophorectomy Right hip replacement surgery L4 kyphoplasty: Does not show any evidence of breast cancer on the biopsies   05/24/2022: CT CAP: Significant partial response to treatment  with decrease in size of the hepatic lesions from 5.7 cm to 3.5 cm, sclerotic changes in the previous bone lesions 08/19/2022: CT CAP: Improvement in liver metastasis.  Stable bone metastasis.  No evidence of progression otherwise in chest abdomen pelvis noted. 02/04/2023: CT CAP: Stable liver and bone mets, ST nodule L4  03/10/2023: MRI  L Spine: no mets, stable kyphoplasty at L4, deg disc changes 08/08/2023: CT CAP: Stable liver metastases, stable bone metastases 02/17/2024: CT CAP: Stable liver metastases, stable bone metastases L4 vertebral cement augmentation, small sclerotic lesion right ilium   Adverse effects: Diarrhea: Persistent but mild Redness of hands and feet: Mild 3.   Fatigue: continues to be an issue      Bone metastasis: Xgeva  scans will be in 3 months with for injection and labs and MD visit     No orders of the defined types were placed in this encounter.  The patient has a good  understanding of the overall plan. Linda Gutierrez agrees with it. Linda Gutierrez will call with any problems that may develop before the next visit here.  I personally spent a total of 30 minutes in the care of the patient today including preparing to see the patient, getting/reviewing separately obtained history, performing a medically appropriate exam/evaluation, counseling and educating, placing orders, referring and communicating with other health care professionals, documenting clinical information in the EHR, independently interpreting results, communicating results, and coordinating care.   Viinay K Callie Bunyard, MD 05/24/2024

## 2024-05-24 NOTE — Assessment & Plan Note (Signed)
° °  May 2015: Right breast lump, mammogram ultrasound and MRI showed multiple suspicious nodules within the right axilla.  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 December 2015: Adjuvant Taxol x12 Adjuvant radiation 2015-2020: Adjuvant tamoxifen  ------------------------------------------------------------- 12/12/2021: Recurrence of breast cancer: Grade 3 IDC, lymph node biopsy: Positive, ER 100%, PR 100%, Ki-67 15%, HER2 equivocal 2+, FISH: 12/26/2021: PET/CT: Small hypermetabolic right chest wall nodules and right axillary lymphadenopathy consistent with recurrent breast cancer, numerous large hepatic metastatic lesions, small scattered pulmonary nodules, diffuse lytic/destructive osseous metastatic lesions especially at L4   Current treatment: letrozole  and Verzinio started 01/03/2022 (Zoladex  discontinued since she had BSO on 02/11/2022)   Oophorectomy Right hip replacement surgery L4 kyphoplasty: Does not show any evidence of breast cancer on the biopsies   05/24/2022: CT CAP: Significant partial response to treatment with decrease in size of the hepatic lesions from 5.7 cm to 3.5 cm, sclerotic changes in the previous bone lesions 08/19/2022: CT CAP: Improvement in liver metastasis.  Stable bone metastasis.  No evidence of progression otherwise in chest abdomen pelvis noted. 02/04/2023: CT CAP: Stable liver and bone mets, ST nodule L4  03/10/2023: MRI  L Spine: no mets, stable kyphoplasty at L4, deg disc changes 08/08/2023: CT CAP: Stable liver metastases, stable bone metastases 02/17/2024: CT CAP: Stable liver metastases, stable bone metastases L4 vertebral cement augmentation, small sclerotic lesion right ilium   Adverse effects: Diarrhea: Resolved Redness of hands and feet: Mild 3.   Fatigue: continues to be an  issue 4.  Difficulty staying asleep: We did briefly discussed different techniques to stay asleep.   Based on stable CT findings,we will continue current therapy Bone metastasis: Xgeva  scans will be in 3 months with for injection and labs and MD visit

## 2024-05-25 ENCOUNTER — Other Ambulatory Visit: Payer: Self-pay | Admitting: Hematology and Oncology

## 2024-08-22 ENCOUNTER — Inpatient Hospital Stay

## 2024-08-29 ENCOUNTER — Inpatient Hospital Stay: Admitting: Hematology and Oncology

## 2024-08-29 ENCOUNTER — Inpatient Hospital Stay
